# Patient Record
Sex: Male | Born: 1945 | Race: Black or African American | Hispanic: No | Marital: Single | Smoking: Current every day smoker
Health system: Southern US, Community
[De-identification: ages and names within clinical notes are randomized; demographics above are authoritative.]

## PROBLEM LIST (undated history)

## (undated) DIAGNOSIS — I82409 Acute embolism and thrombosis of unspecified deep veins of unspecified lower extremity: Secondary | ICD-10-CM

## (undated) DIAGNOSIS — I5022 Chronic systolic (congestive) heart failure: Secondary | ICD-10-CM

## (undated) DIAGNOSIS — M1990 Unspecified osteoarthritis, unspecified site: Secondary | ICD-10-CM

## (undated) DIAGNOSIS — Z95810 Presence of automatic (implantable) cardiac defibrillator: Secondary | ICD-10-CM

## (undated) DIAGNOSIS — I219 Acute myocardial infarction, unspecified: Secondary | ICD-10-CM

## (undated) HISTORY — PX: KNEE SURGERY: SHX244

---

## 2015-11-04 DIAGNOSIS — I428 Other cardiomyopathies: Secondary | ICD-10-CM

## 2015-11-04 HISTORY — DX: Other cardiomyopathies: I42.8

## 2015-11-04 HISTORY — PX: CARDIAC CATHETERIZATION: SHX172

## 2017-04-09 ENCOUNTER — Emergency Department (HOSPITAL_COMMUNITY): Payer: Medicare Other

## 2017-04-09 ENCOUNTER — Encounter (HOSPITAL_COMMUNITY): Payer: Self-pay | Admitting: Emergency Medicine

## 2017-04-09 ENCOUNTER — Emergency Department (HOSPITAL_COMMUNITY)
Admission: EM | Admit: 2017-04-09 | Discharge: 2017-04-10 | Disposition: A | Discharge status: Home / Self Care | Payer: Medicare Other | Attending: Emergency Medicine | Admitting: Emergency Medicine

## 2017-04-09 DIAGNOSIS — J449 Chronic obstructive pulmonary disease, unspecified: Secondary | ICD-10-CM

## 2017-04-09 DIAGNOSIS — E876 Hypokalemia: Secondary | ICD-10-CM | POA: Diagnosis not present

## 2017-04-09 DIAGNOSIS — F17210 Nicotine dependence, cigarettes, uncomplicated: Secondary | ICD-10-CM | POA: Diagnosis not present

## 2017-04-09 DIAGNOSIS — R05 Cough: Secondary | ICD-10-CM | POA: Diagnosis present

## 2017-04-09 DIAGNOSIS — Z7982 Long term (current) use of aspirin: Secondary | ICD-10-CM | POA: Diagnosis not present

## 2017-04-09 DIAGNOSIS — R059 Cough, unspecified: Secondary | ICD-10-CM

## 2017-04-09 HISTORY — DX: Acute myocardial infarction, unspecified: I21.9

## 2017-04-09 HISTORY — DX: Unspecified osteoarthritis, unspecified site: M19.90

## 2017-04-09 LAB — COMPREHENSIVE METABOLIC PANEL
ALBUMIN: 4.1 g/dL (ref 3.5–5.0)
ALT: 11 U/L — ABNORMAL LOW (ref 17–63)
AST: 14 U/L — AB (ref 15–41)
Alkaline Phosphatase: 72 U/L (ref 38–126)
Anion gap: 8 (ref 5–15)
BUN: 10 mg/dL (ref 6–20)
CHLORIDE: 101 mmol/L (ref 101–111)
CO2: 27 mmol/L (ref 22–32)
Calcium: 9.1 mg/dL (ref 8.9–10.3)
Creatinine, Ser: 1.29 mg/dL — ABNORMAL HIGH (ref 0.61–1.24)
GFR calc Af Amer: >60 mL/min (ref >60)
GFR calc non Af Amer: 55 mL/min — ABNORMAL LOW (ref >60)
GLUCOSE: 100 mg/dL — AB (ref 65–99)
POTASSIUM: 3.3 mmol/L — AB (ref 3.5–5.1)
Sodium: 136 mmol/L (ref 135–145)
Total Bilirubin: 0.5 mg/dL (ref 0.3–1.2)
Total Protein: 7.3 g/dL (ref 6.5–8.1)

## 2017-04-09 LAB — DIFFERENTIAL
BASOS PCT: 0 %
Basophils Absolute: 0 10*3/uL (ref 0.0–0.1)
EOS ABS: 0.2 10*3/uL (ref 0.0–0.7)
Eosinophils Relative: 3 %
Lymphocytes Relative: 35 %
Lymphs Abs: 2.4 10*3/uL (ref 0.7–4.0)
MONOS PCT: 10 %
Monocytes Absolute: 0.7 10*3/uL (ref 0.1–1.0)
NEUTROS ABS: 3.6 10*3/uL (ref 1.7–7.7)
Neutrophils Relative %: 52 %

## 2017-04-09 LAB — CBC
HEMATOCRIT: 39.5 % (ref 39.0–52.0)
Hemoglobin: 13.7 g/dL (ref 13.0–17.0)
MCH: 28.2 pg (ref 26.0–34.0)
MCHC: 34.7 g/dL (ref 30.0–36.0)
MCV: 81.4 fL (ref 78.0–100.0)
Platelets: 241 10*3/uL (ref 150–400)
RBC: 4.85 MIL/uL (ref 4.22–5.81)
RDW: 13.4 % (ref 11.5–15.5)
WBC: 6.6 10*3/uL (ref 4.0–10.5)

## 2017-04-09 LAB — LIPASE, BLOOD: LIPASE: 38 U/L (ref 11–51)

## 2017-04-09 MED ORDER — POTASSIUM CHLORIDE CRYS ER 20 MEQ PO TBCR
40.0000 meq | EXTENDED_RELEASE_TABLET | Freq: Once | ORAL | Status: AC
  Administered 2017-04-10: 40 meq via ORAL
  Filled 2017-04-09: qty 2

## 2017-04-09 MED ORDER — IPRATROPIUM-ALBUTEROL 0.5-2.5 (3) MG/3ML IN SOLN
3.0000 mL | Freq: Once | RESPIRATORY_TRACT | Status: AC
Start: 2017-04-09 — End: 2017-04-10
  Administered 2017-04-10: 3 mL via RESPIRATORY_TRACT
  Filled 2017-04-09: qty 3

## 2017-04-09 NOTE — ED Triage Notes (Signed)
Patient complaining of a cough that started 2 weeks ago. Patient states he had a heart attack three months ago but has not went back to the MD to get results. Patient is also complaining of abdominal pain that started after the cough.

## 2017-04-09 NOTE — ED Provider Notes (Signed)
[facility]-EMERGENCY DEPT Provider Note   CSN: [id number] Arrival date & time: 04/09/17  2119   By signing my name below, I, [name], attest that this documentation has been prepared under the direction and in the presence of [name], MD. Electronically signed, [name], ED Scribe. 04/09/17. 11:36 PM.   History   Chief Complaint Chief Complaint  Patient presents with  . Cough  . Abdominal Pain   The history is provided by the patient and medical records. No language interpreter was used.    Brandon Frye is a 71 y.o. male with h/o heart attack presenting to the Emergency Department with chief complaint of a gradually worsening, heavy, productive cough x ~3 months. He states his coughing spells have led to light-headedness, headaches, SOB and near syncope. He also notes lower abdominal pain, he states his cough wakes him at night and he reports a "scratch" sensation to the back of the throat. Pt has used an inhaler and cough syrup without relief to cough. Heart catheterization noted at [facility] in 10/2017. Pt placed on lisinopril on 02/11/2017. No fever, chills, sweats, nausea or vomiting noted. Pt followed at [facility] in [place] for primary care; he notes an upcoming F/U appointment in 06/2017.  Past Medical History:  Diagnosis Date  . Arthritis   . Heart attack (HCC)     There are no active problems to display for this patient.   History reviewed. No pertinent surgical history.     Home Medications    Prior to Admission medications   Medication Sig Start Date End Date Taking? Authorizing Provider  aspirin EC 81 MG tablet Take 81 mg by mouth daily.   Yes [provider]  atorvastatin (LIPITOR) 40 MG tablet Take 40 mg by mouth at bedtime.   Yes [provider]  carvedilol (COREG) 12.5 MG tablet Take 6.25 mg by mouth 2 (two) times daily with a meal.   Yes [provider]  dextromethorphan (DELSYM) 30 MG/5ML liquid Take 30 mg by mouth 2 (two)  times daily as needed for cough.   Yes [provider]  guaiFENesin (MUCINEX) 600 MG 12 hr tablet Take 600 mg by mouth 2 (two) times daily.   Yes [provider]  Ipratropium-Albuterol (COMBIVENT RESPIMAT) 20-100 MCG/ACT AERS respimat Inhale 1 puff into the lungs every 6 (six) hours as needed for wheezing or shortness of breath.   Yes [provider]  lisinopril (PRINIVIL,ZESTRIL) 5 MG tablet Take 5 mg by mouth daily.   Yes [provider]  loperamide (IMODIUM A-D) 2 MG tablet Take 2 mg by mouth 4 (four) times daily as needed for diarrhea or loose stools.   Yes [provider]  loratadine (CLARITIN) 10 MG tablet Take 10 mg by mouth daily as needed for allergies.   Yes [provider]  naproxen sodium (ANAPROX) 220 MG tablet Take 220 mg by mouth 2 (two) times daily as needed (pain).   Yes [provider]  nitroGLYCERIN (NITROSTAT) 0.4 MG SL tablet Place 0.4 mg under the tongue every 5 (five) minutes as needed for chest pain.   Yes [provider]    Family History History reviewed. No pertinent family history.  Social History Social History  Substance Use Topics  . Smoking status: Current Every Day Smoker    Packs/day: 1.00    Years: 25.00    Types: Cigarettes  . Smokeless tobacco: Never Used  . Alcohol use No     Allergies   Patient has  no known allergies.   Review of Systems Review of Systems  Constitutional: Negative for chills, diaphoresis and fever.  Respiratory: Positive for cough and shortness of breath. Negative for wheezing.   Gastrointestinal: Positive for abdominal pain. Negative for nausea and vomiting.  Neurological: Positive for light-headedness and headaches.  All other systems reviewed and are negative.    Physical Exam Updated Vital Signs BP 92/63 (BP Location: Left Arm)   Pulse 82   Temp 98.2 F (36.8 C) (Oral)   Resp 18   Ht 6\' 1"  (1.854 m)   Wt 185 lb (83.9 kg)   SpO2 95%   BMI  24.41 kg/m   Physical Exam  Constitutional: He is oriented to person, place, and time. He appears well-developed and well-nourished.  HENT:  Head: Normocephalic and atraumatic.  Eyes: EOM are normal. Pupils are equal, round, and reactive to light.  Neck: Normal range of motion. Neck supple. No JVD present.  Cardiovascular: Normal rate, regular rhythm and normal heart sounds.   No murmur heard. Pulmonary/Chest: Effort normal and breath sounds normal. He has no wheezes. He has no rales. He exhibits no tenderness.  Course expiratory rhonchi diffusely  Abdominal: Soft. Bowel sounds are normal. He exhibits no distension and no mass. There is no tenderness.  Musculoskeletal: Normal range of motion. He exhibits no edema.  Lymphadenopathy:    He has no cervical adenopathy.  Neurological: He is alert and oriented to person, place, and time. No cranial nerve deficit. He exhibits normal muscle tone. Coordination normal.  Skin: Skin is warm and dry. No rash noted.  Psychiatric: He has a normal mood and affect. His behavior is normal. Judgment and thought content normal.  Nursing note and vitals reviewed.    ED Treatments / Results  DIAGNOSTIC STUDIES: Oxygen Saturation is 95% on RA, adequate by my interpretation.    COORDINATION OF CARE: 11:32 PM-Discussed next steps with pt. Pt verbalized understanding and is agreeable with the plan. Will order potassium, breathing treatment and CXR. Pt advised of plan to change HTN medication and F/U with PCP.   Labs (all labs ordered are listed, but only abnormal results are displayed) Labs Reviewed  COMPREHENSIVE METABOLIC PANEL - Abnormal; Notable for the following:       Result Value   Potassium 3.3 (*)    Glucose, Bld 100 (*)    Creatinine, Ser 1.29 (*)    AST 14 (*)    ALT 11 (*)    GFR calc non Af Amer 55 (*)    All other components within normal limits  URINALYSIS, ROUTINE W REFLEX MICROSCOPIC - Abnormal; Notable for the following:     Squamous Epithelial / LPF 0-5 (*)    All other components within normal limits  LIPASE, BLOOD  CBC  DIFFERENTIAL    Radiology Dg Chest 2 View  Result Date: 04/09/2017 CLINICAL DATA:  Cough that started 2 weeks ago EXAM: CHEST  2 VIEW COMPARISON:  None. FINDINGS: Linear scarring or atelectasis at the bases. No focal consolidation. Probable pleural scarring at the left base. Normal heart size. Aortic atherosclerosis. No pneumothorax. Degenerative changes of the spine. IMPRESSION: Probable pleural scarring at the left lung base. No acute infiltrate. Electronically Signed   By: Jasmine Pang M.D.   On: 04/09/2017 23:58    Procedures Procedures (including critical care time)  Medications Ordered in ED Medications  predniSONE (DELTASONE) tablet 60 mg (not administered)  potassium chloride SA (K-DUR,KLOR-CON) CR tablet 40 mEq (40 mEq Oral Given 04/10/17  0010)  ipratropium-albuterol (DUONEB) 0.5-2.5 (3) MG/3ML nebulizer solution 3 mL (3 mLs Nebulization Given 04/10/17 0010)     Initial Impression / Assessment and Plan / ED Course  I have reviewed the triage vital signs and the nursing notes.  Pertinent labs & imaging results that were available during my care of the patient were reviewed by me and considered in my medical decision making (see chart for details).  Cough which may be from ACE inhibitor use. Patient thinks that he started taking lisinopril in April. However, on review of his prior records, [facility] note from a cardiac catheterization in December and lisinopril is listed on his medications at that time. However, it still may be the cause of his cough. Chest x-ray shows no evidence of pneumonia. He did have some rhonchi on exam. He was given albuterol with ipratropium via nebulizer and he states his lungs feel less tight, but he is still coughing. He does admit to continued smoking. I will put him on a short course of prednisone. He states he needs a new prescription for Combivent inhaler, and  that is given. He is also given a prescription for a small number of hydrocodone-acetaminophen tablets to use to suppress cough at bedtime. He is instructed to stop taking lisinopril and follow-up with PCP in one week. At that time, responds to medication adjustment can be assessed.  Final Clinical Impressions(s) / ED Diagnoses   Final diagnoses:  Cough  Chronic obstructive pulmonary disease, unspecified COPD type (HCC)  Hypokalemia    New Prescriptions New Prescriptions   HYDROCODONE-ACETAMINOPHEN (NORCO) 5-325 MG TABLET    Take 1 tablet by mouth every 4 (four) hours as needed for moderate pain (or coughing).   PREDNISONE (DELTASONE) 50 MG TABLET    Take 1 tablet (50 mg total) by mouth daily.   I personally performed the services described in this documentation, which was scribed in my presence. The recorded information has been reviewed and is accurate.       [name], MD 04/10/17 0111

## 2017-04-09 NOTE — ED Notes (Signed)
Pt is  Alert and oriented x 4 and is verbally responsive Pt reports RUQ/LUQ discomfort that worsens with recent persistent cough pt describes the pain as tightness. Pt reports having the cough since April, pt states that he has had increase dizziness and feels with some coughing episodes that he may pass out,  and reports recent cardiac events and was concerned the effects of his coughing on his heart.

## 2017-04-10 LAB — URINALYSIS, ROUTINE W REFLEX MICROSCOPIC
BILIRUBIN URINE: NEGATIVE
Bacteria, UA: NONE SEEN
Glucose, UA: NEGATIVE mg/dL
HGB URINE DIPSTICK: NEGATIVE
Ketones, ur: NEGATIVE mg/dL
Leukocytes, UA: NEGATIVE
NITRITE: NEGATIVE
PH: 5 (ref 5.0–8.0)
Protein, ur: NEGATIVE mg/dL
SPECIFIC GRAVITY, URINE: 1.005 (ref 1.005–1.030)

## 2017-04-10 MED ORDER — IPRATROPIUM-ALBUTEROL 20-100 MCG/ACT IN AERS: 1.0000 | INHALATION_SPRAY | Freq: Four times a day (QID) | RESPIRATORY_TRACT | 0 refills | Status: DC | PRN

## 2017-04-10 MED ORDER — PREDNISONE 20 MG PO TABS
60.0000 mg | ORAL_TABLET | Freq: Once | ORAL | Status: AC
  Administered 2017-04-10: 60 mg via ORAL
  Filled 2017-04-10: qty 3

## 2017-04-10 MED ORDER — HYDROCODONE-ACETAMINOPHEN 5-325 MG PO TABS: 1.0000 | ORAL_TABLET | ORAL | 0 refills | Status: DC | PRN

## 2017-04-10 MED ORDER — PREDNISONE 50 MG PO TABS: 50.0000 mg | ORAL_TABLET | Freq: Every day | ORAL | 0 refills | Status: DC

## 2017-04-10 NOTE — Discharge Instructions (Signed)
Stop taking Lisinopril - it may be causing your cough. If your cough gets better, that is what is causing your cough. If your cough does not get better after being off of it for a week, then something else is causing it. Either way, you need to talk with your primary care provider in about a week to discuss your medications.

## 2019-08-19 ENCOUNTER — Encounter (HOSPITAL_COMMUNITY): Payer: Self-pay | Admitting: Emergency Medicine

## 2019-08-19 ENCOUNTER — Emergency Department (HOSPITAL_COMMUNITY)
Admission: EM | Admit: 2019-08-19 | Discharge: 2019-08-19 | Disposition: A | Discharge status: Home / Self Care | Payer: Medicare PPO | Attending: Emergency Medicine | Admitting: Emergency Medicine

## 2019-08-19 ENCOUNTER — Other Ambulatory Visit: Payer: Self-pay

## 2019-08-19 ENCOUNTER — Emergency Department (HOSPITAL_COMMUNITY): Payer: Medicare PPO

## 2019-08-19 DIAGNOSIS — R0600 Dyspnea, unspecified: Secondary | ICD-10-CM | POA: Diagnosis not present

## 2019-08-19 DIAGNOSIS — Z86718 Personal history of other venous thrombosis and embolism: Secondary | ICD-10-CM | POA: Diagnosis not present

## 2019-08-19 DIAGNOSIS — I10 Essential (primary) hypertension: Secondary | ICD-10-CM

## 2019-08-19 DIAGNOSIS — R600 Localized edema: Secondary | ICD-10-CM | POA: Diagnosis not present

## 2019-08-19 DIAGNOSIS — Z7982 Long term (current) use of aspirin: Secondary | ICD-10-CM | POA: Insufficient documentation

## 2019-08-19 DIAGNOSIS — I252 Old myocardial infarction: Secondary | ICD-10-CM | POA: Insufficient documentation

## 2019-08-19 DIAGNOSIS — Z7901 Long term (current) use of anticoagulants: Secondary | ICD-10-CM | POA: Insufficient documentation

## 2019-08-19 DIAGNOSIS — F17210 Nicotine dependence, cigarettes, uncomplicated: Secondary | ICD-10-CM | POA: Insufficient documentation

## 2019-08-19 DIAGNOSIS — Z79899 Other long term (current) drug therapy: Secondary | ICD-10-CM | POA: Diagnosis not present

## 2019-08-19 DIAGNOSIS — Z20828 Contact with and (suspected) exposure to other viral communicable diseases: Secondary | ICD-10-CM | POA: Diagnosis not present

## 2019-08-19 LAB — CBC WITH DIFFERENTIAL/PLATELET
Abs Immature Granulocytes: 0.02 10*3/uL (ref 0.00–0.07)
Basophils Absolute: 0 10*3/uL (ref 0.0–0.1)
Basophils Relative: 1 %
Eosinophils Absolute: 0.2 10*3/uL (ref 0.0–0.5)
Eosinophils Relative: 3 %
HCT: 53.7 % — ABNORMAL HIGH (ref 39.0–52.0)
Hemoglobin: 17.5 g/dL — ABNORMAL HIGH (ref 13.0–17.0)
Immature Granulocytes: 0 %
Lymphocytes Relative: 21 %
Lymphs Abs: 1.1 10*3/uL (ref 0.7–4.0)
MCH: 30.1 pg (ref 26.0–34.0)
MCHC: 32.6 g/dL (ref 30.0–36.0)
MCV: 92.4 fL (ref 80.0–100.0)
Monocytes Absolute: 0.6 10*3/uL (ref 0.1–1.0)
Monocytes Relative: 12 %
Neutro Abs: 3.3 10*3/uL (ref 1.7–7.7)
Neutrophils Relative %: 63 %
Platelets: 242 10*3/uL (ref 150–400)
RBC: 5.81 MIL/uL (ref 4.22–5.81)
RDW: 15.1 % (ref 11.5–15.5)
WBC: 5.2 10*3/uL (ref 4.0–10.5)
nRBC: 0 % (ref 0.0–0.2)

## 2019-08-19 LAB — I-STAT CHEM 8, ED
BUN: 11 mg/dL (ref 8–23)
Calcium, Ion: 1.13 mmol/L — ABNORMAL LOW (ref 1.15–1.40)
Chloride: 102 mmol/L (ref 98–111)
Creatinine, Ser: 1 mg/dL (ref 0.61–1.24)
Glucose, Bld: 128 mg/dL — ABNORMAL HIGH (ref 70–99)
HCT: 56 % — ABNORMAL HIGH (ref 39.0–52.0)
Hemoglobin: 19 g/dL — ABNORMAL HIGH (ref 13.0–17.0)
Potassium: 3.9 mmol/L (ref 3.5–5.1)
Sodium: 141 mmol/L (ref 135–145)
TCO2: 29 mmol/L (ref 22–32)

## 2019-08-19 LAB — LIPID PANEL
Cholesterol: 176 mg/dL (ref 0–200)
HDL: 76 mg/dL (ref >40)
LDL Cholesterol: 86 mg/dL (ref 0–99)
Total CHOL/HDL Ratio: 2.3 RATIO
Triglycerides: 71 mg/dL (ref <150)
VLDL: 14 mg/dL (ref 0–40)

## 2019-08-19 LAB — COMPREHENSIVE METABOLIC PANEL
ALT: 32 U/L (ref 0–44)
AST: 42 U/L — ABNORMAL HIGH (ref 15–41)
Albumin: 4 g/dL (ref 3.5–5.0)
Alkaline Phosphatase: 77 U/L (ref 38–126)
Anion gap: 10 (ref 5–15)
BUN: 9 mg/dL (ref 8–23)
CO2: 26 mmol/L (ref 22–32)
Calcium: 8.8 mg/dL — ABNORMAL LOW (ref 8.9–10.3)
Chloride: 103 mmol/L (ref 98–111)
Creatinine, Ser: 1.07 mg/dL (ref 0.61–1.24)
GFR calc Af Amer: >60 mL/min (ref >60)
GFR calc non Af Amer: >60 mL/min (ref >60)
Glucose, Bld: 130 mg/dL — ABNORMAL HIGH (ref 70–99)
Potassium: 3.9 mmol/L (ref 3.5–5.1)
Sodium: 139 mmol/L (ref 135–145)
Total Bilirubin: 0.8 mg/dL (ref 0.3–1.2)
Total Protein: 7 g/dL (ref 6.5–8.1)

## 2019-08-19 LAB — SARS CORONAVIRUS 2 BY RT PCR (HOSPITAL ORDER, PERFORMED IN ~~LOC~~ HOSPITAL LAB): SARS Coronavirus 2: NEGATIVE

## 2019-08-19 LAB — APTT: aPTT: 45 seconds — ABNORMAL HIGH (ref 24–36)

## 2019-08-19 LAB — TROPONIN I (HIGH SENSITIVITY)
Troponin I (High Sensitivity): 23 ng/L — ABNORMAL HIGH (ref <18)
Troponin I (High Sensitivity): 21 ng/L — ABNORMAL HIGH (ref <18)

## 2019-08-19 LAB — PROTIME-INR
INR: 2 — ABNORMAL HIGH (ref 0.8–1.2)
Prothrombin Time: 22.2 seconds — ABNORMAL HIGH (ref 11.4–15.2)

## 2019-08-19 SURGERY — CORONARY/GRAFT ACUTE MI REVASCULARIZATION
Anesthesia: LOCAL

## 2019-08-19 MED ORDER — ASPIRIN 81 MG PO CHEW
324.0000 mg | CHEWABLE_TABLET | Freq: Once | ORAL | Status: AC
  Administered 2019-08-19: 324 mg via ORAL

## 2019-08-19 MED ORDER — IOHEXOL 350 MG/ML SOLN
100.0000 mL | Freq: Once | INTRAVENOUS | Status: AC | PRN
  Administered 2019-08-19: 100 mL via INTRAVENOUS

## 2019-08-19 MED ORDER — HEPARIN (PORCINE) 25000 UT/250ML-% IV SOLN
1000.0000 [IU]/h | INTRAVENOUS | Status: DC
  Filled 2019-08-19 (×2): qty 250

## 2019-08-19 MED ORDER — HEPARIN SODIUM (PORCINE) 5000 UNIT/ML IJ SOLN: 60.0000 [IU]/kg | Freq: Once | INTRAMUSCULAR | Status: DC

## 2019-08-19 MED ORDER — METOPROLOL TARTRATE 5 MG/5ML IV SOLN
5.0000 mg | Freq: Once | INTRAVENOUS | Status: AC
  Administered 2019-08-19: 13:00:00 5 mg via INTRAVENOUS
  Filled 2019-08-19: qty 5

## 2019-08-19 MED ORDER — SODIUM CHLORIDE 0.9 % IV SOLN
INTRAVENOUS | Status: DC
  Administered 2019-08-19: 10:00:00 via INTRAVENOUS

## 2019-08-19 MED ORDER — ASPIRIN 81 MG PO CHEW
CHEWABLE_TABLET | ORAL | Status: AC
  Filled 2019-08-19: qty 3

## 2019-08-19 MED ORDER — ASPIRIN 81 MG PO CHEW: 324.0000 mg | CHEWABLE_TABLET | Freq: Once | ORAL | Status: DC

## 2019-08-19 NOTE — ED Notes (Signed)
Pt stood and used urinal at bedside. Pt endorses shob by the time he was finished urinating, helped back into bed.

## 2019-08-19 NOTE — ED Provider Notes (Signed)
[facility] EMERGENCY DEPARTMENT Provider Note   CSN: [id number] Arrival date & time: 08/19/19  5916     History   Chief Complaint Chief Complaint  Patient presents with  . Code STEMI    HPI Brandon Frye is a 73 y.o. male.     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  Denies symptoms prior to going to bed.  HO MI several years ago in [place]- states he did not know he was having an MI but was told he had one.  He noted some dyspnea at rest at 3 AM.  However, when he got up to go to the bathroom it was worsening.  He denies ever having any similar symptoms in the past.  He has ongoing left lower extremity swelling with dilation of the veins and states unchanged from prior.  He reports taking his Xarelto as prescribed.  Past Medical History:  Diagnosis Date  . Arthritis   . Heart attack ([place])     There are no active problems to display for this patient.   History reviewed. No pertinent surgical history.      Home Medications    Prior to Admission medications   Medication Sig Start Date End Date Taking? Authorizing Provider  aspirin EC 81 MG tablet Take 81 mg by mouth daily.    [provider]  atorvastatin (LIPITOR) 40 MG tablet Take 40 mg by mouth at bedtime.    [provider]  carvedilol (COREG) 12.5 MG tablet Take 6.25 mg by mouth 2 (two) times daily with a meal.    [provider]  dextromethorphan (DELSYM) 30 MG/5ML liquid Take 30 mg by mouth 2 (two) times daily as needed for cough.    [provider]  guaiFENesin (MUCINEX) 600 MG 12 hr tablet Take 600 mg by mouth 2 (two) times daily.    [provider]  HYDROcodone-acetaminophen (NORCO) 5-325 MG tablet Take 1 tablet by mouth every 4 (four) hours as needed for moderate pain (or coughing). 01/08/45   Delora Fuel, MD  Ipratropium-Albuterol (COMBIVENT RESPIMAT) 20-100 MCG/ACT AERS respimat Inhale 1 puff into the lungs  every 6 (six) hours as needed for wheezing or shortness of breath (or coughing). 04/07/98   Delora Fuel, MD  loperamide (IMODIUM A-D) 2 MG tablet Take 2 mg by mouth 4 (four) times daily as needed for diarrhea or loose stools.    [provider]  loratadine (CLARITIN) 10 MG tablet Take 10 mg by mouth daily as needed for allergies.    [provider]  naproxen sodium (ANAPROX) 220 MG tablet Take 220 mg by mouth 2 (two) times daily as needed (pain).    [provider]  nitroGLYCERIN (NITROSTAT) 0.4 MG SL tablet Place 0.4 mg under the tongue every 5 (five) minutes as needed for chest pain.    [provider]  predniSONE (DELTASONE) 50 MG tablet Take 1 tablet (50 mg total) by mouth daily. 01/05/69   Delora Fuel, MD    Family History No family history on file.  Social History Social History   Tobacco Use  . Smoking status: Current Every Day Smoker    Packs/day: 1.00    Years: 25.00    Pack years: 25.00    Types: Cigarettes  . Smokeless tobacco: Never Used  Substance Use Topics  . Alcohol use: No  . Drug use: No     Allergies   Patient has no known  allergies.   Review of Systems Review of Systems  All other systems reviewed and are negative.    Physical Exam Updated Vital Signs BP (!) 182/118   Pulse (!) 102   Temp 98.6 F (37 C)   Resp 20   SpO2 98%   Physical Exam Vitals signs and nursing note reviewed.  Constitutional:      General: He is not in acute distress.    Appearance: Normal appearance. He is normal weight.  HENT:     Right Ear: External ear normal.     Left Ear: External ear normal.     Nose: Nose normal.     Mouth/Throat:     Pharynx: Oropharynx is clear.  Eyes:     Pupils: Pupils are equal, round, and reactive to light.  Neck:     Musculoskeletal: Normal range of motion.  Cardiovascular:     Rate and Rhythm: Normal rate and regular rhythm.  Pulmonary:     Effort: Pulmonary effort is normal.     Breath sounds:  Normal breath sounds.  Abdominal:     General: Abdomen is flat.     Palpations: Abdomen is soft.  Musculoskeletal: Normal range of motion.        General: Swelling present.     Comments: Left lower extremity with calf swelling relative to the right with prominent veins noted No wounds noted Dorsal pedal is pulses intact  Skin:    General: Skin is dry.     Capillary Refill: Capillary refill takes less than 2 seconds.  Neurological:     General: No focal deficit present.     Mental Status: He is alert.  Psychiatric:        Mood and Affect: Mood normal.      ED Treatments / Results  Labs (all labs ordered are listed, but only abnormal results are displayed) Labs Reviewed  CBC WITH DIFFERENTIAL/PLATELET  COMPREHENSIVE METABOLIC PANEL    EKG EKG Interpretation  Date/Time:  Friday August 19 2019 09:10:40 EDT Ventricular Rate:  93 PR Interval:  154 QRS Duration: 116 QT Interval:  392 QTC Calculation: 487 R Axis:   26 Text Interpretation:  Sinus rhythm with Premature atrial complexes with Abberant conduction Right atrial enlargement Left ventricular hypertrophy with QRS widening ( Sokolow-Lyon , Cornell product ) Prolonged QT Abnormal ECG st elevation v1-v4 likely lvh  cannot rule out stemi- no old to compare Confirmed by Margarita Grizzle [phone]) on 08/19/2019 9:15:04 AM   Radiology Ct Angio Chest Pe W And/or Wo Contrast  Result Date: 08/19/2019 CLINICAL DATA:  Cough, shortness of breath EXAM: CT ANGIOGRAPHY CHEST WITH CONTRAST TECHNIQUE: Multidetector CT imaging of the chest was performed using the standard protocol during bolus administration of intravenous contrast. Multiplanar CT image reconstructions and MIPs were obtained to evaluate the vascular anatomy. CONTRAST:  OMNIPAQUE IOHEXOL 350 MG/ML SOLN COMPARISON:  Chest x-Taniah Reinecke today FINDINGS: Cardiovascular: No filling defects in the pulmonary arteries to suggest pulmonary emboli. Heart is borderline in size. Scattered  coronary artery and aortic calcifications. No aneurysm. Mediastinum/Nodes: No mediastinal, hilar, or axillary adenopathy. Trachea and esophagus are unremarkable. Thyroid unremarkable. Lungs/Pleura: Linear areas of scarring in the lingula and left base. No pleural effusions. No confluent opacities. Upper Abdomen: Imaging into the upper abdomen shows no acute findings. Musculoskeletal: Chest wall soft tissues are unremarkable. No acute bony abnormality. Review of the MIP images confirms the above findings. IMPRESSION: No evidence of pulmonary embolus. Borderline heart size.  Coronary artery disease. Lingular/left  basilar scarring. Aortic Atherosclerosis (ICD10-I70.0). Electronically Signed   By: Charlett Nose M.D.   On: 08/19/2019 12:11   Dg Chest Port 1 View  Result Date: 08/19/2019 CLINICAL DATA:  Shortness of breath EXAM: PORTABLE CHEST 1 VIEW COMPARISON:  April 09, 2017 FINDINGS: There is no edema or consolidation. There is a minimal left pleural effusion. Heart is upper normal in size with pulmonary vascularity normal. No adenopathy. No bone lesions. There is aortic atherosclerosis. IMPRESSION: Minimal left pleural effusion. No edema or consolidation. Heart upper normal in size. Aortic Atherosclerosis (ICD10-I70.0). Electronically Signed   By: Bretta Bang III M.D.   On: 08/19/2019 09:37    Procedures Procedures (including critical care time)  Medications Ordered in ED Medications - No data to display   Initial Impression / Assessment and Plan / ED Course  I have reviewed the triage vital signs and the nursing notes.  Pertinent labs & imaging results that were available during my care of the patient were reviewed by me and considered in my medical decision making (see chart for details).    74 year old male history of coronary disease and DVT yesterday with increased dyspnea over the past 1 to 2 weeks.  EKG initially evaluated at ST elevation without old EKGs.  Initially initiated as a code  STEMI.  Reviewed with Dr. [place] and code STEMI canceled as agreed that it appears to be LVH.  Patient has been pain-free here.  He has been evaluated for CHF, pulmonary embolism, and MI.  There is no evidence of pulmonary embolism.  He does have a mild left pleural effusion noted on chest x-Jakera Beaupre but it is not present on CT angio.  Patient has second troponin pending.  He is hypertensive here in the ED.  Has not taken some medications.  He is given Lopressor IV.  Once repeat troponin has resulted, will ambulate if negative.  Patient stable will consider discharge to home   Patient ambulated without difficulty.  Discussed return precautions and need for follow up. Final Clinical Impressions(s) / ED Diagnoses   Final diagnoses:  Dyspnea, unspecified type  Hypertension, unspecified type    ED Discharge Orders    None       Margarita Grizzle, MD 08/19/19 1343

## 2019-08-19 NOTE — Discharge Instructions (Addendum)
Please return if you are worse at any time. Recheck with your doctor next week.

## 2019-08-19 NOTE — ED Notes (Signed)
Ambulated with pt in hallway, denies sob or cp during ambulation, does not require assistive device or SBA from staff.

## 2019-08-19 NOTE — ED Notes (Signed)
[facility]) called @ 0912-per Dr. Jeanell Sparrow called by Levada Dy

## 2019-08-19 NOTE — ED Triage Notes (Signed)
Pt reports sob that awoke him around 3 am, states he felt fine when he went to bed last night. Reports a productive cough with white phlegm initially. Denies fevers,chills, sore throat or chest pain, denies any recent sick contacts. resp minimally labored.

## 2019-12-21 ENCOUNTER — Emergency Department (HOSPITAL_COMMUNITY): Payer: No Typology Code available for payment source

## 2019-12-21 ENCOUNTER — Encounter (HOSPITAL_COMMUNITY): Payer: Self-pay

## 2019-12-21 ENCOUNTER — Inpatient Hospital Stay (HOSPITAL_COMMUNITY)
Admission: EM | Admit: 2019-12-21 | Discharge: 2019-12-27 | DRG: 265 | Disposition: A | Discharge status: Home / Self Care | Payer: No Typology Code available for payment source | Source: Ambulatory Visit | Attending: Internal Medicine | Admitting: Internal Medicine

## 2019-12-21 ENCOUNTER — Other Ambulatory Visit: Payer: Self-pay

## 2019-12-21 DIAGNOSIS — E876 Hypokalemia: Secondary | ICD-10-CM | POA: Diagnosis present

## 2019-12-21 DIAGNOSIS — I509 Heart failure, unspecified: Secondary | ICD-10-CM | POA: Diagnosis not present

## 2019-12-21 DIAGNOSIS — I472 Ventricular tachycardia: Secondary | ICD-10-CM | POA: Diagnosis present

## 2019-12-21 DIAGNOSIS — I5030 Unspecified diastolic (congestive) heart failure: Secondary | ICD-10-CM

## 2019-12-21 DIAGNOSIS — I4729 Other ventricular tachycardia: Secondary | ICD-10-CM

## 2019-12-21 DIAGNOSIS — I110 Hypertensive heart disease with heart failure: Secondary | ICD-10-CM | POA: Diagnosis not present

## 2019-12-21 DIAGNOSIS — R911 Solitary pulmonary nodule: Secondary | ICD-10-CM | POA: Diagnosis present

## 2019-12-21 DIAGNOSIS — J449 Chronic obstructive pulmonary disease, unspecified: Secondary | ICD-10-CM | POA: Diagnosis present

## 2019-12-21 DIAGNOSIS — Z7952 Long term (current) use of systemic steroids: Secondary | ICD-10-CM

## 2019-12-21 DIAGNOSIS — I429 Cardiomyopathy, unspecified: Secondary | ICD-10-CM

## 2019-12-21 DIAGNOSIS — Z20822 Contact with and (suspected) exposure to covid-19: Secondary | ICD-10-CM | POA: Diagnosis present

## 2019-12-21 DIAGNOSIS — Z7982 Long term (current) use of aspirin: Secondary | ICD-10-CM

## 2019-12-21 DIAGNOSIS — Z8249 Family history of ischemic heart disease and other diseases of the circulatory system: Secondary | ICD-10-CM

## 2019-12-21 DIAGNOSIS — I2510 Atherosclerotic heart disease of native coronary artery without angina pectoris: Secondary | ICD-10-CM | POA: Diagnosis present

## 2019-12-21 DIAGNOSIS — Z823 Family history of stroke: Secondary | ICD-10-CM

## 2019-12-21 DIAGNOSIS — Z79899 Other long term (current) drug therapy: Secondary | ICD-10-CM

## 2019-12-21 DIAGNOSIS — Z959 Presence of cardiac and vascular implant and graft, unspecified: Secondary | ICD-10-CM

## 2019-12-21 DIAGNOSIS — I160 Hypertensive urgency: Secondary | ICD-10-CM | POA: Diagnosis present

## 2019-12-21 DIAGNOSIS — Z7901 Long term (current) use of anticoagulants: Secondary | ICD-10-CM

## 2019-12-21 DIAGNOSIS — R55 Syncope and collapse: Secondary | ICD-10-CM | POA: Diagnosis present

## 2019-12-21 DIAGNOSIS — Z86718 Personal history of other venous thrombosis and embolism: Secondary | ICD-10-CM

## 2019-12-21 DIAGNOSIS — F17210 Nicotine dependence, cigarettes, uncomplicated: Secondary | ICD-10-CM | POA: Diagnosis present

## 2019-12-21 DIAGNOSIS — I5043 Acute on chronic combined systolic (congestive) and diastolic (congestive) heart failure: Secondary | ICD-10-CM

## 2019-12-21 DIAGNOSIS — I5082 Biventricular heart failure: Secondary | ICD-10-CM | POA: Diagnosis present

## 2019-12-21 DIAGNOSIS — M1990 Unspecified osteoarthritis, unspecified site: Secondary | ICD-10-CM | POA: Diagnosis present

## 2019-12-21 HISTORY — DX: Chronic systolic (congestive) heart failure: I50.22

## 2019-12-21 LAB — CBC
HCT: 49.5 % (ref 39.0–52.0)
Hemoglobin: 16.6 g/dL (ref 13.0–17.0)
MCH: 30.2 pg (ref 26.0–34.0)
MCHC: 33.5 g/dL (ref 30.0–36.0)
MCV: 90 fL (ref 80.0–100.0)
Platelets: 194 10*3/uL (ref 150–400)
RBC: 5.5 MIL/uL (ref 4.22–5.81)
RDW: 13.4 % (ref 11.5–15.5)
WBC: 6.1 10*3/uL (ref 4.0–10.5)
nRBC: 0 % (ref 0.0–0.2)

## 2019-12-21 LAB — BASIC METABOLIC PANEL
Anion gap: 10 (ref 5–15)
BUN: 10 mg/dL (ref 8–23)
CO2: 26 mmol/L (ref 22–32)
Calcium: 9.2 mg/dL (ref 8.9–10.3)
Chloride: 104 mmol/L (ref 98–111)
Creatinine, Ser: 1.03 mg/dL (ref 0.61–1.24)
GFR calc Af Amer: >60 mL/min (ref >60)
GFR calc non Af Amer: >60 mL/min (ref >60)
Glucose, Bld: 104 mg/dL — ABNORMAL HIGH (ref 70–99)
Potassium: 3.7 mmol/L (ref 3.5–5.1)
Sodium: 140 mmol/L (ref 135–145)

## 2019-12-21 LAB — TROPONIN I (HIGH SENSITIVITY)
Troponin I (High Sensitivity): 28 ng/L — ABNORMAL HIGH (ref <18)
Troponin I (High Sensitivity): 29 ng/L — ABNORMAL HIGH (ref <18)

## 2019-12-21 MED ORDER — SODIUM CHLORIDE 0.9% FLUSH: 3.0000 mL | Freq: Once | INTRAVENOUS | Status: DC

## 2019-12-21 NOTE — ED Triage Notes (Signed)
Pt presents with SOB x2 weeks, denies CP right now. Pt reports SOB worsens w/exertion and at night. Dr. From [place] sent him here for further evaluation

## 2019-12-22 ENCOUNTER — Emergency Department (HOSPITAL_COMMUNITY): Payer: No Typology Code available for payment source

## 2019-12-22 ENCOUNTER — Observation Stay (HOSPITAL_COMMUNITY): Payer: No Typology Code available for payment source

## 2019-12-22 ENCOUNTER — Encounter (HOSPITAL_COMMUNITY): Payer: Self-pay | Admitting: Radiology

## 2019-12-22 DIAGNOSIS — E876 Hypokalemia: Secondary | ICD-10-CM | POA: Diagnosis present

## 2019-12-22 DIAGNOSIS — I160 Hypertensive urgency: Secondary | ICD-10-CM | POA: Diagnosis present

## 2019-12-22 DIAGNOSIS — I5030 Unspecified diastolic (congestive) heart failure: Secondary | ICD-10-CM

## 2019-12-22 DIAGNOSIS — I472 Ventricular tachycardia: Secondary | ICD-10-CM | POA: Diagnosis present

## 2019-12-22 DIAGNOSIS — I509 Heart failure, unspecified: Secondary | ICD-10-CM | POA: Diagnosis present

## 2019-12-22 DIAGNOSIS — I2510 Atherosclerotic heart disease of native coronary artery without angina pectoris: Secondary | ICD-10-CM | POA: Diagnosis present

## 2019-12-22 DIAGNOSIS — J449 Chronic obstructive pulmonary disease, unspecified: Secondary | ICD-10-CM | POA: Diagnosis present

## 2019-12-22 DIAGNOSIS — I5082 Biventricular heart failure: Secondary | ICD-10-CM | POA: Diagnosis present

## 2019-12-22 DIAGNOSIS — R911 Solitary pulmonary nodule: Secondary | ICD-10-CM | POA: Diagnosis present

## 2019-12-22 DIAGNOSIS — R55 Syncope and collapse: Secondary | ICD-10-CM | POA: Diagnosis present

## 2019-12-22 DIAGNOSIS — Z823 Family history of stroke: Secondary | ICD-10-CM | POA: Diagnosis not present

## 2019-12-22 DIAGNOSIS — M1990 Unspecified osteoarthritis, unspecified site: Secondary | ICD-10-CM | POA: Diagnosis present

## 2019-12-22 DIAGNOSIS — Z8249 Family history of ischemic heart disease and other diseases of the circulatory system: Secondary | ICD-10-CM | POA: Diagnosis not present

## 2019-12-22 DIAGNOSIS — Z7952 Long term (current) use of systemic steroids: Secondary | ICD-10-CM | POA: Diagnosis not present

## 2019-12-22 DIAGNOSIS — I110 Hypertensive heart disease with heart failure: Secondary | ICD-10-CM | POA: Diagnosis present

## 2019-12-22 DIAGNOSIS — I5043 Acute on chronic combined systolic (congestive) and diastolic (congestive) heart failure: Secondary | ICD-10-CM | POA: Diagnosis present

## 2019-12-22 DIAGNOSIS — F17210 Nicotine dependence, cigarettes, uncomplicated: Secondary | ICD-10-CM | POA: Diagnosis present

## 2019-12-22 DIAGNOSIS — Z79899 Other long term (current) drug therapy: Secondary | ICD-10-CM | POA: Diagnosis not present

## 2019-12-22 DIAGNOSIS — Z7982 Long term (current) use of aspirin: Secondary | ICD-10-CM | POA: Diagnosis not present

## 2019-12-22 DIAGNOSIS — Z20822 Contact with and (suspected) exposure to covid-19: Secondary | ICD-10-CM | POA: Diagnosis present

## 2019-12-22 DIAGNOSIS — Z7901 Long term (current) use of anticoagulants: Secondary | ICD-10-CM | POA: Diagnosis not present

## 2019-12-22 DIAGNOSIS — I5023 Acute on chronic systolic (congestive) heart failure: Secondary | ICD-10-CM | POA: Diagnosis not present

## 2019-12-22 DIAGNOSIS — I429 Cardiomyopathy, unspecified: Secondary | ICD-10-CM | POA: Diagnosis not present

## 2019-12-22 DIAGNOSIS — I4729 Other ventricular tachycardia: Secondary | ICD-10-CM

## 2019-12-22 DIAGNOSIS — Z86718 Personal history of other venous thrombosis and embolism: Secondary | ICD-10-CM | POA: Diagnosis not present

## 2019-12-22 LAB — CBC WITH DIFFERENTIAL/PLATELET
Abs Immature Granulocytes: 0.01 10*3/uL (ref 0.00–0.07)
Basophils Absolute: 0 10*3/uL (ref 0.0–0.1)
Basophils Relative: 1 %
Eosinophils Absolute: 0.1 10*3/uL (ref 0.0–0.5)
Eosinophils Relative: 2 %
HCT: 49.3 % (ref 39.0–52.0)
Hemoglobin: 16.4 g/dL (ref 13.0–17.0)
Immature Granulocytes: 0 %
Lymphocytes Relative: 27 %
Lymphs Abs: 1.3 10*3/uL (ref 0.7–4.0)
MCH: 30.2 pg (ref 26.0–34.0)
MCHC: 33.3 g/dL (ref 30.0–36.0)
MCV: 90.8 fL (ref 80.0–100.0)
Monocytes Absolute: 0.7 10*3/uL (ref 0.1–1.0)
Monocytes Relative: 15 %
Neutro Abs: 2.6 10*3/uL (ref 1.7–7.7)
Neutrophils Relative %: 55 %
Platelets: 182 10*3/uL (ref 150–400)
RBC: 5.43 MIL/uL (ref 4.22–5.81)
RDW: 13.5 % (ref 11.5–15.5)
WBC: 4.7 10*3/uL (ref 4.0–10.5)
nRBC: 0 % (ref 0.0–0.2)

## 2019-12-22 LAB — HEPATIC FUNCTION PANEL
ALT: 28 U/L (ref 0–44)
AST: 32 U/L (ref 15–41)
Albumin: 3.1 g/dL — ABNORMAL LOW (ref 3.5–5.0)
Alkaline Phosphatase: 62 U/L (ref 38–126)
Bilirubin, Direct: 0.3 mg/dL — ABNORMAL HIGH (ref 0.0–0.2)
Indirect Bilirubin: 0.9 mg/dL (ref 0.3–0.9)
Total Bilirubin: 1.2 mg/dL (ref 0.3–1.2)
Total Protein: 6.6 g/dL (ref 6.5–8.1)

## 2019-12-22 LAB — MAGNESIUM: Magnesium: 1.5 mg/dL — ABNORMAL LOW (ref 1.7–2.4)

## 2019-12-22 LAB — BASIC METABOLIC PANEL
Anion gap: 9 (ref 5–15)
BUN: 11 mg/dL (ref 8–23)
CO2: 31 mmol/L (ref 22–32)
Calcium: 8.9 mg/dL (ref 8.9–10.3)
Chloride: 99 mmol/L (ref 98–111)
Creatinine, Ser: 1.05 mg/dL (ref 0.61–1.24)
GFR calc Af Amer: >60 mL/min (ref >60)
GFR calc non Af Amer: >60 mL/min (ref >60)
Glucose, Bld: 122 mg/dL — ABNORMAL HIGH (ref 70–99)
Potassium: 3.3 mmol/L — ABNORMAL LOW (ref 3.5–5.1)
Sodium: 139 mmol/L (ref 135–145)

## 2019-12-22 LAB — SARS CORONAVIRUS 2 (TAT 6-24 HRS): SARS Coronavirus 2: NEGATIVE

## 2019-12-22 LAB — BRAIN NATRIURETIC PEPTIDE: B Natriuretic Peptide: 992.9 pg/mL — ABNORMAL HIGH (ref 0.0–100.0)

## 2019-12-22 MED ORDER — RIVAROXABAN 10 MG PO TABS
10.0000 mg | ORAL_TABLET | Freq: Every day | ORAL | Status: DC
  Administered 2019-12-22: 10 mg via ORAL
  Filled 2019-12-22 (×2): qty 1

## 2019-12-22 MED ORDER — POTASSIUM CHLORIDE CRYS ER 20 MEQ PO TBCR
40.0000 meq | EXTENDED_RELEASE_TABLET | Freq: Once | ORAL | Status: AC
  Administered 2019-12-22: 40 meq via ORAL
  Filled 2019-12-22: qty 2

## 2019-12-22 MED ORDER — IPRATROPIUM-ALBUTEROL 0.5-2.5 (3) MG/3ML IN SOLN: 3.0000 mL | Freq: Four times a day (QID) | RESPIRATORY_TRACT | Status: DC | PRN

## 2019-12-22 MED ORDER — PREDNISONE 1 MG PO TABS
4.0000 mg | ORAL_TABLET | Freq: Every day | ORAL | Status: DC
  Administered 2019-12-22 – 2019-12-27 (×5): 4 mg via ORAL
  Filled 2019-12-22 (×6): qty 4

## 2019-12-22 MED ORDER — FUROSEMIDE 10 MG/ML IJ SOLN
40.0000 mg | Freq: Two times a day (BID) | INTRAMUSCULAR | Status: DC
  Administered 2019-12-22 – 2019-12-23 (×2): 40 mg via INTRAVENOUS
  Filled 2019-12-22 (×2): qty 4

## 2019-12-22 MED ORDER — MAGNESIUM SULFATE 4 GM/100ML IV SOLN
4.0000 g | Freq: Once | INTRAVENOUS | Status: AC
  Administered 2019-12-22: 4 g via INTRAVENOUS
  Filled 2019-12-22: qty 100

## 2019-12-22 MED ORDER — SODIUM CHLORIDE 0.9 % IV SOLN: 250.0000 mL | INTRAVENOUS | Status: DC | PRN

## 2019-12-22 MED ORDER — SODIUM CHLORIDE 0.9% FLUSH
3.0000 mL | Freq: Two times a day (BID) | INTRAVENOUS | Status: DC
  Administered 2019-12-22 – 2019-12-26 (×4): 3 mL via INTRAVENOUS

## 2019-12-22 MED ORDER — SODIUM CHLORIDE 0.9 % IV SOLN: INTRAVENOUS | Status: DC

## 2019-12-22 MED ORDER — ASPIRIN 81 MG PO CHEW
81.0000 mg | CHEWABLE_TABLET | ORAL | Status: AC
  Administered 2019-12-23: 81 mg via ORAL
  Filled 2019-12-22: qty 1

## 2019-12-22 MED ORDER — AMIODARONE HCL 200 MG PO TABS
400.0000 mg | ORAL_TABLET | Freq: Two times a day (BID) | ORAL | Status: DC
  Administered 2019-12-22 – 2019-12-23 (×4): 400 mg via ORAL
  Filled 2019-12-22 (×4): qty 2

## 2019-12-22 MED ORDER — CARVEDILOL 6.25 MG PO TABS
6.2500 mg | ORAL_TABLET | Freq: Two times a day (BID) | ORAL | Status: DC
  Administered 2019-12-22 – 2019-12-24 (×6): 6.25 mg via ORAL
  Filled 2019-12-22 (×7): qty 1

## 2019-12-22 MED ORDER — SODIUM CHLORIDE 0.9% FLUSH
3.0000 mL | INTRAVENOUS | Status: DC | PRN
  Administered 2019-12-26: 3 mL via INTRAVENOUS

## 2019-12-22 MED ORDER — GUAIFENESIN ER 600 MG PO TB12
1200.0000 mg | ORAL_TABLET | Freq: Two times a day (BID) | ORAL | Status: DC
  Administered 2019-12-22 – 2019-12-27 (×11): 1200 mg via ORAL
  Filled 2019-12-22 (×11): qty 2

## 2019-12-22 MED ORDER — SPIRONOLACTONE 12.5 MG HALF TABLET
12.5000 mg | ORAL_TABLET | Freq: Every day | ORAL | Status: DC
  Administered 2019-12-22 – 2019-12-24 (×2): 12.5 mg via ORAL
  Filled 2019-12-22 (×2): qty 1

## 2019-12-22 MED ORDER — ONDANSETRON HCL 4 MG/2ML IJ SOLN: 4.0000 mg | Freq: Four times a day (QID) | INTRAMUSCULAR | Status: DC | PRN

## 2019-12-22 MED ORDER — POTASSIUM CHLORIDE CRYS ER 20 MEQ PO TBCR
40.0000 meq | EXTENDED_RELEASE_TABLET | Freq: Two times a day (BID) | ORAL | Status: DC
  Administered 2019-12-22 – 2019-12-23 (×2): 40 meq via ORAL
  Filled 2019-12-22 (×2): qty 2

## 2019-12-22 MED ORDER — ALBUTEROL SULFATE HFA 108 (90 BASE) MCG/ACT IN AERS
4.0000 | INHALATION_SPRAY | Freq: Once | RESPIRATORY_TRACT | Status: AC
  Administered 2019-12-22: 4 via RESPIRATORY_TRACT
  Filled 2019-12-22: qty 6.7

## 2019-12-22 MED ORDER — SODIUM CHLORIDE 0.9% FLUSH: 3.0000 mL | INTRAVENOUS | Status: DC | PRN

## 2019-12-22 MED ORDER — ACETAMINOPHEN 325 MG PO TABS
650.0000 mg | ORAL_TABLET | ORAL | Status: DC | PRN
  Administered 2019-12-26: 650 mg via ORAL
  Filled 2019-12-22: qty 2

## 2019-12-22 MED ORDER — SACUBITRIL-VALSARTAN 24-26 MG PO TABS
1.0000 | ORAL_TABLET | Freq: Two times a day (BID) | ORAL | Status: DC
  Administered 2019-12-22 – 2019-12-27 (×10): 1 via ORAL
  Filled 2019-12-22 (×11): qty 1

## 2019-12-22 MED ORDER — ASPIRIN EC 81 MG PO TBEC
81.0000 mg | DELAYED_RELEASE_TABLET | Freq: Every day | ORAL | Status: DC
  Administered 2019-12-22 – 2019-12-27 (×5): 81 mg via ORAL
  Filled 2019-12-22 (×5): qty 1

## 2019-12-22 MED ORDER — HYDRALAZINE HCL 25 MG PO TABS
25.0000 mg | ORAL_TABLET | Freq: Three times a day (TID) | ORAL | Status: DC
  Administered 2019-12-22 (×2): 25 mg via ORAL
  Filled 2019-12-22 (×2): qty 1

## 2019-12-22 MED ORDER — IOHEXOL 350 MG/ML SOLN
75.0000 mL | Freq: Once | INTRAVENOUS | Status: AC | PRN
  Administered 2019-12-22: 75 mL via INTRAVENOUS

## 2019-12-22 MED ORDER — ENOXAPARIN SODIUM 40 MG/0.4ML ~~LOC~~ SOLN: 40.0000 mg | SUBCUTANEOUS | Status: DC

## 2019-12-22 MED ORDER — SODIUM CHLORIDE 0.9% FLUSH
3.0000 mL | Freq: Two times a day (BID) | INTRAVENOUS | Status: DC
  Administered 2019-12-22 – 2019-12-27 (×6): 3 mL via INTRAVENOUS

## 2019-12-22 MED ORDER — FUROSEMIDE 10 MG/ML IJ SOLN
40.0000 mg | Freq: Once | INTRAMUSCULAR | Status: AC
  Administered 2019-12-22: 40 mg via INTRAVENOUS
  Filled 2019-12-22: qty 4

## 2019-12-22 NOTE — ED Notes (Signed)
Lunch Tray Ordered @ 1049. °

## 2019-12-22 NOTE — Plan of Care (Signed)

## 2019-12-22 NOTE — Consult Note (Addendum)
Cardiology Consultation:   Patient ID: Rudolpho Venturella; [id number]; 03/31/1946   Admit date: 12/21/2019 Date of Consult: 12/22/2019  Primary Care Provider: [facility] Primary Cardiologist: No primary care provider on file. [facility] Primary Electrophysiologist:  None   Patient Profile:   Cyncere Dearcos is a 73 y.o. male with a hx of NICM w/ EF 30% 2017 cath, CHF, COPD, HTN, DVT on Xarelto, who is being seen today for the evaluation of CHF at the request of Dr Thompson.  History of Present Illness:   [facility]  He was having problems with orthopnea and PND.  He was also having some dyspnea on exertion.  He had a syncopal episode several weeks ago.  [facility]  They ordered an echocardiogram, and nuclear stress test, and a 2-week ZIO monitor.  Results are below  [facility] once they reviewed the results of the testing and recommended that he come to the hospital.  He is here today.  His symptoms have been essentially unchanged over the last 2 weeks or so.  About 2 or 3 weeks ago, he was in his kitchen and had a coughing spell.  He woke up on the floor.  As soon as he woke up he knew who and where he was.  There was no incontinence.  He did not have any palpitations and had no awareness of a rapid or irregular heartbeat.  He was not wearing the monitor yet.  The 2-week ZIO monitor showed 40 episodes of VT, the longest one 9.1 seconds.  Mr. Gilchrist does not remember having any palpitations, rapid heartbeats, or feeling lightheaded or dizzy.  He has not had any chest pain.  He denies lower extremity edema.  In discussing why he has trouble sleeping, he states he will go to bed on several pillows, but eventually rolled off the pillows in the night and wake up short of breath.  He has not had any lower extremity edema.  He is not very active, but feels he is not having any trouble walking and doing other  things that he needs to do.  He does not exercise or climb stairs.  He feels he can do less than he could do 6 months or year ago, but no acute changes and no recent change.   Past Medical History:  Diagnosis Date  . Arthritis   . Chronic systolic CHF (congestive heart failure) (HCC)   . Non-ischemic cardiomyopathy (HCC) 2017   clean cath, EF 30%    Past Surgical History:  Procedure Laterality Date  . CARDIAC CATHETERIZATION  2017   at [facility] in [place], clean, EF 30%  . KNEE SURGERY       Prior to Admission medications   Medication Sig Start Date End Date Taking? Authorizing Provider  aspirin EC 81 MG tablet Take 81 mg by mouth daily.   Yes [provider]  carvedilol (COREG) 12.5 MG tablet Take 6.25 mg by mouth 2 (two) times daily with a meal.   Yes [provider]  Ipratropium-Albuterol (COMBIVENT RESPIMAT) 20-100 MCG/ACT AERS respimat Inhale 1 puff into the lungs every 6 (six) hours as needed for wheezing or shortness of breath (or coughing). 04/10/17  Yes Glick, David, MD  nitroGLYCERIN (NITROSTAT) 0.4 MG SL tablet Place 0.4 mg under the tongue every 5 (five) minutes as needed for chest pain.   Yes [provider]  predniSONE (DELTASONE) 50 MG tablet Take   1 tablet (50 mg total) by mouth daily. 04/10/17  Yes Glick, David, MD  rivaroxaban (XARELTO) 10 MG TABS tablet Take 10 mg by mouth daily.   Yes [provider]  HYDROcodone-acetaminophen (NORCO) 5-325 MG tablet Take 1 tablet by mouth every 4 (four) hours as needed for moderate pain (or coughing). Patient not taking: Reported on 12/22/2019 04/10/17   Glick, David, MD    Inpatient Medications: Scheduled Meds: . aspirin EC  81 mg Oral Daily  . carvedilol  6.25 mg Oral BID WC  . furosemide  40 mg Intravenous Q12H  . guaiFENesin  1,200 mg Oral BID  . hydrALAZINE  25 mg Oral Q8H  . predniSONE  4 mg Oral Q breakfast  . rivaroxaban  10 mg Oral Daily  . sodium chloride flush  3 mL Intravenous Once    . sodium chloride flush  3 mL Intravenous Q12H   Continuous Infusions: . sodium chloride    . magnesium sulfate bolus IVPB 4 g (12/22/19 1128)   PRN Meds: sodium chloride, acetaminophen, ipratropium-albuterol, ondansetron (ZOFRAN) IV, sodium chloride flush  Allergies:   No Known Allergies  Social History:   Social History   Socioeconomic History  . Marital status: Single    Spouse name: Not on file  . Number of children: Not on file  . Years of education: Not on file  . Highest education level: Not on file  Occupational History  . Not on file  Tobacco Use  . Smoking status: Current Every Day Smoker    Packs/day: 1.00    Years: 25.00    Pack years: 25.00    Types: Cigarettes  . Smokeless tobacco: Never Used  Substance and Sexual Activity  . Alcohol use: No  . Drug use: No  . Sexual activity: Not on file  Other Topics Concern  . Not on file  Social History Narrative  . Not on file   Social Determinants of Health   Financial Resource Strain:   . Difficulty of Paying Living Expenses: Not on file  Food Insecurity:   . Worried About Running Out of Food in the Last Year: Not on file  . Ran Out of Food in the Last Year: Not on file  Transportation Needs:   . Lack of Transportation (Medical): Not on file  . Lack of Transportation (Non-Medical): Not on file  Physical Activity:   . Days of Exercise per Week: Not on file  . Minutes of Exercise per Session: Not on file  Stress:   . Feeling of Stress : Not on file  Social Connections:   . Frequency of Communication with Friends and Family: Not on file  . Frequency of Social Gatherings with Friends and Family: Not on file  . Attends Religious Services: Not on file  . Active Member of Clubs or Organizations: Not on file  . Attends Club or Organization Meetings: Not on file  . Marital Status: Not on file  Intimate Partner Violence:   . Fear of Current or Ex-Partner: Not on file  . Emotionally Abused: Not on file  .  Physically Abused: Not on file  . Sexually Abused: Not on file    Family History:   Family History  Problem Relation Age of Onset  . Stroke Mother        Multiple strokes, died at 62  . Heart attack Father 41   Family Status:  Family Status  Relation Name Status  . Mother  Deceased  . Father  Deceased      ROS:  Please see the history of present illness.  All other ROS reviewed and negative.     Physical Exam/Data:   Vitals:   12/22/19 1000 12/22/19 1113 12/22/19 1125 12/22/19 1200  BP: 115/86 (!) 112/96 (!) 125/96 (!) 115/91  Pulse: 85 88 (!) 104 84  Resp: 19 (!) 22 (!) 21 20  Temp:      TempSrc:      SpO2: 96% 94% 97% 93%    Intake/Output Summary (Last 24 hours) at 12/22/2019 1230 Last data filed at 12/22/2019 0441 Gross per 24 hour  Intake --  Output 1250 ml  Net -1250 ml   There were no vitals filed for this visit. There is no height or weight on file to calculate BMI.  General:  Well nourished, well developed, male in no acute distress HEENT: normal Lymph: no adenopathy Neck: JVD -10 cm Endocrine:  No thryomegaly Vascular: No carotid bruits; 4/4 extremity pulses 2+  Cardiac:  normal S1, S2; RRR; no murmur Lungs: Decreased breath sounds generally, scattered dry rales bilaterally, no wheezing, rhonchi Abd: soft, nontender, no hepatomegaly  Ext: no edema Musculoskeletal:  No deformities, BUE and BLE strength normal and equal Skin: warm and dry  Neuro:  CNs 2-12 intact, no focal abnormalities noted Psych:  Normal affect   EKG:  The EKG was personally reviewed and demonstrates: 2/17 ECG is sinus tach, heart rate 107, biatrial enlargement, T wave changes in V5-6 are similar to the ones in V6 on the ECG from 08/19/2019, possible lead placement Telemetry:  Telemetry was personally reviewed and demonstrates: Sinus rhythm   CV studies:   ECHO: ordered  ECHO: 12/20/2019 Mod dilated LV w/ EF 25-30%, myocardial strain bullseye imaging>> ?cardiac  amyloid Longitudinal strain -6.7% Indeterminate diastolic function <50% IVC insp collapse>>elevated R atrial pressure PASP est 44 mmHg  NUCLEAR STRESS TEST: 12/20/2019 Tiny fixed apical defect, likely attenuation artifact No ischemia EF 25%  ZIO MONITOR: 2 weeks, ending 12/09/2019 40 episodes VT, longest 9.1 sec, SR, SVT Min HR 42, max HR 285   CATH: 10/22/2016 FINDINGS:  No significant CAD. Mild diffuse disease only throughout the coronary tree. Left main normal. LAD mild diffuse disease including its diagonal and septal branches. Left circumflex mild diffuse disease including its obtuse marginal branches. RCA dominant with a small, mild diffuse disease including its terminal branches. Normal LVEDP, no aortic valve gradient, LV gram: Global hypokinesis, estimated EF 30%     Laboratory Data:   Chemistry Recent Labs  Lab 12/21/19 1834 12/22/19 0909  NA 140 139  K 3.7 3.3*  CL 104 99  CO2 26 31  GLUCOSE 104* 122*  BUN 10 11  CREATININE 1.03 1.05  CALCIUM 9.2 8.9  GFRNONAA >60 >60  GFRAA >60 >60  ANIONGAP 10 9    Lab Results  Component Value Date   ALT 28 12/22/2019   AST 32 12/22/2019   ALKPHOS 62 12/22/2019   BILITOT 1.2 12/22/2019   Hematology Recent Labs  Lab 12/21/19 1834 12/22/19 0909  WBC 6.1 4.7  RBC 5.50 5.43  HGB 16.6 16.4  HCT 49.5 49.3  MCV 90.0 90.8  MCH 30.2 30.2  MCHC 33.5 33.3  RDW 13.4 13.5  PLT 194 182   Cardiac Enzymes High Sensitivity Troponin:   Recent Labs  Lab 12/21/19 1834 12/21/19 2047  TROPONINIHS 28* 29*      BNP Recent Labs  Lab 12/22/19 0127  BNP 992.9*     TSH: No results found for: TSH Lipids:  Lab Results  Component Value Date   CHOL 176 08/19/2019   HDL 76 08/19/2019   LDLCALC 86 08/19/2019   TRIG 71 08/19/2019   CHOLHDL 2.3 08/19/2019   HgbA1c:No results found for: HGBA1C Magnesium:  Magnesium  Date Value Ref Range Status  12/22/2019 1.5 (L) 1.7 - 2.4 mg/dL Final    Comment:    Performed  at [facility], [address], [place], [place] [address]     Radiology/Studies:  DG Chest 2 View  Result Date: 12/21/2019 CLINICAL DATA:  Shortness of breath EXAM: CHEST - 2 VIEW COMPARISON:  08/19/2019 FINDINGS: Chronic bronchitic changes. Hazy right infrahilar atelectasis or mild infiltrate. Stable cardiomediastinal silhouette with aortic atherosclerosis. No pneumothorax. IMPRESSION: 1. Hazy right infrahilar atelectasis or potential developing infiltrate 2. Chronic bronchitic changes Electronically Signed   By: Jasmine Pang M.D.   On: 12/21/2019 19:11   CT Angio Chest PE W and/or Wo Contrast  Result Date: 12/22/2019 CLINICAL DATA:  Shortness of breath. Pulmonary embolism (PE), suspected EXAM: CT ANGIOGRAPHY CHEST WITH CONTRAST TECHNIQUE: Multidetector CT imaging of the chest was performed using the standard protocol during bolus administration of intravenous contrast. Multiplanar CT image reconstructions and MIPs were obtained to evaluate the vascular anatomy. CONTRAST:  29mL OMNIPAQUE IOHEXOL 350 MG/ML SOLN COMPARISON:  Radiograph yesterday. Chest CTA 08/19/2019 FINDINGS: Cardiovascular: Excellent opacification of pulmonary arteries. There are no filling defects within the pulmonary arteries to suggest pulmonary embolus. Mild aortic atherosclerosis. No aortic aneurysm. Cannot assess for dissection given phase of contrast tailored to pulmonary artery evaluation. There are coronary artery calcifications. Multi chamber cardiomegaly. Contrast refluxes into the hepatic veins and IVC. Mediastinum/Nodes: Small mediastinal lymph nodes not enlarged by size criteria. Ill-defined density in the hilar regions likely nodal tissue, not pathologically enlarged. No esophageal wall thickening. No suspicious thyroid nodule. Lungs/Pleura: Severe bronchial thickening which leads to bronchial occlusion in the peripheral branches. Subpleural atelectasis in the left lower lobe. Minimal linear scarring in the left  upper lobe. 7 mm subpleural nodule in the left upper lobe series 6, image 54 is unchanged from prior. No endobronchial lesion. No evidence of pneumonia. Mild emphysema. No pleural fluid. No pulmonary edema. No pulmonary mass. Upper Abdomen: Contrast refluxing into the hepatic veins and IVC. No other acute findings. Musculoskeletal: Multilevel degenerative change in the spine. There are no acute or suspicious osseous abnormalities. Review of the MIP images confirms the above findings. IMPRESSION: 1. No pulmonary embolus. 2. Severe bronchial thickening which leads to bronchial occlusion in the peripheral branches. No evidence of pneumonia. 3. Multi chamber cardiomegaly with reflux of contrast into the hepatic veins and IVC consistent with elevated right heart pressures. Coronary artery calcifications. 4. Unchanged 7 mm subpleural left upper lobe pulmonary nodule from prior exam. Non-contrast chest CT at 6-12 months is recommended. If the nodule is stable at time of repeat CT, then future CT at 18-24 months (from today's scan) is considered optional for low-risk patients, but is recommended for high-risk patients. This recommendation follows the consensus statement: Guidelines for Management of Incidental Pulmonary Nodules Detected on CT Images: From the Fleischner Society 2017; Radiology 2017; 284:228-243. Aortic Atherosclerosis (ICD10-I70.0) and Emphysema (ICD10-J43.9). Electronically Signed   By: Narda Rutherford M.D.   On: 12/22/2019 02:32    Assessment and Plan:   1. Acute on chronic systolic CHF -See echo report above, possible amyloid based on bull's-eye pattern -BNP is elevated, elevated right heart pressures on CT today but no edema -With elevated right heart pressures,  he has been started on Lasix 40 mg IV twice daily -Potassium has been supplemented, continue this.  He has had 40 mEq, give 40 mEq twice daily -Renal function, daily weights, and intake/output. -he has already put out 1250 cc from  his first Lasix dose -Discuss further evaluation and targeted treatment for possible amyloid with MD  2.  Nonsustained VT -Seen on recent monitor, asymptomatic. -Magnesium level today 1.5, supplemented by IM -We will give additional potassium supplementation to get potassium level to 4.0 -Keep on telemetry. -Discuss EP referral for possible ICD with MD  Otherwise, per IM Principal Problem:   Acute on chronic congestive heart failure (HCC) Active Problems:   Cardiomyopathy (HCC)   COPD (chronic obstructive pulmonary disease) (HCC)   Hypertensive urgency   Lung nodule   Arthritis   For questions or updates, please contact [facility] Please consult [email or web address] for contact info under Cardiology/STEMI.   Signed, Theodore Demark, PA-C  12/22/2019 12:30 PM  Patient seen with PA, agree with the above note.   Patient has history of prior DVT, COPD, and HTN.  He also had a nonischemic cardiomyopathy diagnosed in 2017 in [place], [place] was 30% with nonobstructive coronary disease on cath.    Patient now lives in [place] and gets care at the [place].  About 3 weeks ago, he passed out in his house and woke up on the floor.  He was seen at the [place] and had an echo and a 2 week Zio patch. The echo showed EF 25-30% with bullseye strain pattern and dilated IVC.  The Zio patch showed around 40 episodes of NSVT, the longest was 9 seconds.  When the results returned, [facility] told [facility] to [facility] to [facility].   He reports 2-3 weeks of orthopnea, has to sleep on 3-4 pillows.  He is short of breath walking around his house, especially in the morning.  This has been going on for 2-3 weeks as well. No chest pain.  No further syncopal episodes.   General: NAD Neck: JVP 12-14 cm, no thyromegaly or thyroid nodule.  Lungs: Clear to auscultation bilaterally with normal respiratory effort. CV: Nondisplaced PMI.  Heart regular S1/S2, no S3/S4, no murmur.  No peripheral edema.  No carotid bruit.  Normal pedal pulses.   Abdomen: Soft, nontender, no hepatosplenomegaly, no distention.  Skin: Intact without lesions or rashes.  Neurologic: Alert and oriented x 3.  Psych: Normal affect. Extremities: No clubbing or cyanosis.  HEENT: Normal.   1. Acute on chronic systolic CHF: Cath in 2017 showed nonobstructive CAD in setting of EF 30%.  Echo in 2/21 at the [facility] showed EF 25-30% with bullseye pattern on strain imaging concerning for amyloidosis.  No family history of cardiomyopathy but father had MI at age 78.  Increased exertional symptoms as well as orthopnea for 2-3 weeks, no inciting event. HS-TnI in 20s range with no trend.  On exam, he is volume overloaded. BP on the higher side.  - Lasix 40 mg IV bid for now (good response so far, he was not on this at home.  - Continue home Coreg.  - Add spironolactone 12.5 daily.  - Add Entresto 24/26 bid.  - Multiple NSVT runs and presumed VT as cause of syncope, needs ischemic workup.  I will arrange for right/left heart cath to assess for coronary disease and also to look at filling pressures and cardiac output.  - If cath shows no significant CAD, he will need a cardiac MRI to  look for infiltrative disease like amyloidosis (bullseye pattern reported on strain imaging at [place] is concerning).  Will send off myeloma panel and urine immunofixation.  2. Syncope: Multiple NSVT runs on 2 wk Zio patch, up to 9 seconds.  I suspect that the syncopal episode was arrhythmic.   - Start amiodarone 400 mg bid.  - If no significant CAD on cath, will need to talk with EP re: ICD.  - Replace electrolytes.   Loralie Champagne  12/22/2019 2:34 PM

## 2019-12-22 NOTE — ED Notes (Signed)
Pt voided additional 750 cc of clear, yellow urine. Pt transferred to ED Rm 7. Care endorsed to Aimee, RN

## 2019-12-22 NOTE — H&P (Signed)
History and Physical    Brandon Frye AUQ:[id number] DOB: 03-Aug-1946 DOA: 12/21/2019  PCP: [facility]   Patient coming from: Home    Chief Complaint: DOE, orthopnea  HPI: Brandon Frye is a 74 y.o. male with medical history significant for arthritis on chronic prednisone, history of DVT on Xarelto, cardiomyopathy, COPD, and recent former smoker, now presenting for evaluation of approximately 2 weeks of progressive exertional dyspnea with orthopnea.  Patient reports that he has a chronic cough that is unchanged, has not had any fevers or chills, but has developed progressive exertional dyspnea over the past 2 weeks and has also noted the new development of orthopnea.  [facility] states that he had an echocardiogram performed on 12/20/2019, is on sure of the results, but reports being directed to the [facility] emergency department based on the findings.  He denies any chest pain.  ED Course: Upon arrival to the ED, patient is found to be afebrile, saturating low 90s on room air, slightly tachypneic, tachycardic in the low 100s, and with blood pressure 140/105.  EKG features sinus tachycardia with rate 107 and T wave inversions.  Chest x-ray notable for hazy right infrahilar atelectasis or developing infiltrate with chronic bronchitic changes.  CTA chest is negative for PE but notable for severe bronchial thickening with bronchial occlusion in the distal branches, no pneumonia, multichamber cardiomegaly with reflux of contrast into the IVC, and unchanged nodule.  Chemistry panel and CBC are unremarkable.  High-sensitivity troponin is mildly elevated and stable 2 hours later.  BNP was elevated to 993.  COVID-19 screening test is in process.  LFTs are pending.  Patient was treated with albuterol and 40 mg IV Lasix in the ED, has begun to diurese, and hospitalists consulted for admission.  Review of Systems:  All other systems reviewed and apart from HPI, are  negative.  Past Medical History:  Diagnosis Date  . Arthritis   . Heart attack [facility])     History reviewed. No pertinent surgical history.   reports that he has been smoking cigarettes. He has a 25.00 pack-year smoking history. He has never used smokeless tobacco. He reports that he does not drink alcohol or use drugs.  No Known Allergies  History reviewed. No pertinent family history.   Prior to Admission medications   Medication Sig Start Date End Date Taking? Authorizing Provider  aspirin EC 81 MG tablet Take 81 mg by mouth daily.   Yes [provider]  carvedilol (COREG) 12.5 MG tablet Take 6.25 mg by mouth 2 (two) times daily with a meal.   Yes [provider]  Ipratropium-Albuterol (COMBIVENT RESPIMAT) 20-100 MCG/ACT AERS respimat Inhale 1 puff into the lungs every 6 (six) hours as needed for wheezing or shortness of breath (or coughing). 04/10/17  Yes [name], MD  nitroGLYCERIN (NITROSTAT) 0.4 MG SL tablet Place 0.4 mg under the tongue every 5 (five) minutes as needed for chest pain.   Yes [provider]  predniSONE (DELTASONE) 50 MG tablet Take 1 tablet (50 mg total) by mouth daily. 04/10/17  Yes [name], MD  rivaroxaban (XARELTO) 10 MG TABS tablet Take 10 mg by mouth daily.   Yes [provider]  HYDROcodone-acetaminophen (NORCO) 5-325 MG tablet Take 1 tablet by mouth every 4 (four) hours as needed for moderate pain (or coughing). Patient not taking: Reported on 12/22/2019 04/10/17   [name], MD    Physical Exam: Vitals:   12/22/19 0200 12/22/19 6389  12/22/19 0230 12/22/19 0245  BP: (!) 139/102     Pulse: 97 96 93 91  Resp:  20 (!) 25 20  Temp:      TempSrc:      SpO2: 95% 96% 96% 92%     Constitutional: NAD, calm  Eyes: PERTLA, lids and conjunctivae normal ENMT: Mucous membranes are moist. Posterior pharynx clear of any exudate or lesions.   Neck: normal, supple, no masses, no thyromegaly Respiratory: no wheezing, no  crackles. No accessory muscle use.  Cardiovascular: S1 & S2 heard, regular rate and rhythm. No extremity edema.   Abdomen: No distension, no tenderness, soft. Bowel sounds active.  Musculoskeletal: no clubbing / cyanosis. No joint deformity upper and lower extremities.   Skin: no significant rashes, lesions, ulcers. Warm, dry, well-perfused. Neurologic: No facial asymmetry. Sensation intact. Moving all extremities.  Psychiatric: Alert and oriented to person, place, and situation. Very pleasant and cooperative.    Labs and Imaging on Admission: I have personally reviewed following labs and imaging studies  CBC: Recent Labs  Lab 12/21/19 1834  WBC 6.1  HGB 16.6  HCT 49.5  MCV 90.0  PLT 194   Basic Metabolic Panel: Recent Labs  Lab 12/21/19 1834  NA 140  K 3.7  CL 104  CO2 26  GLUCOSE 104*  BUN 10  CREATININE 1.03  CALCIUM 9.2   GFR: CrCl cannot be calculated (Unknown ideal weight.). Liver Function Tests: No results for input(s): AST, ALT, ALKPHOS, BILITOT, PROT, ALBUMIN in the last 168 hours. No results for input(s): LIPASE, AMYLASE in the last 168 hours. No results for input(s): AMMONIA in the last 168 hours. Coagulation Profile: No results for input(s): INR, PROTIME in the last 168 hours. Cardiac Enzymes: No results for input(s): CKTOTAL, CKMB, CKMBINDEX, TROPONINI in the last 168 hours. BNP (last 3 results) No results for input(s): PROBNP in the last 8760 hours. HbA1C: No results for input(s): HGBA1C in the last 72 hours. CBG: No results for input(s): GLUCAP in the last 168 hours. Lipid Profile: No results for input(s): CHOL, HDL, LDLCALC, TRIG, CHOLHDL, LDLDIRECT in the last 72 hours. Thyroid Function Tests: No results for input(s): TSH, T4TOTAL, FREET4, T3FREE, THYROIDAB in the last 72 hours. Anemia Panel: No results for input(s): VITAMINB12, FOLATE, FERRITIN, TIBC, IRON, RETICCTPCT in the last 72 hours. Urine analysis:    Component Value Date/Time    COLORURINE YELLOW 04/09/2017 2147   APPEARANCEUR CLEAR 04/09/2017 2147   LABSPEC 1.005 04/09/2017 2147   PHURINE 5.0 04/09/2017 2147   GLUCOSEU NEGATIVE 04/09/2017 2147   HGBUR NEGATIVE 04/09/2017 2147   BILIRUBINUR NEGATIVE 04/09/2017 2147   KETONESUR NEGATIVE 04/09/2017 2147   PROTEINUR NEGATIVE 04/09/2017 2147   NITRITE NEGATIVE 04/09/2017 2147   LEUKOCYTESUR NEGATIVE 04/09/2017 2147   Sepsis Labs: @LABRCNTIP (procalcitonin:4,lacticidven:4) )No results found for this or any previous visit (from the past 240 hour(s)).   Radiological Exams on Admission: DG Chest 2 View  Result Date: 12/21/2019 CLINICAL DATA:  Shortness of breath EXAM: CHEST - 2 VIEW COMPARISON:  08/19/2019 FINDINGS: Chronic bronchitic changes. Hazy right infrahilar atelectasis or mild infiltrate. Stable cardiomediastinal silhouette with aortic atherosclerosis. No pneumothorax. IMPRESSION: 1. Hazy right infrahilar atelectasis or potential developing infiltrate 2. Chronic bronchitic changes Electronically Signed   By: 08/21/2019 M.D.   On: 12/21/2019 19:11   CT Angio Chest PE W and/or Wo Contrast  Result Date: 12/22/2019 CLINICAL DATA:  Shortness of breath. Pulmonary embolism (PE), suspected EXAM: CT ANGIOGRAPHY CHEST WITH CONTRAST TECHNIQUE: Multidetector CT  imaging of the chest was performed using the standard protocol during bolus administration of intravenous contrast. Multiplanar CT image reconstructions and MIPs were obtained to evaluate the vascular anatomy. CONTRAST:  1mL OMNIPAQUE IOHEXOL 350 MG/ML SOLN COMPARISON:  Radiograph yesterday. Chest CTA 08/19/2019 FINDINGS: Cardiovascular: Excellent opacification of pulmonary arteries. There are no filling defects within the pulmonary arteries to suggest pulmonary embolus. Mild aortic atherosclerosis. No aortic aneurysm. Cannot assess for dissection given phase of contrast tailored to pulmonary artery evaluation. There are coronary artery calcifications. Multi chamber  cardiomegaly. Contrast refluxes into the hepatic veins and IVC. Mediastinum/Nodes: Small mediastinal lymph nodes not enlarged by size criteria. Ill-defined density in the hilar regions likely nodal tissue, not pathologically enlarged. No esophageal wall thickening. No suspicious thyroid nodule. Lungs/Pleura: Severe bronchial thickening which leads to bronchial occlusion in the peripheral branches. Subpleural atelectasis in the left lower lobe. Minimal linear scarring in the left upper lobe. 7 mm subpleural nodule in the left upper lobe series 6, image 54 is unchanged from prior. No endobronchial lesion. No evidence of pneumonia. Mild emphysema. No pleural fluid. No pulmonary edema. No pulmonary mass. Upper Abdomen: Contrast refluxing into the hepatic veins and IVC. No other acute findings. Musculoskeletal: Multilevel degenerative change in the spine. There are no acute or suspicious osseous abnormalities. Review of the MIP images confirms the above findings. IMPRESSION: 1. No pulmonary embolus. 2. Severe bronchial thickening which leads to bronchial occlusion in the peripheral branches. No evidence of pneumonia. 3. Multi chamber cardiomegaly with reflux of contrast into the hepatic veins and IVC consistent with elevated right heart pressures. Coronary artery calcifications. 4. Unchanged 7 mm subpleural left upper lobe pulmonary nodule from prior exam. Non-contrast chest CT at 6-12 months is recommended. If the nodule is stable at time of repeat CT, then future CT at 18-24 months (from today's scan) is considered optional for low-risk patients, but is recommended for high-risk patients. This recommendation follows the consensus statement: Guidelines for Management of Incidental Pulmonary Nodules Detected on CT Images: From the Fleischner Society 2017; Radiology 2017; 284:228-243. Aortic Atherosclerosis (ICD10-I70.0) and Emphysema (ICD10-J43.9). Electronically Signed   By: Narda Rutherford M.D.   On: 12/22/2019 02:32     EKG: Independently reviewed. Sinus tachycardia, rate 107, lateral T-wave inversions.   Assessment/Plan   1. Acute on chronic CHF - Presents with 2 wks of progressive DOE and orthopnea, states he had echocardiogram at [place] on 12/20/19 and does not know results but was sent here for further evaluation based on the findings  - He had left heart cath in December 2017 with documentation of mild diffuse disease, LV EF 30%, and global hypokinesis  - He was given Lasix 40 mg IV in ED and is diuresing well  - Obtain records from [place], continue cardiac monitoring, continue diuresis with Lasix 40 mg IV q12h, continue Coreg, follow daily wt and I/O's    2. COPD  - No wheezing on admission  - Patient recently quit smoking  - Continue Combivent   3. Hypertensive urgency  - DBP persisting >100 in ED  - Anticipate improvement with diuresis   - Continue Coreg, add hydralazine    4. Lung nodule  - Noted incidentally on CTA in ED with follow-up imaging recommended    5. Arthritis  - Patient reports chronic prednisone use for unspecified arthritis, currently on 4 mg daily, will continue   6. History of DVT  - Continue Xarelto    DVT prophylaxis: Lovenox  Code Status: Full  Family  Communication: Discussed with patient  Disposition Plan: Will likely return home within 24-48 hours once dyspnea improved  Consults called: None  Admission status: Observation     Vianne Bulls, MD [facility] Pager: See [email or web address]  If 7AM-7PM, please contact the daytime attending [email or web address]  12/22/2019, 4:15 AM

## 2019-12-22 NOTE — ED Notes (Signed)
Pt voided 250 cc of clear, yellow urine

## 2019-12-22 NOTE — Progress Notes (Addendum)
Progress Note    Brandon Frye  UJW:[id number] DOB: 15-Jan-1946  DOA: 12/21/2019 [facility]CP: [facility]    Brief Narrative:   Chief complaint: home  Medical records reviewed and are as summarized below:  Brandon Frye is a very pleasant 74 y.o. male veteran who served in [place] presented 2/18 after being instructed by cardiology at [facility] to go to ED as result of echo results that are urgent. Hx cardiomyopathy. He had been experiencing worsening sob and orthopnea. He denies CP but endorses worsening DOE and cough with clear sputum production. Admission for chf.  Assessment/Plan:   Principal Problem:   Acute on chronic congestive heart failure (HCC) Active Problems:   Cardiomyopathy (HCC)   Hypertensive urgency   COPD (chronic obstructive pulmonary disease) (HCC)   Lung nodule   Arthritis   #1. Acute on chronic systolic heart failure.  Reportedly patient had echocardiogram done at [facility] and was called with results and instructed to go to the emergency department.  He has no recollection of what those results were.  Has a history of cardiomyopathy.  Chart review indicates he had a left cath in 2017 showed an EF of 30%.  CTA negative for PE today but does reveal multichamber cardiomegaly with reflux of contrast into the hepatic veins. HS trops elevated but flat, BNP 992. Home medications include Coreg.  I am following up with Nehemiah Settle at [facility] [phone], cardiology) to try and obtain records.  This is proving to be quite labor-intensive.  He was provided with IV Lasix has been diuresing. Volume  status -1.2 L -We will order a repeat echo -daily weights -intake and output -continue IV lasix -Continue Coreg -Consider cardiology consult  2.  Hypertensive urgency.  Diastolic blood pressure greater than 100 upon presentation.  This has improved with diuresis. -Continue IV Lasix as noted above -Coreg as noted above -Monitor  closely  #3.  COPD.  He stopped smoking 1 month ago.  Is not on home oxygen.  Oxygen saturation level greater than 90% on room air.  No wheezing on exam.  CTA reveals severe bronchial thickening which leads to bronchial occlusion and peripheral branches no evidence of pneumonia -Continue home Combivent -Monitor oxygen saturation level -Supplemental oxygen as indicated  #4.  Lung nodule.  Incidental finding on CTA in the emergency department.  Report says unchanged 7 mm subpleural left upper lobe pulmonary nodule from prior exam.  Recommend noncontrast chest CT 6 to 12 months.  Family Communication/Anticipated D/C date and plan/Code Status   DVT prophylaxis: Lovenox ordered. Code Status: Full Code.  Family Communication: patient at bedside Disposition Plan: home when ready   Medical Consultants:    cardiology   Anti-Infectives:    None  Subjective:   Sitting up in bed watching TV.  Reports he is breathing "much better".  Denies chest pain and says that he "never had any chest pain".   Objective:    Vitals:   12/22/19 0600 12/22/19 0700 12/22/19 0800 12/22/19 0907  BP: 126/90 106/88 120/89 115/89  Pulse: 95 90 98 96  Resp: 20 (!) 21 20 (!) 27  Temp:      TempSrc:      SpO2: 94% 94% 95% 92%    Intake/Output Summary (Last 24 hours) at 12/22/2019 0913 Last data filed at 12/22/2019 0441 Gross per 24 hour  Intake --  Output 1250 ml  Net -1250 ml   There were no vitals filed for this visit.  Exam:  General: Awake alert well-nourished in no acute distress CV regular rate and rhythm no murmur gallop or rub no lower extremity edema Respiratory: No increased work of breathing breath sounds are slightly distant but I do hear fine crackles bilateral bases.  No wheeze no rhonchi Abdomen: Nondistended soft positive bowel sounds throughout nontender to palpation no guarding or rebounding Musculoskeletal: Joints without swelling/erythema full range of motion Neuro: Alert and  oriented x3 speech clear facial symmetry  Data Reviewed:   I have personally reviewed following labs and imaging studies:  Labs: Labs show the following:   Basic Metabolic Panel: Recent Labs  Lab 12/21/19 1834  NA 140  K 3.7  CL 104  CO2 26  GLUCOSE 104*  BUN 10  CREATININE 1.03  CALCIUM 9.2   GFR CrCl cannot be calculated (Unknown ideal weight.). Liver Function Tests: Recent Labs  Lab 12/22/19 0305  AST 32  ALT 28  ALKPHOS 62  BILITOT 1.2  PROT 6.6  ALBUMIN 3.1*   No results for input(s): LIPASE, AMYLASE in the last 168 hours. No results for input(s): AMMONIA in the last 168 hours. Coagulation profile No results for input(s): INR, PROTIME in the last 168 hours.  CBC: Recent Labs  Lab 12/21/19 1834  WBC 6.1  HGB 16.6  HCT 49.5  MCV 90.0  PLT 194   Cardiac Enzymes: No results for input(s): CKTOTAL, CKMB, CKMBINDEX, TROPONINI in the last 168 hours. BNP (last 3 results) No results for input(s): PROBNP in the last 8760 hours. CBG: No results for input(s): GLUCAP in the last 168 hours. D-Dimer: No results for input(s): DDIMER in the last 72 hours. Hgb A1c: No results for input(s): HGBA1C in the last 72 hours. Lipid Profile: No results for input(s): CHOL, HDL, LDLCALC, TRIG, CHOLHDL, LDLDIRECT in the last 72 hours. Thyroid function studies: No results for input(s): TSH, T4TOTAL, T3FREE, THYROIDAB in the last 72 hours.  Invalid input(s): FREET3 Anemia work up: No results for input(s): VITAMINB12, FOLATE, FERRITIN, TIBC, IRON, RETICCTPCT in the last 72 hours. Sepsis Labs: Recent Labs  Lab 12/21/19 1834  WBC 6.1    Microbiology Recent Results (from the past 240 hour(s))  SARS CORONAVIRUS 2 (TAT 6-24 HRS) Nasopharyngeal Nasopharyngeal Swab     Status: None   Collection Time: 12/22/19  3:30 AM   Specimen: Nasopharyngeal Swab  Result Value Ref Range Status   SARS Coronavirus 2 NEGATIVE NEGATIVE Final    Comment: (NOTE) SARS-CoV-2 target nucleic  acids are NOT DETECTED. The SARS-CoV-2 RNA is generally detectable in upper and lower respiratory specimens during the acute phase of infection. Negative results do not preclude SARS-CoV-2 infection, do not rule out co-infections with other pathogens, and should not be used as the sole basis for treatment or other patient management decisions. Negative results must be combined with clinical observations, patient history, and epidemiological information. The expected result is Negative. Fact Sheet for Patients: HairSlick.no Fact Sheet for Healthcare Providers: quierodirigir.com This test is not yet approved or cleared by the [place] FDA and  has been authorized for detection and/or diagnosis of SARS-CoV-2 by FDA under an Emergency Use Authorization (EUA). This EUA will remain  in effect (meaning this test can be used) for the duration of the COVID-19 declaration under Section 56 4(b)(1) of the Act, 21 U.S.C. section 360bbb-3(b)(1), unless the authorization is terminated or revoked sooner. Performed at [facility], [address] [place], [place] [address]     Procedures and diagnostic studies:  DG Chest  2 View  Result Date: 12/21/2019 CLINICAL DATA:  Shortness of breath EXAM: CHEST - 2 VIEW COMPARISON:  08/19/2019 FINDINGS: Chronic bronchitic changes. Hazy right infrahilar atelectasis or mild infiltrate. Stable cardiomediastinal silhouette with aortic atherosclerosis. No pneumothorax. IMPRESSION: 1. Hazy right infrahilar atelectasis or potential developing infiltrate 2. Chronic bronchitic changes Electronically Signed   By: Jasmine Pang M.D.   On: 12/21/2019 19:11   CT Angio Chest PE W and/or Wo Contrast  Result Date: 12/22/2019 CLINICAL DATA:  Shortness of breath. Pulmonary embolism (PE), suspected EXAM: CT ANGIOGRAPHY CHEST WITH CONTRAST TECHNIQUE: Multidetector CT imaging of the chest was performed using the  standard protocol during bolus administration of intravenous contrast. Multiplanar CT image reconstructions and MIPs were obtained to evaluate the vascular anatomy. CONTRAST:  57mL OMNIPAQUE IOHEXOL 350 MG/ML SOLN COMPARISON:  Radiograph yesterday. Chest CTA 08/19/2019 FINDINGS: Cardiovascular: Excellent opacification of pulmonary arteries. There are no filling defects within the pulmonary arteries to suggest pulmonary embolus. Mild aortic atherosclerosis. No aortic aneurysm. Cannot assess for dissection given phase of contrast tailored to pulmonary artery evaluation. There are coronary artery calcifications. Multi chamber cardiomegaly. Contrast refluxes into the hepatic veins and IVC. Mediastinum/Nodes: Small mediastinal lymph nodes not enlarged by size criteria. Ill-defined density in the hilar regions likely nodal tissue, not pathologically enlarged. No esophageal wall thickening. No suspicious thyroid nodule. Lungs/Pleura: Severe bronchial thickening which leads to bronchial occlusion in the peripheral branches. Subpleural atelectasis in the left lower lobe. Minimal linear scarring in the left upper lobe. 7 mm subpleural nodule in the left upper lobe series 6, image 54 is unchanged from prior. No endobronchial lesion. No evidence of pneumonia. Mild emphysema. No pleural fluid. No pulmonary edema. No pulmonary mass. Upper Abdomen: Contrast refluxing into the hepatic veins and IVC. No other acute findings. Musculoskeletal: Multilevel degenerative change in the spine. There are no acute or suspicious osseous abnormalities. Review of the MIP images confirms the above findings. IMPRESSION: 1. No pulmonary embolus. 2. Severe bronchial thickening which leads to bronchial occlusion in the peripheral branches. No evidence of pneumonia. 3. Multi chamber cardiomegaly with reflux of contrast into the hepatic veins and IVC consistent with elevated right heart pressures. Coronary artery calcifications. 4. Unchanged 7 mm  subpleural left upper lobe pulmonary nodule from prior exam. Non-contrast chest CT at 6-12 months is recommended. If the nodule is stable at time of repeat CT, then future CT at 18-24 months (from today's scan) is considered optional for low-risk patients, but is recommended for high-risk patients. This recommendation follows the consensus statement: Guidelines for Management of Incidental Pulmonary Nodules Detected on CT Images: From the Fleischner Society 2017; Radiology 2017; 284:228-243. Aortic Atherosclerosis (ICD10-I70.0) and Emphysema (ICD10-J43.9). Electronically Signed   By: Narda Rutherford M.D.   On: 12/22/2019 02:32    Medications:   . aspirin EC  81 mg Oral Daily  . carvedilol  6.25 mg Oral BID WC  . furosemide  40 mg Intravenous Q12H  . guaiFENesin  1,200 mg Oral BID  . hydrALAZINE  25 mg Oral Q8H  . predniSONE  4 mg Oral Q breakfast  . rivaroxaban  10 mg Oral Daily  . sodium chloride flush  3 mL Intravenous Once  . sodium chloride flush  3 mL Intravenous Q12H   Continuous Infusions: . sodium chloride       LOS: 0 days   Gwenyth Bender NP [facility]   How to contact the [facility] Attending or Consulting provider 7A - 7P or covering provider during after  hours 7P -7A, for this patient?  1. Check the care team in [facility] and look for a) attending/consulting [facility] provider listed and b) the [facility] team listed 2. Log into [email or web address] and use [place]'s universal password to access. If you do not have the password, please contact the hospital operator. 3. Locate the [facility] provider you are looking for under [facility] and page to a number that you can be directly reached. 4. If you still have difficulty reaching the provider, please page the [facility] (Director on Call) for the Hospitalists listed on amion for assistance.  12/22/2019, 9:13 AM

## 2019-12-22 NOTE — ED Notes (Signed)
Pt transported to CT ?

## 2019-12-22 NOTE — ED Notes (Signed)
Tele   Breakfast ordered  

## 2019-12-22 NOTE — H&P (View-Only) (Signed)
Cardiology Consultation:   Patient ID: Brandon Frye; [id number]; 06/05/1946   Admit date: 12/21/2019 Date of Consult: 12/22/2019  Primary Care Provider: [facility] Primary Cardiologist: No primary care provider on file. [facility] Primary Electrophysiologist:  None   Patient Profile:   Brandon Frye is a 74 y.o. male with a hx of NICM w/ EF 30% 2017 cath, CHF, COPD, HTN, DVT on Xarelto, who is being seen today for the evaluation of CHF at the request of Dr Janee Morn.  History of Present Illness:   Brandon Frye gets his cardiology care at the [facility].  He was having problems with orthopnea and PND.  He was also having some dyspnea on exertion.  He had a syncopal episode several weeks ago.  He was evaluated at the [facility].  They ordered an echocardiogram, and nuclear stress test, and a 2-week ZIO monitor.  Results are below  [facility] once they reviewed the results of the testing and recommended that he come to the hospital.  He is here today.  His symptoms have been essentially unchanged over the last 2 weeks or so.  About 2 or 3 weeks ago, he was in his kitchen and had a coughing spell.  He woke up on the floor.  As soon as he woke up he knew who and where he was.  There was no incontinence.  He did not have any palpitations and had no awareness of a rapid or irregular heartbeat.  He was not wearing the monitor yet.  The 2-week ZIO monitor showed 40 episodes of VT, the longest one 9.1 seconds.  Brandon Frye does not remember having any palpitations, rapid heartbeats, or feeling lightheaded or dizzy.  He has not had any chest pain.  He denies lower extremity edema.  In discussing why he has trouble sleeping, he states he will go to bed on several pillows, but eventually rolled off the pillows in the night and wake up short of breath.  He has not had any lower extremity edema.  He is not very active, but feels he is not having any trouble walking and doing other  things that he needs to do.  He does not exercise or climb stairs.  He feels he can do less than he could do 6 months or year ago, but no acute changes and no recent change.   Past Medical History:  Diagnosis Date  . Arthritis   . Chronic systolic CHF (congestive heart failure) (HCC)   . Non-ischemic cardiomyopathy (HCC) 2017   clean cath, EF 30%    Past Surgical History:  Procedure Laterality Date  . CARDIAC CATHETERIZATION  2017   at [facility] in [place], clean, EF 30%  . KNEE SURGERY       Prior to Admission medications   Medication Sig Start Date End Date Taking? Authorizing Provider  aspirin EC 81 MG tablet Take 81 mg by mouth daily.   Yes [provider]  carvedilol (COREG) 12.5 MG tablet Take 6.25 mg by mouth 2 (two) times daily with a meal.   Yes [provider]  Ipratropium-Albuterol (COMBIVENT RESPIMAT) 20-100 MCG/ACT AERS respimat Inhale 1 puff into the lungs every 6 (six) hours as needed for wheezing or shortness of breath (or coughing). 04/10/17  Yes [name], MD  nitroGLYCERIN (NITROSTAT) 0.4 MG SL tablet Place 0.4 mg under the tongue every 5 (five) minutes as needed for chest pain.   Yes [provider]  predniSONE (DELTASONE) 50 MG tablet Take  1 tablet (50 mg total) by mouth daily. 04/10/17  Yes [name], MD  rivaroxaban (XARELTO) 10 MG TABS tablet Take 10 mg by mouth daily.   Yes [provider]  HYDROcodone-acetaminophen (NORCO) 5-325 MG tablet Take 1 tablet by mouth every 4 (four) hours as needed for moderate pain (or coughing). Patient not taking: Reported on 12/22/2019 04/10/17   [name], MD    Inpatient Medications: Scheduled Meds: . aspirin EC  81 mg Oral Daily  . carvedilol  6.25 mg Oral BID WC  . furosemide  40 mg Intravenous Q12H  . guaiFENesin  1,200 mg Oral BID  . hydrALAZINE  25 mg Oral Q8H  . predniSONE  4 mg Oral Q breakfast  . rivaroxaban  10 mg Oral Daily  . sodium chloride flush  3 mL Intravenous Once    . sodium chloride flush  3 mL Intravenous Q12H   Continuous Infusions: . sodium chloride    . magnesium sulfate bolus IVPB 4 g (12/22/19 1128)   PRN Meds: sodium chloride, acetaminophen, ipratropium-albuterol, ondansetron (ZOFRAN) IV, sodium chloride flush  Allergies:   No Known Allergies  Social History:   Social History   Socioeconomic History  . Marital status: Single    Spouse name: Not on file  . Number of children: Not on file  . Years of education: Not on file  . Highest education level: Not on file  Occupational History  . Not on file  Tobacco Use  . Smoking status: Current Every Day Smoker    Packs/day: 1.00    Years: 25.00    Pack years: 25.00    Types: Cigarettes  . Smokeless tobacco: Never Used  Substance and Sexual Activity  . Alcohol use: No  . Drug use: No  . Sexual activity: Not on file  Other Topics Concern  . Not on file  Social History Narrative  . Not on file   Social Determinants of Health   Financial Resource Strain:   . Difficulty of Paying Living Expenses: Not on file  Food Insecurity:   . Worried About Programme researcher, broadcasting/film/video in the Last Year: Not on file  . Ran Out of Food in the Last Year: Not on file  Transportation Needs:   . Lack of Transportation (Medical): Not on file  . Lack of Transportation (Non-Medical): Not on file  Physical Activity:   . Days of Exercise per Week: Not on file  . Minutes of Exercise per Session: Not on file  Stress:   . Feeling of Stress : Not on file  Social Connections:   . Frequency of Communication with Friends and Family: Not on file  . Frequency of Social Gatherings with Friends and Family: Not on file  . Attends Religious Services: Not on file  . Active Member of Clubs or Organizations: Not on file  . Attends Banker Meetings: Not on file  . Marital Status: Not on file  Intimate Partner Violence:   . Fear of Current or Ex-Partner: Not on file  . Emotionally Abused: Not on file  .  Physically Abused: Not on file  . Sexually Abused: Not on file    Family History:   Family History  Problem Relation Age of Onset  . Stroke Mother        Multiple strokes, died at 56  . Heart attack Father 34   Family Status:  Family Status  Relation Name Status  . Mother  Deceased  . Father  Deceased  ROS:  Please see the history of present illness.  All other ROS reviewed and negative.     Physical Exam/Data:   Vitals:   12/22/19 1000 12/22/19 1113 12/22/19 1125 12/22/19 1200  BP: 115/86 (!) 112/96 (!) 125/96 (!) 115/91  Pulse: 85 88 (!) 104 84  Resp: 19 (!) 22 (!) 21 20  Temp:      TempSrc:      SpO2: 96% 94% 97% 93%    Intake/Output Summary (Last 24 hours) at 12/22/2019 1230 Last data filed at 12/22/2019 0441 Gross per 24 hour  Intake --  Output 1250 ml  Net -1250 ml   There were no vitals filed for this visit. There is no height or weight on file to calculate BMI.  General:  Well nourished, well developed, male in no acute distress HEENT: normal Lymph: no adenopathy Neck: JVD -10 cm Endocrine:  No thryomegaly Vascular: No carotid bruits; 4/4 extremity pulses 2+  Cardiac:  normal S1, S2; RRR; no murmur Lungs: Decreased breath sounds generally, scattered dry rales bilaterally, no wheezing, rhonchi Abd: soft, nontender, no hepatomegaly  Ext: no edema Musculoskeletal:  No deformities, BUE and BLE strength normal and equal Skin: warm and dry  Neuro:  CNs 2-12 intact, no focal abnormalities noted Psych:  Normal affect   EKG:  The EKG was personally reviewed and demonstrates: 2/17 ECG is sinus tach, heart rate 107, biatrial enlargement, T wave changes in V5-6 are similar to the ones in V6 on the ECG from 08/19/2019, possible lead placement Telemetry:  Telemetry was personally reviewed and demonstrates: Sinus rhythm   CV studies:   ECHO: ordered  ECHO: 12/20/2019 Mod dilated LV w/ EF 25-30%, myocardial strain bullseye imaging>> ?cardiac  amyloid Longitudinal strain -6.7% Indeterminate diastolic function <50% IVC insp collapse>>elevated R atrial pressure PASP est 44 mmHg  NUCLEAR STRESS TEST: 12/20/2019 Tiny fixed apical defect, likely attenuation artifact No ischemia EF 25%  ZIO MONITOR: 2 weeks, ending 12/09/2019 40 episodes VT, longest 9.1 sec, SR, SVT Min HR 42, max HR 285   CATH: 10/22/2016 FINDINGS:  No significant CAD. Mild diffuse disease only throughout the coronary tree. Left main normal. LAD mild diffuse disease including its diagonal and septal branches. Left circumflex mild diffuse disease including its obtuse marginal branches. RCA dominant with a small, mild diffuse disease including its terminal branches. Normal LVEDP, no aortic valve gradient, LV gram: Global hypokinesis, estimated EF 30%     Laboratory Data:   Chemistry Recent Labs  Lab 12/21/19 1834 12/22/19 0909  NA 140 139  K 3.7 3.3*  CL 104 99  CO2 26 31  GLUCOSE 104* 122*  BUN 10 11  CREATININE 1.03 1.05  CALCIUM 9.2 8.9  GFRNONAA >60 >60  GFRAA >60 >60  ANIONGAP 10 9    Lab Results  Component Value Date   ALT 28 12/22/2019   AST 32 12/22/2019   ALKPHOS 62 12/22/2019   BILITOT 1.2 12/22/2019   Hematology Recent Labs  Lab 12/21/19 1834 12/22/19 0909  WBC 6.1 4.7  RBC 5.50 5.43  HGB 16.6 16.4  HCT 49.5 49.3  MCV 90.0 90.8  MCH 30.2 30.2  MCHC 33.5 33.3  RDW 13.4 13.5  PLT 194 182   Cardiac Enzymes High Sensitivity Troponin:   Recent Labs  Lab 12/21/19 1834 12/21/19 2047  TROPONINIHS 28* 29*      BNP Recent Labs  Lab 12/22/19 0127  BNP 992.9*     TSH: No results found for: TSH Lipids:  Lab Results  Component Value Date   CHOL 176 08/19/2019   HDL 76 08/19/2019   LDLCALC 86 08/19/2019   TRIG 71 08/19/2019   CHOLHDL 2.3 08/19/2019   HgbA1c:No results found for: HGBA1C Magnesium:  Magnesium  Date Value Ref Range Status  12/22/2019 1.5 (L) 1.7 - 2.4 mg/dL Final    Comment:    Performed  at [facility], [address], [place], [place] [address]     Radiology/Studies:  DG Chest 2 View  Result Date: 12/21/2019 CLINICAL DATA:  Shortness of breath EXAM: CHEST - 2 VIEW COMPARISON:  08/19/2019 FINDINGS: Chronic bronchitic changes. Hazy right infrahilar atelectasis or mild infiltrate. Stable cardiomediastinal silhouette with aortic atherosclerosis. No pneumothorax. IMPRESSION: 1. Hazy right infrahilar atelectasis or potential developing infiltrate 2. Chronic bronchitic changes Electronically Signed   By: Jasmine Pang M.D.   On: 12/21/2019 19:11   CT Angio Chest PE W and/or Wo Contrast  Result Date: 12/22/2019 CLINICAL DATA:  Shortness of breath. Pulmonary embolism (PE), suspected EXAM: CT ANGIOGRAPHY CHEST WITH CONTRAST TECHNIQUE: Multidetector CT imaging of the chest was performed using the standard protocol during bolus administration of intravenous contrast. Multiplanar CT image reconstructions and MIPs were obtained to evaluate the vascular anatomy. CONTRAST:  29mL OMNIPAQUE IOHEXOL 350 MG/ML SOLN COMPARISON:  Radiograph yesterday. Chest CTA 08/19/2019 FINDINGS: Cardiovascular: Excellent opacification of pulmonary arteries. There are no filling defects within the pulmonary arteries to suggest pulmonary embolus. Mild aortic atherosclerosis. No aortic aneurysm. Cannot assess for dissection given phase of contrast tailored to pulmonary artery evaluation. There are coronary artery calcifications. Multi chamber cardiomegaly. Contrast refluxes into the hepatic veins and IVC. Mediastinum/Nodes: Small mediastinal lymph nodes not enlarged by size criteria. Ill-defined density in the hilar regions likely nodal tissue, not pathologically enlarged. No esophageal wall thickening. No suspicious thyroid nodule. Lungs/Pleura: Severe bronchial thickening which leads to bronchial occlusion in the peripheral branches. Subpleural atelectasis in the left lower lobe. Minimal linear scarring in the left  upper lobe. 7 mm subpleural nodule in the left upper lobe series 6, image 54 is unchanged from prior. No endobronchial lesion. No evidence of pneumonia. Mild emphysema. No pleural fluid. No pulmonary edema. No pulmonary mass. Upper Abdomen: Contrast refluxing into the hepatic veins and IVC. No other acute findings. Musculoskeletal: Multilevel degenerative change in the spine. There are no acute or suspicious osseous abnormalities. Review of the MIP images confirms the above findings. IMPRESSION: 1. No pulmonary embolus. 2. Severe bronchial thickening which leads to bronchial occlusion in the peripheral branches. No evidence of pneumonia. 3. Multi chamber cardiomegaly with reflux of contrast into the hepatic veins and IVC consistent with elevated right heart pressures. Coronary artery calcifications. 4. Unchanged 7 mm subpleural left upper lobe pulmonary nodule from prior exam. Non-contrast chest CT at 6-12 months is recommended. If the nodule is stable at time of repeat CT, then future CT at 18-24 months (from today's scan) is considered optional for low-risk patients, but is recommended for high-risk patients. This recommendation follows the consensus statement: Guidelines for Management of Incidental Pulmonary Nodules Detected on CT Images: From the Fleischner Society 2017; Radiology 2017; 284:228-243. Aortic Atherosclerosis (ICD10-I70.0) and Emphysema (ICD10-J43.9). Electronically Signed   By: Narda Rutherford M.D.   On: 12/22/2019 02:32    Assessment and Plan:   1. Acute on chronic systolic CHF -See echo report above, possible amyloid based on bull's-eye pattern -BNP is elevated, elevated right heart pressures on CT today but no edema -With elevated right heart pressures,  he has been started on Lasix 40 mg IV twice daily -Potassium has been supplemented, continue this.  He has had 40 mEq, give 40 mEq twice daily -Renal function, daily weights, and intake/output. -he has already put out 1250 cc from  his first Lasix dose -Discuss further evaluation and targeted treatment for possible amyloid with MD  2.  Nonsustained VT -Seen on recent monitor, asymptomatic. -Magnesium level today 1.5, supplemented by IM -We will give additional potassium supplementation to get potassium level to 4.0 -Keep on telemetry. -Discuss EP referral for possible ICD with MD  Otherwise, per IM Principal Problem:   Acute on chronic congestive heart failure (HCC) Active Problems:   Cardiomyopathy (HCC)   COPD (chronic obstructive pulmonary disease) (HCC)   Hypertensive urgency   Lung nodule   Arthritis   For questions or updates, please contact [facility] Please consult [email or web address] for contact info under Cardiology/STEMI.   Signed, Theodore Demark, PA-C  12/22/2019 12:30 PM  Patient seen with PA, agree with the above note.   Patient has history of prior DVT, COPD, and HTN.  He also had a nonischemic cardiomyopathy diagnosed in 2017 in [place], [place] was 30% with nonobstructive coronary disease on cath.    Patient now lives in [place] and gets care at the [place].  About 3 weeks ago, he passed out in his house and woke up on the floor.  He was seen at the [facility] and had an echo and a 2 week Zio patch. The echo showed EF 25-30% with bullseye strain pattern and dilated IVC.  The Zio patch showed around 40 episodes of NSVT, the longest was 9 seconds.  When the results returned, [facility] told him to go to the ER.   He reports 2-3 weeks of orthopnea, has to sleep on 3-4 pillows.  He is short of breath walking around his house, especially in the morning.  This has been going on for 2-3 weeks as well. No chest pain.  No further syncopal episodes.   General: NAD Neck: JVP 12-14 cm, no thyromegaly or thyroid nodule.  Lungs: Clear to auscultation bilaterally with normal respiratory effort. CV: Nondisplaced PMI.  Heart regular S1/S2, no S3/S4, no murmur.  No peripheral edema.  No carotid bruit.  Normal pedal pulses.   Abdomen: Soft, nontender, no hepatosplenomegaly, no distention.  Skin: Intact without lesions or rashes.  Neurologic: Alert and oriented x 3.  Psych: Normal affect. Extremities: No clubbing or cyanosis.  HEENT: Normal.   1. Acute on chronic systolic CHF: Cath in 2017 showed nonobstructive CAD in setting of EF 30%.  Echo in 2/21 at the [facility] showed EF 25-30% with bullseye pattern on strain imaging concerning for amyloidosis.  No family history of cardiomyopathy but father had MI at age 78.  Increased exertional symptoms as well as orthopnea for 2-3 weeks, no inciting event. HS-TnI in 20s range with no trend.  On exam, he is volume overloaded. BP on the higher side.  - Lasix 40 mg IV bid for now (good response so far, he was not on this at home.  - Continue home Coreg.  - Add spironolactone 12.5 daily.  - Add Entresto 24/26 bid.  - Multiple NSVT runs and presumed VT as cause of syncope, needs ischemic workup.  I will arrange for right/left heart cath to assess for coronary disease and also to look at filling pressures and cardiac output.  - If cath shows no significant CAD, he will need a cardiac MRI to  look for infiltrative disease like amyloidosis (bullseye pattern reported on strain imaging at [place] is concerning).  Will send off myeloma panel and urine immunofixation.  2. Syncope: Multiple NSVT runs on 2 wk Zio patch, up to 9 seconds.  I suspect that the syncopal episode was arrhythmic.   - Start amiodarone 400 mg bid.  - If no significant CAD on cath, will need to talk with EP re: ICD.  - Replace electrolytes.   Loralie Champagne  12/22/2019 2:34 PM

## 2019-12-22 NOTE — ED Notes (Signed)
Assumed care of pt. Pt alert, speaking in full sentences. Resting on cart in NAD. Equal rise and fall of chest noted. Seen by EDP at bedside. All meds given per [facility]. Name/DOB verified with pt. Labs and covid swab collected, labeled with 2 pt identifiers, and sent to lab

## 2019-12-22 NOTE — ED Provider Notes (Signed)
[facility] EMERGENCY DEPARTMENT Provider Note  CSN: [id number] Arrival date & time: 12/21/19 1818  Chief Complaint(s) Shortness of Breath and Chest Pain  HPI Brandon Frye is a 74 y.o. male who presents to the emergency department with 2 weeks of gradually worsening dyspnea on exertion and orthopnea.  Patient reports that the symptoms came on suddenly and got worse since.  At that time he had a syncopal episode.  [facility] that [facility] reportedly [facility] year.  Patient was sent here for further work-up and management of possible heart failure.  Patient denies any recent fevers or infections.  No cough or congestion. Patient denies any chest pain.  No abdominal pain.  No nausea or vomiting.  No other physical complaints. Patient reports being a 30-pack-year smoker and quit last month.  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  Patient has not missed any dosing.  No known history of COPD.    HPI  Past Medical History Past Medical History:  Diagnosis Date  . Arthritis   . Heart attack (HCC)    There are no problems to display for this patient.  Home Medication(s) Prior to Admission medications   Medication Sig Start Date End Date Taking? Authorizing Provider  aspirin EC 81 MG tablet Take 81 mg by mouth daily.   Yes [provider]  carvedilol (COREG) 12.5 MG tablet Take 6.25 mg by mouth 2 (two) times daily with a meal.   Yes [provider]  Ipratropium-Albuterol (COMBIVENT RESPIMAT) 20-100 MCG/ACT AERS respimat Inhale 1 puff into the lungs every 6 (six) hours as needed for wheezing or shortness of breath (or coughing). 04/10/17  Yes [name], MD  nitroGLYCERIN (NITROSTAT) 0.4 MG SL tablet Place 0.4 mg under the tongue every 5 (five) minutes as needed for chest pain.   Yes [provider]  predniSONE (DELTASONE) 50 MG tablet Take 1 tablet (50 mg total) by mouth daily. 04/10/17  Yes [name], MD   rivaroxaban (XARELTO) 10 MG TABS tablet Take 10 mg by mouth daily.   Yes [provider]  HYDROcodone-acetaminophen (NORCO) 5-325 MG tablet Take 1 tablet by mouth every 4 (four) hours as needed for moderate pain (or coughing). Patient not taking: Reported on 12/22/2019 04/10/17   [name], MD                                                                                                                                    Past Surgical History History reviewed. No pertinent surgical history. Family History History reviewed. No pertinent family history.  Social History Social History   Tobacco Use  . Smoking status: Current Every Day Smoker    Packs/day: 1.00    Years: 25.00    Pack years: 25.00    Types: Cigarettes  . Smokeless tobacco: Never Used  Substance Use Topics  . Alcohol use:  No  . Drug use: No   Allergies Patient has no known allergies.  Review of Systems Review of Systems All other systems are reviewed and are negative for acute change except as noted in the HPI  Physical Exam Vital Signs  I have reviewed the triage vital signs BP (!) 139/102   Pulse 91   Temp 98.6 F (37 C) (Oral)   Resp 20   SpO2 92%   Physical Exam Vitals reviewed.  Constitutional:      General: He is not in acute distress.    Appearance: He is well-developed. He is not diaphoretic.  HENT:     Head: Normocephalic and atraumatic.     Nose: Nose normal.  Eyes:     General: No scleral icterus.       Right eye: No discharge.        Left eye: No discharge.     Conjunctiva/sclera: Conjunctivae normal.     Pupils: Pupils are equal, round, and reactive to light.  Neck:     Vascular: Hepatojugular reflux and JVD present.  Cardiovascular:     Rate and Rhythm: Regular rhythm. Tachycardia present.     Heart sounds: No murmur. No friction rub. No gallop.   Pulmonary:     Effort: Pulmonary effort is normal. Tachypnea present. No respiratory distress.     Breath sounds: No  stridor. Wheezing (faint exp) present. No rales.  Abdominal:     General: There is no distension.     Palpations: Abdomen is soft.     Tenderness: There is no abdominal tenderness.  Musculoskeletal:        General: No tenderness.     Cervical back: Normal range of motion and neck supple.  Skin:    General: Skin is warm and dry.     Findings: No erythema or rash.  Neurological:     Mental Status: He is alert and oriented to person, place, and time.     ED Results and Treatments Labs (all labs ordered are listed, but only abnormal results are displayed) Labs Reviewed  BASIC METABOLIC PANEL - Abnormal; Notable for the following components:      Result Value   Glucose, Bld 104 (*)    All other components within normal limits  BRAIN NATRIURETIC PEPTIDE - Abnormal; Notable for the following components:   B Natriuretic Peptide 992.9 (*)    All other components within normal limits  TROPONIN I (HIGH SENSITIVITY) - Abnormal; Notable for the following components:   Troponin I (High Sensitivity) 28 (*)    All other components within normal limits  TROPONIN I (HIGH SENSITIVITY) - Abnormal; Notable for the following components:   Troponin I (High Sensitivity) 29 (*)    All other components within normal limits  SARS CORONAVIRUS 2 (TAT 6-24 HRS)  CBC  HEPATIC FUNCTION PANEL  EKG  EKG Interpretation  Date/Time:  Wednesday December 21 2019 18:32:27 EST Ventricular Rate:  107 PR Interval:  152 QRS Duration: 106 QT Interval:  360 QTC Calculation: 480 R Axis:   -28 Text Interpretation: Sinus tachycardia Biatrial enlargement Anterior infarct , age undetermined T wave abnormality, consider lateral ischemia Abnormal ECG Confirmed by Drema Pry [phone]) on 12/22/2019 2:18:14 AM      Radiology DG Chest 2 View  Result Date: 12/21/2019 CLINICAL DATA:  Shortness of  breath EXAM: CHEST - 2 VIEW COMPARISON:  08/19/2019 FINDINGS: Chronic bronchitic changes. Hazy right infrahilar atelectasis or mild infiltrate. Stable cardiomediastinal silhouette with aortic atherosclerosis. No pneumothorax. IMPRESSION: 1. Hazy right infrahilar atelectasis or potential developing infiltrate 2. Chronic bronchitic changes Electronically Signed   By: Jasmine Pang M.D.   On: 12/21/2019 19:11   CT Angio Chest PE W and/or Wo Contrast  Result Date: 12/22/2019 CLINICAL DATA:  Shortness of breath. Pulmonary embolism (PE), suspected EXAM: CT ANGIOGRAPHY CHEST WITH CONTRAST TECHNIQUE: Multidetector CT imaging of the chest was performed using the standard protocol during bolus administration of intravenous contrast. Multiplanar CT image reconstructions and MIPs were obtained to evaluate the vascular anatomy. CONTRAST:  30mL OMNIPAQUE IOHEXOL 350 MG/ML SOLN COMPARISON:  Radiograph yesterday. Chest CTA 08/19/2019 FINDINGS: Cardiovascular: Excellent opacification of pulmonary arteries. There are no filling defects within the pulmonary arteries to suggest pulmonary embolus. Mild aortic atherosclerosis. No aortic aneurysm. Cannot assess for dissection given phase of contrast tailored to pulmonary artery evaluation. There are coronary artery calcifications. Multi chamber cardiomegaly. Contrast refluxes into the hepatic veins and IVC. Mediastinum/Nodes: Small mediastinal lymph nodes not enlarged by size criteria. Ill-defined density in the hilar regions likely nodal tissue, not pathologically enlarged. No esophageal wall thickening. No suspicious thyroid nodule. Lungs/Pleura: Severe bronchial thickening which leads to bronchial occlusion in the peripheral branches. Subpleural atelectasis in the left lower lobe. Minimal linear scarring in the left upper lobe. 7 mm subpleural nodule in the left upper lobe series 6, image 54 is unchanged from prior. No endobronchial lesion. No evidence of pneumonia. Mild emphysema.  No pleural fluid. No pulmonary edema. No pulmonary mass. Upper Abdomen: Contrast refluxing into the hepatic veins and IVC. No other acute findings. Musculoskeletal: Multilevel degenerative change in the spine. There are no acute or suspicious osseous abnormalities. Review of the MIP images confirms the above findings. IMPRESSION: 1. No pulmonary embolus. 2. Severe bronchial thickening which leads to bronchial occlusion in the peripheral branches. No evidence of pneumonia. 3. Multi chamber cardiomegaly with reflux of contrast into the hepatic veins and IVC consistent with elevated right heart pressures. Coronary artery calcifications. 4. Unchanged 7 mm subpleural left upper lobe pulmonary nodule from prior exam. Non-contrast chest CT at 6-12 months is recommended. If the nodule is stable at time of repeat CT, then future CT at 18-24 months (from today's scan) is considered optional for low-risk patients, but is recommended for high-risk patients. This recommendation follows the consensus statement: Guidelines for Management of Incidental Pulmonary Nodules Detected on CT Images: From the Fleischner Society 2017; Radiology 2017; 284:228-243. Aortic Atherosclerosis (ICD10-I70.0) and Emphysema (ICD10-J43.9). Electronically Signed   By: Narda Rutherford M.D.   On: 12/22/2019 02:32    Pertinent labs & imaging results that were available during my care of the patient were reviewed by me and considered in my medical decision making (see chart for details).  Medications Ordered in ED Medications  sodium chloride flush (NS) 0.9 % injection 3 mL (has no administration in time range)  albuterol (VENTOLIN HFA) 108 (90 Base) MCG/ACT inhaler 4 puff (has no administration in time range)  furosemide (LASIX) injection 40 mg (has no administration in time range)  iohexol (OMNIPAQUE) 350 MG/ML injection 75 mL (75 mLs Intravenous Contrast Given 12/22/19 0224)                                                                                                                                     Procedures .Critical Care Performed by: Nira Conn, MD Authorized by: Nira Conn, MD     CRITICAL CARE Performed by: Amadeo Garnet Avonne Berkery Total critical care time: 35 minutes Critical care time was exclusive of separately billable procedures and treating other patients. Critical care was necessary to treat or prevent imminent or life-threatening deterioration. Critical care was time spent personally by me on the following activities: development of treatment plan with patient and/or surrogate as well as nursing, discussions with consultants, evaluation of patient's response to treatment, examination of patient, obtaining history from patient or surrogate, ordering and performing treatments and interventions, ordering and review of laboratory studies, ordering and review of radiographic studies, pulse oximetry and re-evaluation of patient's condition.  (including critical care time)  Medical Decision Making / ED Course I have reviewed the nursing notes for this encounter and the patient's prior records (if available in EHR or on provided paperwork).   Filimon Miranda was evaluated in Emergency Department on 12/22/2019 for the symptoms described in the history of present illness. He was evaluated in the context of the global COVID-19 pandemic, which necessitated consideration that the patient might be at risk for infection with the SARS-CoV-2 virus that causes COVID-19. Institutional protocols and algorithms that pertain to the evaluation of patients at risk for COVID-19 are in a state of rapid change based on information released by regulatory bodies including the CDC and federal and state organizations. These policies and algorithms were followed during the patient's care in the ED.  Patient presents with several weeks of gradually worsening shortness of breath and dyspnea on exertion. Sent here for  concern for possible heart failure. EKG with new T wave changes in the high lateral leads.  No other acute ischemic changes.  Initial troponin mildly elevated.  Second troponin stable. CBC without anemia.  No significant electrolyte derangements or renal insufficiency. BNP elevated to almost 1000.  Chest x-ray with possible infiltrates. Given the sudden onset in association with syncopal episode with history of DVTs, CTA was obtained to rule out PE and negative.  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  LFTs added.  Given long-term history of smoking, albuterol inhaler was given.  Given evidence concerning for heart failure, patient was given IV Lasix.  He will require admission for further work-up and management.      Final Clinical Impression(s) / ED Diagnoses Final diagnoses:  New onset of congestive heart failure (HCC)  This chart was dictated using voice recognition software.  Despite best efforts to proofread,  errors can occur which can change the documentation meaning.   Nira Conn, MD 12/22/19 [phone]

## 2019-12-23 ENCOUNTER — Encounter (HOSPITAL_COMMUNITY)
Admission: EM | Disposition: A | Discharge status: Home / Self Care | Payer: Self-pay | Source: Ambulatory Visit | Attending: Internal Medicine

## 2019-12-23 ENCOUNTER — Inpatient Hospital Stay (HOSPITAL_COMMUNITY): Payer: No Typology Code available for payment source

## 2019-12-23 DIAGNOSIS — I472 Ventricular tachycardia: Secondary | ICD-10-CM

## 2019-12-23 DIAGNOSIS — I509 Heart failure, unspecified: Secondary | ICD-10-CM

## 2019-12-23 DIAGNOSIS — I5043 Acute on chronic combined systolic (congestive) and diastolic (congestive) heart failure: Secondary | ICD-10-CM

## 2019-12-23 DIAGNOSIS — R55 Syncope and collapse: Secondary | ICD-10-CM

## 2019-12-23 HISTORY — PX: RIGHT/LEFT HEART CATH AND CORONARY ANGIOGRAPHY: CATH118266

## 2019-12-23 LAB — POCT I-STAT EG7
Acid-Base Excess: 5 mmol/L — ABNORMAL HIGH (ref 0.0–2.0)
Bicarbonate: 31.1 mmol/L — ABNORMAL HIGH (ref 20.0–28.0)
Calcium, Ion: 1.18 mmol/L (ref 1.15–1.40)
HCT: 54 % — ABNORMAL HIGH (ref 39.0–52.0)
Hemoglobin: 18.4 g/dL — ABNORMAL HIGH (ref 13.0–17.0)
O2 Saturation: 67 %
Potassium: 4.5 mmol/L (ref 3.5–5.1)
Sodium: 137 mmol/L (ref 135–145)
TCO2: 33 mmol/L — ABNORMAL HIGH (ref 22–32)
pCO2, Ven: 51 mmHg (ref 44.0–60.0)
pH, Ven: 7.394 (ref 7.250–7.430)
pO2, Ven: 36 mmHg (ref 32.0–45.0)

## 2019-12-23 LAB — BASIC METABOLIC PANEL
Anion gap: 14 (ref 5–15)
BUN: 13 mg/dL (ref 8–23)
CO2: 25 mmol/L (ref 22–32)
Calcium: 9.2 mg/dL (ref 8.9–10.3)
Chloride: 99 mmol/L (ref 98–111)
Creatinine, Ser: 1.1 mg/dL (ref 0.61–1.24)
GFR calc Af Amer: >60 mL/min (ref >60)
GFR calc non Af Amer: >60 mL/min (ref >60)
Glucose, Bld: 120 mg/dL — ABNORMAL HIGH (ref 70–99)
Potassium: 5.2 mmol/L — ABNORMAL HIGH (ref 3.5–5.1)
Sodium: 138 mmol/L (ref 135–145)

## 2019-12-23 LAB — CBC
HCT: 51.7 % (ref 39.0–52.0)
Hemoglobin: 17 g/dL (ref 13.0–17.0)
MCH: 29.8 pg (ref 26.0–34.0)
MCHC: 32.9 g/dL (ref 30.0–36.0)
MCV: 90.7 fL (ref 80.0–100.0)
Platelets: 172 10*3/uL (ref 150–400)
RBC: 5.7 MIL/uL (ref 4.22–5.81)
RDW: 13.5 % (ref 11.5–15.5)
WBC: 4.7 10*3/uL (ref 4.0–10.5)
nRBC: 0 % (ref 0.0–0.2)

## 2019-12-23 LAB — MAGNESIUM: Magnesium: 1.8 mg/dL (ref 1.7–2.4)

## 2019-12-23 SURGERY — RIGHT/LEFT HEART CATH AND CORONARY ANGIOGRAPHY
Anesthesia: LOCAL

## 2019-12-23 MED ORDER — FENTANYL CITRATE (PF) 100 MCG/2ML IJ SOLN
INTRAMUSCULAR | Status: AC
  Filled 2019-12-23: qty 2

## 2019-12-23 MED ORDER — SODIUM CHLORIDE 0.9% FLUSH
3.0000 mL | Freq: Two times a day (BID) | INTRAVENOUS | Status: DC
  Administered 2019-12-25: 3 mL via INTRAVENOUS

## 2019-12-23 MED ORDER — LIDOCAINE HCL (PF) 1 % IJ SOLN
INTRAMUSCULAR | Status: AC
  Filled 2019-12-23: qty 30

## 2019-12-23 MED ORDER — HEPARIN SODIUM (PORCINE) 1000 UNIT/ML IJ SOLN
INTRAMUSCULAR | Status: DC | PRN
  Administered 2019-12-23: 4000 [IU] via INTRAVENOUS

## 2019-12-23 MED ORDER — MAGNESIUM SULFATE IN D5W 1-5 GM/100ML-% IV SOLN
1.0000 g | Freq: Once | INTRAVENOUS | Status: AC
  Administered 2019-12-23: 1 g via INTRAVENOUS
  Filled 2019-12-23: qty 100

## 2019-12-23 MED ORDER — LIDOCAINE HCL (PF) 1 % IJ SOLN
INTRAMUSCULAR | Status: DC | PRN
  Administered 2019-12-23 (×2): 2 mL via INTRADERMAL

## 2019-12-23 MED ORDER — HEPARIN (PORCINE) IN NACL 1000-0.9 UT/500ML-% IV SOLN
INTRAVENOUS | Status: DC | PRN
  Administered 2019-12-23 (×2): 500 mL

## 2019-12-23 MED ORDER — MIDAZOLAM HCL 2 MG/2ML IJ SOLN
INTRAMUSCULAR | Status: DC | PRN
  Administered 2019-12-23: 1 mg via INTRAVENOUS

## 2019-12-23 MED ORDER — HYDRALAZINE HCL 20 MG/ML IJ SOLN: 10.0000 mg | INTRAMUSCULAR | Status: AC | PRN

## 2019-12-23 MED ORDER — ENOXAPARIN SODIUM 40 MG/0.4ML ~~LOC~~ SOLN
40.0000 mg | SUBCUTANEOUS | Status: DC
  Administered 2019-12-24 – 2019-12-27 (×3): 40 mg via SUBCUTANEOUS
  Filled 2019-12-23 (×4): qty 0.4

## 2019-12-23 MED ORDER — IOHEXOL 350 MG/ML SOLN
INTRAVENOUS | Status: DC | PRN
  Administered 2019-12-23: 75 mL via INTRA_ARTERIAL

## 2019-12-23 MED ORDER — ONDANSETRON HCL 4 MG/2ML IJ SOLN: 4.0000 mg | Freq: Four times a day (QID) | INTRAMUSCULAR | Status: DC | PRN

## 2019-12-23 MED ORDER — FENTANYL CITRATE (PF) 100 MCG/2ML IJ SOLN
INTRAMUSCULAR | Status: DC | PRN
  Administered 2019-12-23: 25 ug via INTRAVENOUS

## 2019-12-23 MED ORDER — SODIUM CHLORIDE 0.9% FLUSH: 3.0000 mL | INTRAVENOUS | Status: DC | PRN

## 2019-12-23 MED ORDER — VERAPAMIL HCL 2.5 MG/ML IV SOLN
INTRAVENOUS | Status: DC | PRN
  Administered 2019-12-23: 10 mL via INTRA_ARTERIAL

## 2019-12-23 MED ORDER — MIDAZOLAM HCL 2 MG/2ML IJ SOLN
INTRAMUSCULAR | Status: AC
  Filled 2019-12-23: qty 2

## 2019-12-23 MED ORDER — LABETALOL HCL 5 MG/ML IV SOLN: 10.0000 mg | INTRAVENOUS | Status: AC | PRN

## 2019-12-23 MED ORDER — VERAPAMIL HCL 2.5 MG/ML IV SOLN
INTRAVENOUS | Status: AC
  Filled 2019-12-23: qty 2

## 2019-12-23 MED ORDER — GADOBUTROL 1 MMOL/ML IV SOLN
10.0000 mL | Freq: Once | INTRAVENOUS | Status: AC | PRN
  Administered 2019-12-23: 10 mL via INTRAVENOUS

## 2019-12-23 MED ORDER — ACETAMINOPHEN 325 MG PO TABS: 650.0000 mg | ORAL_TABLET | ORAL | Status: DC | PRN

## 2019-12-23 MED ORDER — HEPARIN (PORCINE) IN NACL 1000-0.9 UT/500ML-% IV SOLN
INTRAVENOUS | Status: AC
  Filled 2019-12-23: qty 1000

## 2019-12-23 MED ORDER — DIGOXIN 125 MCG PO TABS
0.1250 mg | ORAL_TABLET | Freq: Every day | ORAL | Status: DC
  Administered 2019-12-23 – 2019-12-27 (×5): 0.125 mg via ORAL
  Filled 2019-12-23 (×5): qty 1

## 2019-12-23 MED ORDER — HEPARIN SODIUM (PORCINE) 1000 UNIT/ML IJ SOLN
INTRAMUSCULAR | Status: AC
  Filled 2019-12-23: qty 1

## 2019-12-23 MED ORDER — SODIUM CHLORIDE 0.9 % IV SOLN
250.0000 mL | INTRAVENOUS | Status: DC | PRN
  Administered 2019-12-23: 250 mL via INTRAVENOUS

## 2019-12-23 SURGICAL SUPPLY — 12 items
CATH 5FR JL3.5 JR4 ANG PIG MP (CATHETERS) ×1 IMPLANT
CATH BALLN WEDGE 5F 110CM (CATHETERS) ×1 IMPLANT
CATH INFINITI 5 FR 3DRC (CATHETERS) ×1 IMPLANT
DEVICE RAD COMP TR BAND LRG (VASCULAR PRODUCTS) ×1 IMPLANT
GLIDESHEATH SLEND SS 6F .021 (SHEATH) ×1 IMPLANT
GUIDEWIRE INQWIRE 1.5J.035X260 (WIRE) IMPLANT
INQWIRE 1.5J .035X260CM (WIRE) ×2
KIT HEART LEFT (KITS) ×2 IMPLANT
PACK CARDIAC CATHETERIZATION (CUSTOM PROCEDURE TRAY) ×2 IMPLANT
SHEATH GLIDE SLENDER 4/5FR (SHEATH) ×1 IMPLANT
SHEATH PROBE COVER 6X72 (BAG) ×1 IMPLANT
TRANSDUCER W/STOPCOCK (MISCELLANEOUS) ×2 IMPLANT

## 2019-12-23 NOTE — Progress Notes (Signed)
Order placed. I have notified [employer]  Robbie Lis, PA-C 12/23/2019

## 2019-12-23 NOTE — Interval H&P Note (Signed)
History and Physical Interval Note:  12/23/2019 9:08 AM  Brandon Frye  has presented today for surgery, with the diagnosis of VT.  The various methods of treatment have been discussed with the patient and family. After consideration of risks, benefits and other options for treatment, the patient has consented to  Procedure(s): RIGHT/LEFT HEART CATH AND CORONARY ANGIOGRAPHY (N/A) as a surgical intervention.  The patient's history has been reviewed, patient examined, no change in status, stable for surgery.  I have reviewed the patient's chart and labs.  Questions were answered to the patient's satisfaction.     [name] [employer]

## 2019-12-23 NOTE — Progress Notes (Signed)
Progress Note    Brandon Frye  ACZ:[id number] DOB: 02/28/1946  DOA: 12/21/2019 PCP: [facility]    Brief Narrative:    Medical records reviewed and are as summarized below:  Brandon Frye is a very pleasant 74 y.o. male veteran who served in [place] presented 2/18 after being instructed by cardiology at [facility] to go to ED as result of echo results that are urgent. Hx cardiomyopathy. He had been experiencing worsening sob and orthopnea. He denies CP but endorses worsening DOE and cough with clear sputum production.  Admitted for further work-up for systolic heart failure.  Cardiology on board.  Assessment/Plan:   Principal Problem:   Acute on chronic congestive heart failure (HCC) Active Problems:   Cardiomyopathy ([place])   COPD (chronic obstructive pulmonary disease) (HCC)   Hypertensive urgency   Lung nodule   Arthritis   Heart failure (HCC)   Syncope   NSVT (nonsustained ventricular tachycardia) ([place])  Acute on chronic decompensated systolic heart failure  Echo in 2/21 at the [facility] showed EF 25 to 30% with bull's-eye pattern on strain imaging Coronary angiogram and right heart cath on 2/19 showed non obstructive CAD Cardiac MRI ordered to further delineate etiology Heart failure, EP on board Diuretics on hold per heart failure recommendations.  Resume p.o. Lasix tomorrow Continue Coreg, spironolactone, Entresto Started on po amiodarone with concern for VT.  Also on digoxin.  Syncope thought to be in setting of coughing spell Tentative plan for GDMT and reassess with repeat echo in 3 months LifeVest ordered  Hypertensive urgency Improved with diuresis  COPD He stopped smoking 1 month ago.  Is not on home oxygen.  Oxygen saturation level greater than 90% on room air.  No wheezing on exam.  CTA reveals severe bronchial thickening which leads to bronchial occlusion and peripheral branches no evidence of pneumonia -Continue home  Combivent -Monitor oxygen saturation level -Supplemental oxygen as indicated  Lung nodule Incidental finding on CTA in the emergency department.  Report says unchanged 7 mm subpleural left upper lobe pulmonary nodule from prior exam.  Recommend noncontrast chest CT 6 to 12 months.  Family Communication/Anticipated D/C date and plan/Code Status   DVT prophylaxis: Lovenox, prophylactic dose Code Status: Full Code.  Family Communication: Updated daughter at bedside Disposition Plan: Plan for discharge home once cardiac issues sorted out   Medical Consultants:    cardiology   Anti-Infectives:    None  Subjective:   Feels much better.  No new complaints.   Objective:    Vitals:   12/23/19 1452 12/23/19 1500 12/23/19 1620 12/23/19 1708  BP: 94/71 90/70 90/69    Pulse: 82  86 92  Resp:   20   Temp:   97.8 F (36.6 C)   TempSrc:   Oral   SpO2: 93%  98%   Weight:        Intake/Output Summary (Last 24 hours) at 12/23/2019 1825 Last data filed at 12/23/2019 1713 Gross per 24 hour  Intake 894.35 ml  Output 1425 ml  Net -530.65 ml   Filed Weights   12/23/19 0358  Weight: 79.1 kg    Exam: General: Awake alert well-nourished in no acute distress CV regular rate and rhythm, no lower extremity edema Respiratory: No increased work of breathing breath sounds.  No wheeze no rhonchi or crackles. Abdomen: Nondistended soft positive bowel sounds throughout nontender to palpation no guarding or rebounding Musculoskeletal: Joints without swelling/erythema full range of motion Neuro: Alert and oriented x3  speech clear facial symmetry  Data Reviewed:   I have personally reviewed following labs and imaging studies:  Labs: Labs show the following:   Basic Metabolic Panel: Recent Labs  Lab 12/21/19 1834 12/21/19 1834 12/22/19 0909 12/22/19 0909 12/23/19 0501 12/23/19 0920  NA 140  --  139  --  138 137  K 3.7   < > 3.3*   < > 5.2* 4.5  CL 104  --  99  --  99  --    CO2 26  --  31  --  25  --   GLUCOSE 104*  --  122*  --  120*  --   BUN 10  --  11  --  13  --   CREATININE 1.03  --  1.05  --  1.10  --   CALCIUM 9.2  --  8.9  --  9.2  --   MG  --   --  1.5*  --  1.8  --    < > = values in this interval not displayed.   GFR Estimated Creatinine Clearance: 68.9 mL/min (by C-G formula based on SCr of 1.1 mg/dL). Liver Function Tests: Recent Labs  Lab 12/22/19 0305  AST 32  ALT 28  ALKPHOS 62  BILITOT 1.2  PROT 6.6  ALBUMIN 3.1*   No results for input(s): LIPASE, AMYLASE in the last 168 hours. No results for input(s): AMMONIA in the last 168 hours. Coagulation profile No results for input(s): INR, PROTIME in the last 168 hours.  CBC: Recent Labs  Lab 12/21/19 1834 12/22/19 0909 12/23/19 0501 12/23/19 0920  WBC 6.1 4.7 4.7  --   NEUTROABS  --  2.6  --   --   HGB 16.6 16.4 17.0 18.4*  HCT 49.5 49.3 51.7 54.0*  MCV 90.0 90.8 90.7  --   PLT 194 182 172  --    Cardiac Enzymes: No results for input(s): CKTOTAL, CKMB, CKMBINDEX, TROPONINI in the last 168 hours. BNP (last 3 results) No results for input(s): PROBNP in the last 8760 hours. CBG: No results for input(s): GLUCAP in the last 168 hours. D-Dimer: No results for input(s): DDIMER in the last 72 hours. Hgb A1c: No results for input(s): HGBA1C in the last 72 hours. Lipid Profile: No results for input(s): CHOL, HDL, LDLCALC, TRIG, CHOLHDL, LDLDIRECT in the last 72 hours. Thyroid function studies: No results for input(s): TSH, T4TOTAL, T3FREE, THYROIDAB in the last 72 hours.  Invalid input(s): FREET3 Anemia work up: No results for input(s): VITAMINB12, FOLATE, FERRITIN, TIBC, IRON, RETICCTPCT in the last 72 hours. Sepsis Labs: Recent Labs  Lab 12/21/19 1834 12/22/19 0909 12/23/19 0501  WBC 6.1 4.7 4.7    Microbiology Recent Results (from the past 240 hour(s))  SARS CORONAVIRUS 2 (TAT 6-24 HRS) Nasopharyngeal Nasopharyngeal Swab     Status: None   Collection Time:  12/22/19  3:30 AM   Specimen: Nasopharyngeal Swab  Result Value Ref Range Status   SARS Coronavirus 2 NEGATIVE NEGATIVE Final    Comment: (NOTE) SARS-CoV-2 target nucleic acids are NOT DETECTED. The SARS-CoV-2 RNA is generally detectable in upper and lower respiratory specimens during the acute phase of infection. Negative results do not preclude SARS-CoV-2 infection, do not rule out co-infections with other pathogens, and should not be used as the sole basis for treatment or other patient management decisions. Negative results must be combined with clinical observations, patient history, and epidemiological information. The expected result is Negative. Fact Sheet for Patients:  HairSlick.no Fact Sheet for Healthcare Providers: quierodirigir.com This test is not yet approved or cleared by the [place] FDA and  has been authorized for detection and/or diagnosis of SARS-CoV-2 by FDA under an Emergency Use Authorization (EUA). This EUA will remain  in effect (meaning this test can be used) for the duration of the COVID-19 declaration under Section 56 4(b)(1) of the Act, 21 U.S.C. section 360bbb-3(b)(1), unless the authorization is terminated or revoked sooner. Performed at [facility], [address] [place], [place] [address]     Procedures and diagnostic studies:  DG Chest 2 View  Result Date: 12/21/2019 CLINICAL DATA:  Shortness of breath EXAM: CHEST - 2 VIEW COMPARISON:  08/19/2019 FINDINGS: Chronic bronchitic changes. Hazy right infrahilar atelectasis or mild infiltrate. Stable cardiomediastinal silhouette with aortic atherosclerosis. No pneumothorax. IMPRESSION: 1. Hazy right infrahilar atelectasis or potential developing infiltrate 2. Chronic bronchitic changes Electronically Signed   By: Jasmine Pang M.D.   On: 12/21/2019 19:11   CT Angio Chest PE W and/or Wo Contrast  Result Date: 12/22/2019 CLINICAL DATA:   Shortness of breath. Pulmonary embolism (PE), suspected EXAM: CT ANGIOGRAPHY CHEST WITH CONTRAST TECHNIQUE: Multidetector CT imaging of the chest was performed using the standard protocol during bolus administration of intravenous contrast. Multiplanar CT image reconstructions and MIPs were obtained to evaluate the vascular anatomy. CONTRAST:  25mL OMNIPAQUE IOHEXOL 350 MG/ML SOLN COMPARISON:  Radiograph yesterday. Chest CTA 08/19/2019 FINDINGS: Cardiovascular: Excellent opacification of pulmonary arteries. There are no filling defects within the pulmonary arteries to suggest pulmonary embolus. Mild aortic atherosclerosis. No aortic aneurysm. Cannot assess for dissection given phase of contrast tailored to pulmonary artery evaluation. There are coronary artery calcifications. Multi chamber cardiomegaly. Contrast refluxes into the hepatic veins and IVC. Mediastinum/Nodes: Small mediastinal lymph nodes not enlarged by size criteria. Ill-defined density in the hilar regions likely nodal tissue, not pathologically enlarged. No esophageal wall thickening. No suspicious thyroid nodule. Lungs/Pleura: Severe bronchial thickening which leads to bronchial occlusion in the peripheral branches. Subpleural atelectasis in the left lower lobe. Minimal linear scarring in the left upper lobe. 7 mm subpleural nodule in the left upper lobe series 6, image 54 is unchanged from prior. No endobronchial lesion. No evidence of pneumonia. Mild emphysema. No pleural fluid. No pulmonary edema. No pulmonary mass. Upper Abdomen: Contrast refluxing into the hepatic veins and IVC. No other acute findings. Musculoskeletal: Multilevel degenerative change in the spine. There are no acute or suspicious osseous abnormalities. Review of the MIP images confirms the above findings. IMPRESSION: 1. No pulmonary embolus. 2. Severe bronchial thickening which leads to bronchial occlusion in the peripheral branches. No evidence of pneumonia. 3. Multi chamber  cardiomegaly with reflux of contrast into the hepatic veins and IVC consistent with elevated right heart pressures. Coronary artery calcifications. 4. Unchanged 7 mm subpleural left upper lobe pulmonary nodule from prior exam. Non-contrast chest CT at 6-12 months is recommended. If the nodule is stable at time of repeat CT, then future CT at 18-24 months (from today's scan) is considered optional for low-risk patients, but is recommended for high-risk patients. This recommendation follows the consensus statement: Guidelines for Management of Incidental Pulmonary Nodules Detected on CT Images: From the Fleischner Society 2017; Radiology 2017; 284:228-243. Aortic Atherosclerosis (ICD10-I70.0) and Emphysema (ICD10-J43.9). Electronically Signed   By: Narda Rutherford M.D.   On: 12/22/2019 02:32   CARDIAC CATHETERIZATION  Result Date: 12/23/2019 1. Low filling pressures after diuresis. 2. Low cardiac index at 1.8. 3. Nonobstructive mild coronary  disease, nonischemic cardiomyopathy.   MR CARDIAC MORPHOLOGY W WO CONTRAST  Result Date: 12/23/2019 CLINICAL DATA:  Nonischemic cardiomyopathy EXAM: CARDIAC MRI TECHNIQUE: The patient was scanned on a 1.5 Tesla GE magnet. A dedicated cardiac coil was used. Functional imaging was done using Fiesta sequences. 2,3, and 4 chamber views were done to assess for RWMA's. Modified Simpson's rule using a short axis stack was used to calculate an ejection fraction on a dedicated work [employer]. The patient received 10 cc of Gadavist. After 10 minutes inversion recovery sequences were used to assess for infiltration and scar tissue. CONTRAST:  Gadavist 10 cc FINDINGS: Limited images of the lung fields showed no gross abnormalities. The left ventricle is moderately dilated with mild LV hypertrophy. Diffuse severe hypokinesis, EF 16%. Normal right ventricular size with moderate systolic dysfunction, EF 31%. Mild to moderate left atrial enlargement. Normal right  atrial size. Trileaflet aortic valve with no significant regurgitation or stenosis. Trivial mitral regurgitation. On delayed enhancement imaging, there was mid-wall late gadolinium enhancement (LGE) extensively in the basal to mid anteroseptal and inferoseptal walls as well as significant LGE at the anteroseptal and inferoseptal RV insertion sites. Biatrial LGE was also noted. Measurements: LVEDV 283 mL LVSV 46 mL LVEF 16% RVEDV 171 mL RVSV 52 mL RVEF 31% ECV 38% in septum IMPRESSION: 1. Moderately dilated LV with mild LV hypertrophy. EF 16%, diffuse hypokinesis. 2. Normal right ventricular size with moderately decreased systolic function, EF 31%. 3. Non-coronary LGE pattern, primarily mid-wall septal involvement. This could be consistent with prior myocarditis or sarcoidosis, less likely cardiac amyloidosis. ECV percentage is increased in the septum, but does not appear to be in the amyloidosis range (>40%). Dalton Mclean Electronically Signed   By: Marca Ancona M.D.   On: 12/23/2019 16:58    Medications:   . amiodarone  400 mg Oral BID  . aspirin EC  81 mg Oral Daily  . carvedilol  6.25 mg Oral BID WC  . digoxin  0.125 mg Oral Daily  . [START ON 12/24/2019] enoxaparin (LOVENOX) injection  40 mg Subcutaneous Q24H  . guaiFENesin  1,200 mg Oral BID  . predniSONE  4 mg Oral Q breakfast  . sacubitril-valsartan  1 tablet Oral BID  . sodium chloride flush  3 mL Intravenous Once  . sodium chloride flush  3 mL Intravenous Q12H  . sodium chloride flush  3 mL Intravenous Q12H  . sodium chloride flush  3 mL Intravenous Q12H  . spironolactone  12.5 mg Oral Daily   Continuous Infusions: . sodium chloride    . sodium chloride 250 mL (12/23/19 1652)     LOS: 1 day   Liborio Nixon MD [facility]   How to contact the [facility] Attending or Consulting provider 7A - 7P or covering provider during after hours 7P -7A, for this patient?  1. Check the care team in [facility] and look for a) attending/consulting  TRH provider listed and b) the [facility] team listed 2. Log into [email or web address] and use [facility]'s universal password to access. If you do not have the password, please contact the hospital operator. 3. Locate the [facility] provider you are looking for under [facility] and page to a number that you can be directly reached. 4. If you still have difficulty reaching the provider, please page the [facility] (Director on Call) for the Hospitalists listed on amion for assistance.  12/23/2019, 6:25 PM

## 2019-12-23 NOTE — TOC Progression Note (Signed)
Transition of Care [facility]) - Progression Note    Patient Details  Name: Brandon Frye MRN: [id number] Date of Birth: 01-15-1946  Transition of [facility]) CM/SW Contact  Leone Haven, RN Phone Number: 12/23/2019, 5:04 PM  Clinical Narrative:    Faxed information to Alvino Chapel with [employer] for life vest. H/p , cath note, progress notes.         Expected Discharge Plan and Services                                                 Social Determinants of Health (SDOH) Interventions    Readmission Risk Interventions No flowsheet data found.

## 2019-12-23 NOTE — Progress Notes (Signed)
4818 patient left unit for cath lab. Received report from [facility] in cath lab; right radial and right brachial. TR band applied.   Returned at [employer]. RN To bedside to assess, A&Ox4, no pain, resting, educated on maitaining arm elevated. TR band in tact with no drainage observed. Koban wrap in place at brachial site. No pain.   1007 received call from MRI inquiring if patient appropriate to transport; informed will callback once appropriate for transport since returned from cath lab at 1000 and need to complete necessary assessments.   Paged CHF team at 1039.Pt. frustrated/inquiring why on clear liquid diet? Upon callback PA Simmons at 1046 clear liquid likely due to cardiac MRI and can likely return to regular diet upon MRI completion. Patient informed.   1110 spoke with MRI Charla and informed patient has returned from cath lab, cath at right radial with TR band with no air removed and restricted movement of right arm. Transport to arrive shortly  1138 RN arrived to bedside, patient agitated threatening to go to cafeteria. Patient frustrated not yet gone to MRI, hungry and dissatisfied with clear liquid diet. RN offered comfort/reassurance about plan of care.   Left unit for MRI at 1150.   1310 daughter arrived to bedside.   Patient return from MRI approx [phone]  Removed band at 1530, no bleeding observed. No pain at side, no discomfort. neurovasc check WDL. Transparent dressing and gauze applied.   Followup with dietary at 1700; patient yet to receive lunch tray with order placed at approx [phone]

## 2019-12-23 NOTE — Consult Note (Addendum)
Cardiology Consultation:   Patient ID: Brandon Frye MRN: [id number]; DOB: 1946-10-10  Admit date: 12/21/2019 Date of Consult: 12/23/2019  Primary Care Provider: [facility] Primary Electrophysiologist:  None    Patient Profile:   Brandon Frye is a 74 y.o. 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 Shirlee Latch.  History of Present Illness:   Brandon Frye of late with worsening SOB, symptoms of orthopnea/PND, and a syncopal event a few weeks ago, he was seen at his [place] center planned for echocardiogram, and nuclear stress test, and a 2-week ZIO monitor  He was called by the center and referred to the ER 2/2 his monitor findings of recurrent NSVT episodes (longest 9.1seconds), he was admitted to [facility] 12/21/2019, he was started on IV lasix, AHF team added Entresto and adactone to his regime, and PO amiodarone 400mg  BID He underwent LHC today noting no significant or obstructive CAD. His echo at the [place] mentioned bullseye pattern on strain imaging concerning for amyloidosis. In d/w dr. Shirlee Latch, he has low suspicion though planned for c.MRI to be done  EP is asked to evaluate for ICD given multiple runs of NSVT and h/o syncope.   The patient denies any prior hx of syncope, dizzy spells, near syncope. He reports that he was in his kitchen got into a coughing fit and fainted, woke on the floor, his perception only moments later.  No CP, palpitations before or after. He denies any kind of cardiac symptoms, no CP, palpitations or cardiac awareness of any kind historically or currently The SOB was new for him in the last several weeks, previously denies any. He was aware of some kind of heart history or weakening, but never has had symptoms.  LABS K+ 3.7 > 3.3 > 5.2 Mag 1.5 > 1.8 BUN/Creat 10/1.03 >> 13/1.10 WBC 4.7  H/H 17/51 Plts 172  COVID negative  Home meds included coreg, no ACE/ARB The patient tells me he is actively only taking the Xarelto and ASA.  Only 2 pills daily, nothing else, and if he was supposed to be taking others he wasn't given them.   Heart Pathway Score:     Past Medical History:  Diagnosis Date  . Arthritis   . Chronic systolic CHF (congestive heart failure) (HCC)   . Non-ischemic cardiomyopathy (HCC) 2017   clean cath, EF 30%    Past Surgical History:  Procedure Laterality Date  . CARDIAC CATHETERIZATION  [place] EF 30%  . KNEE SURGERY       Home Medications:  Prior to Admission medications   Medication Sig Start Date End Date Taking? Authorizing Provider  aspirin EC 81 MG tablet Take 81 mg by mouth daily.   Yes [provider]  carvedilol (COREG) 12.5 MG tablet Take 6.25 mg by mouth 2 (two) times daily with a meal.   Yes [provider]  Ipratropium-Albuterol (COMBIVENT RESPIMAT) 20-100 MCG/ACT AERS respimat Inhale 1 puff into the lungs every 6 (six) hours as needed for wheezing or shortness of breath (or coughing). 04/10/17  Yes [name], MD  nitroGLYCERIN (NITROSTAT) 0.4 MG SL tablet Place 0.4 mg under the tongue every 5 (five) minutes as needed for chest pain.   Yes [provider]  predniSONE (DELTASONE) 50 MG tablet Take 1 tablet (50 mg total) by  mouth daily. 04/10/17  Yes [name], MD  rivaroxaban (XARELTO) 10 MG TABS tablet Take 10 mg by mouth daily.   Yes [provider]    Inpatient Medications: Scheduled Meds: . amiodarone  400 mg Oral BID  . aspirin EC  81 mg Oral Daily  . carvedilol  6.25 mg Oral BID WC  . digoxin  0.125 mg Oral Daily  . [START ON 12/24/2019] enoxaparin (LOVENOX) injection  40 mg Subcutaneous Q24H  . guaiFENesin  1,200 mg Oral BID  . predniSONE  4 mg Oral Q breakfast  . sacubitril-valsartan  1 tablet Oral BID  . sodium chloride flush  3 mL Intravenous Once  .  sodium chloride flush  3 mL Intravenous Q12H  . sodium chloride flush  3 mL Intravenous Q12H  . sodium chloride flush  3 mL Intravenous Q12H  . spironolactone  12.5 mg Oral Daily   Continuous Infusions: . sodium chloride    . sodium chloride     PRN Meds: sodium chloride, sodium chloride, acetaminophen, acetaminophen, hydrALAZINE, ipratropium-albuterol, labetalol, ondansetron (ZOFRAN) IV, ondansetron (ZOFRAN) IV, sodium chloride flush, sodium chloride flush  Allergies:   No Known Allergies  Social History:   Social History   Socioeconomic History  . Marital status: Single    Spouse name: Not on file  . Number of children: Not on file  . Years of education: Not on file  . Highest education level: Not on file  Occupational History  . Not on file  Tobacco Use  . Smoking status: Current Every Day Smoker    Packs/day: 1.00    Years: 25.00    Pack years: 25.00    Types: Cigarettes  . Smokeless tobacco: Never Used  Substance and Sexual Activity  . Alcohol use: No  . Drug use: No  . Sexual activity: Not on file  Other Topics Concern  . Not on file  Social History Narrative  . Not on file   Social Determinants of Health   Financial Resource Strain:   . Difficulty of Paying Living Expenses: Not on file  Food Insecurity:   . Worried About Programme researcher, broadcasting/film/video in the Last Year: Not on file  . Ran Out of Food in the Last Year: Not on file  Transportation Needs:   . Lack of Transportation (Medical): Not on file  . Lack of Transportation (Non-Medical): Not on file  Physical Activity:   . Days of Exercise per Week: Not on file  . Minutes of Exercise per Session: Not on file  Stress:   . Feeling of Stress : Not on file  Social Connections:   . Frequency of Communication with Friends and Family: Not on file  . Frequency of Social Gatherings with Friends and Family: Not on file  . Attends Religious Services: Not on file  . Active Member of Clubs or Organizations: Not on file   . Attends Banker Meetings: Not on file  . Marital Status: Not on file  Intimate Partner Violence:   . Fear of Current or Ex-Partner: Not on file  . Emotionally Abused: Not on file  . Physically Abused: Not on file  . Sexually Abused: Not on file    Family History:   Family History  Problem Relation Age of Onset  . Stroke Mother        Multiple strokes, died at 20  . Heart attack Father 41     ROS:  Please see the history of present illness.  All other ROS reviewed and negative.     Physical Exam/Data:   Vitals:   12/23/19 1000 12/23/19 1023 12/23/19 1042 12/23/19 1045  BP: 99/77 100/81 103/83 100/79  Pulse: 88 87 82 85  Resp:      Temp:      TempSrc:      SpO2: 95% 94% 100% 96%  Weight:        Intake/Output Summary (Last 24 hours) at 12/23/2019 1052 Last data filed at 12/23/2019 0825 Gross per 24 hour  Intake 779.35 ml  Output 1425 ml  Net -645.65 ml   Last 3 Weights 12/23/2019 08/19/2019 04/09/2017  Weight (lbs) 174 lb 6.4 oz 185 lb 185 lb  Weight (kg) 79.107 kg 83.915 kg 83.915 kg     Body mass index is 23.01 kg/m.  General:  Well nourished, well developed, in no acute distress HEENT: normal Lymph: no adenopathy Neck: no JVD Endocrine:  No thryomegaly Vascular: No carotid bruits  Cardiac:  RRR; no murmurs, gallops or rubs Lungs:  CTA b/l, no wheezing, rhonchi or rales  Abd: soft, nontender Ext: no edema Musculoskeletal:  No deformities Skin: warm and dry  Neuro:  No gross focal abnormalities noted Psych:  Normal affect   EKG:  The EKG was personally reviewed and demonstrates:   ST 107bpm,  QRS , poor anterior R progression, lat T changes, no ST changes   Telemetry:  Telemetry was personally reviewed and demonstrates:   SR 70's-80's     Relevant CV Studies:   12/23/2019: R/LHC 1. Low filling pressures after diuresis.  2. Low cardiac index at 1.8.  3. Nonobstructive mild coronary disease, nonischemic cardiomyopathy.    ([facility]) ECHO: 12/20/2019 Mod dilated LV w/ EF 25-30%, myocardial strain bullseye imaging>> ?cardiac amyloid Longitudinal strain -6.7% Indeterminate diastolic function <50% IVC insp collapse>>elevated R atrial pressure PASP est 44 mmHg  NUCLEAR STRESS TEST: 12/20/2019 Tiny fixed apical defect, likely attenuation artifact No ischemia EF 25%  ZIO MONITOR: 2 weeks, ending 12/09/2019 40 episodes VT, longest 9.1 sec, SR, SVT Min HR 42, max HR 285   10/21/2016: stress echo [facility]) Interpretation Summary Moderate workload achieved. Resting images c/w cardiomyopathy LVF improves with exercise. Abnormal resting wall motion consistent with old infarction. No new stress-induced wall motion abnormalities. 1. Adequate negative graded exercise treadmill test. 2. Baseline ECG c/w possible old ASMI. 3. 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 4. Intermediate risk study (old MI and cardiomyopathy). 5. Medical therapy recommended Left Ventricle The left ventricle is normal in size. There is mild concentric left ventricular hypertrophy. Left ventricular systolic function is moderately reduced. Ejection Fraction = 30-35%. There is mild global hypokinesis of the left ventricle. There is basal Inferior wall hypokinesis. There is basal septal wall hypokinesis.  Right Ventricle The right ventricle is normal size. The right ventricular systolic function is normal.   Atria The left atrial size is normal. Right atrial size is normal.  Mitral Valve The mitral valve is normal in structure and function. There is mild mitral regurgitation.  Tricuspid Valve The tricuspid valve is normal. No tricuspid regurgitation.  Aortic Valve The aortic valve is normal in structure and function. No aortic regurgitation is present.  Pulmonic Valve The pulmonic valve leaflets are thin and pliable; valve motion is normal. There is no pulmonic valvular  regurgitation.  Vessels The aortic root is normal size.   Pericardium There is no pericardial effusion.  Laboratory Data:  High Sensitivity Troponin:  Recent Labs  Lab 12/21/19 1834 12/21/19 2047  TROPONINIHS 28* 29*     Chemistry Recent Labs  Lab 12/21/19 1834 12/22/19 0909 12/23/19 0501  NA 140 139 138  K 3.7 3.3* 5.2*  CL 104 99 99  CO2 26 31 25   GLUCOSE 104* 122* 120*  BUN 10 11 13   CREATININE 1.03 1.05 1.10  CALCIUM 9.2 8.9 9.2  GFRNONAA >60 >60 >60  GFRAA >60 >60 >60  ANIONGAP 10 9 14     Recent Labs  Lab 12/22/19 0305  PROT 6.6  ALBUMIN 3.1*  AST 32  ALT 28  ALKPHOS 62  BILITOT 1.2   Hematology Recent Labs  Lab 12/21/19 1834 12/22/19 0909 12/23/19 0501  WBC 6.1 4.7 4.7  RBC 5.50 5.43 5.70  HGB 16.6 16.4 17.0  HCT 49.5 49.3 51.7  MCV 90.0 90.8 90.7  MCH 30.2 30.2 29.8  MCHC 33.5 33.3 32.9  RDW 13.4 13.5 13.5  PLT 194 182 172   BNP Recent Labs  Lab 12/22/19 0127  BNP 992.9*    DDimer No results for input(s): DDIMER in the last 168 hours.   Radiology/Studies:   DG Chest 2 View Result Date: 12/21/2019 CLINICAL DATA:  Shortness of breath EXAM: CHEST - 2 VIEW COMPARISON:  08/19/2019 FINDINGS: Chronic bronchitic changes. Hazy right infrahilar atelectasis or mild infiltrate. Stable cardiomediastinal silhouette with aortic atherosclerosis. No pneumothorax. IMPRESSION: 1. Hazy right infrahilar atelectasis or potential developing infiltrate 2. Chronic bronchitic changes Electronically Signed   By: Donavan Foil M.D.   On: 12/21/2019 19:11    CT Angio Chest PE W and/or Wo Contrast Result Date: 12/22/2019 CLINICAL DATA:  Shortness of breath. Pulmonary embolism (PE), suspected EXAM: CT ANGIOGRAPHY CHEST WITH CONTRAST TECHNIQUE: Multidetector CT imaging of the chest was performed using the standard protocol during bolus administration of intravenous contrast. Multiplanar CT image reconstructions and MIPs were obtained to evaluate the vascular  anatomy. CONTRAST:  66mL OMNIPAQUE IOHEXOL 350 MG/ML SOLN COMPARISON:  Radiograph yesterday. Chest CTA 08/19/2019 FINDINGS: Cardiovascular: Excellent opacification of pulmonary arteries. There are no filling defects within the pulmonary arteries to suggest pulmonary embolus. Mild aortic atherosclerosis. No aortic aneurysm. Cannot assess for dissection given phase of contrast tailored to pulmonary artery evaluation. There are coronary artery calcifications. Multi chamber cardiomegaly. Contrast refluxes into the hepatic veins and IVC. Mediastinum/Nodes: Small mediastinal lymph nodes not enlarged by size criteria. Ill-defined density in the hilar regions likely nodal tissue, not pathologically enlarged. No esophageal wall thickening. No suspicious thyroid nodule. Lungs/Pleura: Severe bronchial thickening which leads to bronchial occlusion in the peripheral branches. Subpleural atelectasis in the left lower lobe. Minimal linear scarring in the left upper lobe. 7 mm subpleural nodule in the left upper lobe series 6, image 54 is unchanged from prior. No endobronchial lesion. No evidence of pneumonia. Mild emphysema. No pleural fluid. No pulmonary edema. No pulmonary mass. Upper Abdomen: Contrast refluxing into the hepatic veins and IVC. No other acute findings. Musculoskeletal: Multilevel degenerative change in the spine. There are no acute or suspicious osseous abnormalities. Review of the MIP images confirms the above findings. IMPRESSION: 1. No pulmonary embolus. 2. Severe bronchial thickening which leads to bronchial occlusion in the peripheral branches. No evidence of pneumonia. 3. Multi chamber cardiomegaly with reflux of contrast into the hepatic veins and IVC consistent with elevated right heart pressures. Coronary artery calcifications. 4. Unchanged 7 mm subpleural left upper lobe pulmonary nodule from prior exam. Non-contrast chest CT at 6-12 months is recommended. If the nodule is  stable at time of repeat CT,  then future CT at 18-24 months (from today's scan) is considered optional for low-risk patients, but is recommended for high-risk patients. This recommendation follows the consensus statement: Guidelines for Management of Incidental Pulmonary Nodules Detected on CT Images: From the Fleischner Society 2017; Radiology 2017; 284:228-243. Aortic Atherosclerosis (ICD10-I70.0) and Emphysema (ICD10-J43.9). Electronically Signed   By: Narda Rutherford M.D.   On: 12/22/2019 02:32   {   Assessment and Plan:   1. Cough Syncope     Occurred in the context of a coughing fit  2. NSVT     Zio strips available are reviewed     He has been started on PO amiodarone, loading 400mg  BID Risks of amiodarone likely outweight benefits at this time.  Would stop amiodarone and uptitrate beta blocker as able. He has true NSVT of differing morphologies.  One strip 11beats is NSPMVT There is a longer lasting tachycardia, this does not have change in morphology and suspect this is an SVT, though can not r/o VT, this appeared to last 3 minutes.  The patient had no symptoms while wearing the monitor  3. NICM 4. Acute CHF (systolic)     This goes back to 2017     He has not been on GDMT     AHF team is managing     He feels much better   His syncopal event in the context of a coughing fit was likely vagal.  He has EF 25-30% He had NSVT on his monitor, some very fast and on was nonsustained PMVT, he appears to have SVT as well  He is pending c. MRI, this may adjust recommendations.  Dr. 2018 will see the patient later today     For questions or updates, please contact [facility] Please consult [email or web address] for contact info under    Signed, Johney Frame, PA-C  12/23/2019 10:52 AM   I have seen, examined the patient, and reviewed the above assessment and plan.  Changes to above are made where necessary.  On exam, RRR.    The patient has a h/o cough syncope (vagal) but no symptoms with ventricular  arrhythmias.  I would advise that we stop amiodarone and uptitrate beta blocker as able. He needs initiation and optimization of medical therapy.  The etiology of his cardiomyopathy remains unclear, with workup ongoing.  I would not advise ICD this admission but would consider after workup is complete and after full medical optimization.  The data to support lifevest in primary prevention for nonischemic CM would suggest that lifevest is not likely to be beneficial for this patient.  EP would not advise lifevest at this time.  Electrophysiology team to see as needed while here.   Please call with questions.    Co Sign: 12/25/2019, MD 12/23/2019 8:56 PM

## 2019-12-23 NOTE — Plan of Care (Signed)

## 2019-12-23 NOTE — Progress Notes (Signed)
Patient ID: Brandon Frye, male   DOB: 1946/02/06, 74 y.o.   MRN: [id number]     Advanced Heart Failure Rounding Note  PCP-Cardiologist: No primary care provider on file.   Subjective:    No VT overnight.  Reports vigorous diuresis.  No significant dyspnea.   RHC/LHC Coronary Findings  Diagnostic Dominance: Right Left Main  No significant coronary disease.  Left Anterior Descending  30% stenosis proximal LAD.  Ramus Intermedius  No significant coronary disease.  Left Circumflex  Luminal irregularities.  Right Coronary Artery  Luminal irregularities.  Intervention  No interventions have been documented. Right Heart  Right Heart Pressures RHC Procedural Findings: Hemodynamics (mmHg) RA mean 1 RV 35/3 PA 36/7, mean 20 PCWP mean 8 LV 85/14 AO 92/66  Oxygen saturations: PA 67% AO 99%  Cardiac Output (Fick) 3.65  Cardiac Index (Fick) 1.8     Objective:   Weight Range: 79.1 kg Body mass index is 23.01 kg/m.   Vital Signs:   Temp:  [97.9 F (36.6 C)-98.6 F (37 C)] 98.4 F (36.9 C) (02/19 0717) Pulse Rate:  [75-104] 87 (02/19 1023) Resp:  [14-23] 14 (02/19 0938) BP: (99-126)/(76-97) 100/81 (02/19 1023) SpO2:  [91 %-100 %] 94 % (02/19 1023) Weight:  [79.1 kg] 79.1 kg (02/19 0358) Last BM Date: 12/22/19  Weight change: Filed Weights   12/23/19 0358  Weight: 79.1 kg    Intake/Output:   Intake/Output Summary (Last 24 hours) at 12/23/2019 1025 Last data filed at 12/23/2019 0825 Gross per 24 hour  Intake 779.35 ml  Output 1425 ml  Net -645.65 ml      Physical Exam    General:  Well appearing. No resp difficulty HEENT: Normal Neck: Supple. JVP not elevated. Carotids 2+ bilat; no bruits. No lymphadenopathy or thyromegaly appreciated. Cor: PMI nondisplaced. Regular rate & rhythm. No rubs, gallops or murmurs. Lungs: Clear Abdomen: Soft, nontender, nondistended. No hepatosplenomegaly. No bruits or masses. Good bowel sounds. Extremities: No  cyanosis, clubbing, rash, edema Neuro: Alert & orientedx3, cranial nerves grossly intact. moves all 4 extremities w/o difficulty. Affect pleasant   Telemetry   NSR 90s (personally reviewed)  Labs    CBC Recent Labs    12/22/19 0909 12/23/19 0501  WBC 4.7 4.7  NEUTROABS 2.6  --   HGB 16.4 17.0  HCT 49.3 51.7  MCV 90.8 90.7  PLT 182 409   Basic Metabolic Panel Recent Labs    12/22/19 0909 12/23/19 0501  NA 139 138  K 3.3* 5.2*  CL 99 99  CO2 31 25  GLUCOSE 122* 120*  BUN 11 13  CREATININE 1.05 1.10  CALCIUM 8.9 9.2  MG 1.5* 1.8   Liver Function Tests Recent Labs    12/22/19 0305  AST 32  ALT 28  ALKPHOS 62  BILITOT 1.2  PROT 6.6  ALBUMIN 3.1*   No results for input(s): LIPASE, AMYLASE in the last 72 hours. Cardiac Enzymes No results for input(s): CKTOTAL, CKMB, CKMBINDEX, TROPONINI in the last 72 hours.  BNP: BNP (last 3 results) Recent Labs    12/22/19 0127  BNP 992.9*    ProBNP (last 3 results) No results for input(s): PROBNP in the last 8760 hours.   D-Dimer No results for input(s): DDIMER in the last 72 hours. Hemoglobin A1C No results for input(s): HGBA1C in the last 72 hours. Fasting Lipid Panel No results for input(s): CHOL, HDL, LDLCALC, TRIG, CHOLHDL, LDLDIRECT in the last 72 hours. Thyroid Function Tests No results for input(s): TSH, T4TOTAL,  T3FREE, THYROIDAB in the last 72 hours.  Invalid input(s): FREET3  Other results:   Imaging    CARDIAC CATHETERIZATION  Result Date: 12/23/2019 1. Low filling pressures after diuresis. 2. Low cardiac index at 1.8. 3. Nonobstructive mild coronary disease, nonischemic cardiomyopathy.      Medications:     Scheduled Medications: . amiodarone  400 mg Oral BID  . aspirin EC  81 mg Oral Daily  . carvedilol  6.25 mg Oral BID WC  . digoxin  0.125 mg Oral Daily  . [START ON 12/24/2019] enoxaparin (LOVENOX) injection  40 mg Subcutaneous Q24H  . guaiFENesin  1,200 mg Oral BID  .  predniSONE  4 mg Oral Q breakfast  . sacubitril-valsartan  1 tablet Oral BID  . sodium chloride flush  3 mL Intravenous Once  . sodium chloride flush  3 mL Intravenous Q12H  . sodium chloride flush  3 mL Intravenous Q12H  . sodium chloride flush  3 mL Intravenous Q12H  . spironolactone  12.5 mg Oral Daily     Infusions: . sodium chloride    . sodium chloride       PRN Medications:  sodium chloride, sodium chloride, acetaminophen, acetaminophen, hydrALAZINE, ipratropium-albuterol, labetalol, ondansetron (ZOFRAN) IV, ondansetron (ZOFRAN) IV, sodium chloride flush, sodium chloride flush    Assessment/Plan   1. Acute on chronic systolic CHF: Cath in 2017 showed nonobstructive CAD in setting of EF 30%.  Echo in 2/21 at [facility] showed EF 25-30% with bullseye pattern on strain imaging concerning for amyloidosis.  No family history of cardiomyopathy but father had MI at age 69.  Increased exertional symptoms as well as orthopnea for 2-3 weeks, no inciting event. HS-TnI in 20s range with no trend. RHC/LHC today showed nonobstructive CAD, good filling pressures after diuresis, and low cardiac index at 1.8.    - Can hold Lasix today, resume po tomorrow.   - Continue home Coreg.  - Continue spironolactone 12.5 daily.  - Continue Entresto 24/26 bid.  - With cath showing no significant CAD, he will need a cardiac MRI to look for infiltrative disease like amyloidosis (bullseye pattern reported on strain imaging at [facility] is concerning).  Have sent off myeloma panel and urine immunofixation.  2. Syncope: Multiple NSVT runs on 2 wk Zio patch, up to 9 seconds.  I suspect that the syncopal episode 2-3 weeks ago was arrhythmic.  No further VT overnight on amiodarone.  - Started amiodarone 400 mg bid.  - No significant CAD on cath, will consult EP re: ICD.   Length of Stay: 1  Marca Ancona, MD  12/23/2019, 10:25 AM  Advanced Heart Failure Team Pager [phone] (M-F; 7a - 4p)  Please contact [facility] for night-coverage after hours (4p -7a ) and weekends on [email or web address]

## 2019-12-23 NOTE — TOC Progression Note (Addendum)
Transition of Care [facility]) - Progression Note    Patient Details  Name: Brandon Frye MRN: [id number] Date of Birth: 1946-02-04  [facility] CM/SW Contact  Leone Haven, RN Phone Number: 12/23/2019, 4:15 PM  Clinical Narrative:    NCM spoke with patient he states he would like to have HHRN, NCM offered choice he states he does not have a preference.  NCM made referral to [facility] with [facility].  Kandee Keen states he will call this NCM back.  Awaiting call back.  [facility].  Dr. Cain Saupe is PCP at [facility], fax number is [phone].  CSW is [employer] pager [phone], phone [phone] ext [phone]. Cory with [facility] states he is unable to staff referral .  NCM made referral to Tiffany with [facility], she states she will let the weekend staff know if they can take referral for [facility] for CHF .          Expected Discharge Plan and Services                                                 Social Determinants of Health (SDOH) Interventions    Readmission Risk Interventions No flowsheet data found.

## 2019-12-24 ENCOUNTER — Inpatient Hospital Stay (HOSPITAL_COMMUNITY): Payer: No Typology Code available for payment source

## 2019-12-24 DIAGNOSIS — R55 Syncope and collapse: Secondary | ICD-10-CM

## 2019-12-24 LAB — BASIC METABOLIC PANEL
Anion gap: 9 (ref 5–15)
BUN: 16 mg/dL (ref 8–23)
CO2: 25 mmol/L (ref 22–32)
Calcium: 9 mg/dL (ref 8.9–10.3)
Chloride: 101 mmol/L (ref 98–111)
Creatinine, Ser: 1.23 mg/dL (ref 0.61–1.24)
GFR calc Af Amer: >60 mL/min (ref >60)
GFR calc non Af Amer: 59 mL/min — ABNORMAL LOW (ref >60)
Glucose, Bld: 113 mg/dL — ABNORMAL HIGH (ref 70–99)
Potassium: 4.4 mmol/L (ref 3.5–5.1)
Sodium: 135 mmol/L (ref 135–145)

## 2019-12-24 LAB — CBC
HCT: 50.9 % (ref 39.0–52.0)
Hemoglobin: 17.3 g/dL — ABNORMAL HIGH (ref 13.0–17.0)
MCH: 30 pg (ref 26.0–34.0)
MCHC: 34 g/dL (ref 30.0–36.0)
MCV: 88.4 fL (ref 80.0–100.0)
Platelets: 194 10*3/uL (ref 150–400)
RBC: 5.76 MIL/uL (ref 4.22–5.81)
RDW: 13.1 % (ref 11.5–15.5)
WBC: 5.3 10*3/uL (ref 4.0–10.5)
nRBC: 0 % (ref 0.0–0.2)

## 2019-12-24 LAB — ANGIOTENSIN CONVERTING ENZYME: Angiotensin-Converting Enzyme: 40 U/L (ref 14–82)

## 2019-12-24 LAB — MAGNESIUM: Magnesium: 2 mg/dL (ref 1.7–2.4)

## 2019-12-24 MED ORDER — SPIRONOLACTONE 25 MG PO TABS
25.0000 mg | ORAL_TABLET | Freq: Every day | ORAL | Status: DC
  Administered 2019-12-25 – 2019-12-27 (×3): 25 mg via ORAL
  Filled 2019-12-24 (×3): qty 1

## 2019-12-24 MED ORDER — FUROSEMIDE 20 MG PO TABS
20.0000 mg | ORAL_TABLET | Freq: Every day | ORAL | Status: DC
  Administered 2019-12-24 – 2019-12-27 (×4): 20 mg via ORAL
  Filled 2019-12-24 (×4): qty 1

## 2019-12-24 MED ORDER — SPIRONOLACTONE 12.5 MG HALF TABLET
12.5000 mg | ORAL_TABLET | Freq: Once | ORAL | Status: AC
  Administered 2019-12-24: 12.5 mg via ORAL
  Filled 2019-12-24: qty 1

## 2019-12-24 NOTE — Progress Notes (Signed)
Progress Note    Brandon Frye  VFI:[id number] DOB: 1946/05/04  DOA: 12/21/2019 PCP: [facility]    Brief Narrative:    Medical records reviewed and are as summarized below:  Brandon Frye is a very pleasant 74 y.o. male veteran who served in [place] presented 2/18 after being instructed by cardiology at [facility] to go to ED as result of echo results that are urgent. Hx cardiomyopathy. He had been experiencing worsening sob and orthopnea. He denies CP but endorses worsening DOE and cough with clear sputum production.  Admitted for further work-up for systolic heart failure.  Cardiology on board.  Assessment/Plan:   Principal Problem:   Acute on chronic congestive heart failure (HCC) Active Problems:   Cardiomyopathy ([place])   COPD (chronic obstructive pulmonary disease) (HCC)   Hypertensive urgency   Lung nodule   Arthritis   Heart failure (HCC)   Syncope   NSVT (nonsustained ventricular tachycardia) ([place])  Acute on chronic decompensated systolic heart failure  Echo in 2/21 at the [facility] showed EF 25 to 30% with bull's-eye pattern on strain imaging Coronary angiogram and right heart cath on 2/19 showed non obstructive CAD Cardiac MRI showed LVEF 16%, non coronary LGE pattern which  Heart failure, EP on board could be consistent with previous myocarditis or sarcoidosis, less likely cardiac a amyloidosis.  HRCT, ACE levels, multiple myeloma panel, urine immunofixation ordered for further work-up by heart failure team. Continue Coreg, spironolactone, Entresto.  Restarted on p.o. Lasix Amiodarone stopped by EP.  On digoxin.  Syncope thought to be in setting of coughing spell. Tentative plan for GDMT and reassess with repeat echo in 3 months LifeVest per cardiology  Hypertensive urgency Improved with diuresis  COPD He stopped smoking 1 month ago.  Is not on home oxygen.  Oxygen saturation level greater than 90% on room air.  No wheezing on exam.  CTA  reveals severe bronchial thickening which leads to bronchial occlusion and peripheral branches no evidence of pneumonia -Continue home Combivent -Monitor oxygen saturation level -Supplemental oxygen as indicated  Lung nodule Incidental finding on CTA in the emergency department.  Report says unchanged 7 mm subpleural left upper lobe pulmonary nodule from prior exam.  Recommend noncontrast chest CT 6 to 12 months.  Family Communication/Anticipated D/C date and plan/Code Status   DVT prophylaxis: Lovenox, prophylactic dose Code Status: Full Code.  Family Communication: No family at bedside Disposition Plan: Plan for discharge home once cleared by cardiology   Medical Consultants:    cardiology   Anti-Infectives:    None  Subjective:   Feels much better.  No new complaints.   Objective:    Vitals:   12/24/19 0748 12/24/19 1122 12/24/19 1407 12/24/19 1420  BP: 116/80 94/75 93/70   Pulse: 84 77 69   Resp: 20 20    Temp: 98.7 F (37.1 C) 98.2 F (36.8 C) 97.8 F (36.6 C)   TempSrc: Oral Oral Oral   SpO2: 94% 94% 96%   Weight:      Height:    6' 1" (1.854 m)    Intake/Output Summary (Last 24 hours) at 12/24/2019 1425 Last data filed at 12/24/2019 1300 Gross per 24 hour  Intake 859.31 ml  Output 900 ml  Net -40.69 ml   Filed Weights   12/23/19 0358 12/24/19 0428  Weight: 79.1 kg 79.2 kg    Exam: General: Awake alert well-nourished in no acute distress CV regular rate and rhythm, no lower extremity edema Respiratory:  No increased work of breathing breath sounds.  No wheeze no rhonchi or crackles. Abdomen: Nondistended soft positive bowel sounds throughout nontender to palpation no guarding or rebounding Musculoskeletal: Joints without swelling/erythema full range of motion Neuro: Alert and oriented x3 speech clear facial symmetry  Data Reviewed:   I have personally reviewed following labs and imaging studies:  Labs: Labs show the following:   Basic  Metabolic Panel: Recent Labs  Lab 12/21/19 1834 12/21/19 1834 12/22/19 0909 12/22/19 0909 12/23/19 0501 12/23/19 0501 12/23/19 0920 12/24/19 0516  NA 140  --  139  --  138  --  137 135  K 3.7   < > 3.3*   < > 5.2*   < > 4.5 4.4  CL 104  --  99  --  99  --   --  101  CO2 26  --  31  --  25  --   --  25  GLUCOSE 104*  --  122*  --  120*  --   --  113*  BUN 10  --  11  --  13  --   --  16  CREATININE 1.03  --  1.05  --  1.10  --   --  1.23  CALCIUM 9.2  --  8.9  --  9.2  --   --  9.0  MG  --   --  1.5*  --  1.8  --   --  2.0   < > = values in this interval not displayed.   GFR Estimated Creatinine Clearance: 61.7 mL/min (by C-G formula based on SCr of 1.23 mg/dL). Liver Function Tests: Recent Labs  Lab 12/22/19 0305  AST 32  ALT 28  ALKPHOS 62  BILITOT 1.2  PROT 6.6  ALBUMIN 3.1*   No results for input(s): LIPASE, AMYLASE in the last 168 hours. No results for input(s): AMMONIA in the last 168 hours. Coagulation profile No results for input(s): INR, PROTIME in the last 168 hours.  CBC: Recent Labs  Lab 12/21/19 1834 12/22/19 0909 12/23/19 0501 12/23/19 0920 12/24/19 0516  WBC 6.1 4.7 4.7  --  5.3  NEUTROABS  --  2.6  --   --   --   HGB 16.6 16.4 17.0 18.4* 17.3*  HCT 49.5 49.3 51.7 54.0* 50.9  MCV 90.0 90.8 90.7  --  88.4  PLT 194 182 172  --  194   Cardiac Enzymes: No results for input(s): CKTOTAL, CKMB, CKMBINDEX, TROPONINI in the last 168 hours. BNP (last 3 results) No results for input(s): PROBNP in the last 8760 hours. CBG: No results for input(s): GLUCAP in the last 168 hours. D-Dimer: No results for input(s): DDIMER in the last 72 hours. Hgb A1c: No results for input(s): HGBA1C in the last 72 hours. Lipid Profile: No results for input(s): CHOL, HDL, LDLCALC, TRIG, CHOLHDL, LDLDIRECT in the last 72 hours. Thyroid function studies: No results for input(s): TSH, T4TOTAL, T3FREE, THYROIDAB in the last 72 hours.  Invalid input(s): FREET3 Anemia  work up: No results for input(s): VITAMINB12, FOLATE, FERRITIN, TIBC, IRON, RETICCTPCT in the last 72 hours. Sepsis Labs: Recent Labs  Lab 12/21/19 1834 12/22/19 0909 12/23/19 0501 12/24/19 0516  WBC 6.1 4.7 4.7 5.3    Microbiology Recent Results (from the past 240 hour(s))  SARS CORONAVIRUS 2 (TAT 6-24 HRS) Nasopharyngeal Nasopharyngeal Swab     Status: None   Collection Time: 12/22/19  3:30 AM   Specimen: Nasopharyngeal Swab  Result  Value Ref Range Status   SARS Coronavirus 2 NEGATIVE NEGATIVE Final    Comment: (NOTE) SARS-CoV-2 target nucleic acids are NOT DETECTED. The SARS-CoV-2 RNA is generally detectable in upper and lower respiratory specimens during the acute phase of infection. Negative results do not preclude SARS-CoV-2 infection, do not rule out co-infections with other pathogens, and should not be used as the sole basis for treatment or other patient management decisions. Negative results must be combined with clinical observations, patient history, and epidemiological information. The expected result is Negative. Fact Sheet for Patients: SugarRoll.be Fact Sheet for Healthcare Providers: https://www.woods-mathews.com/ This test is not yet approved or cleared by the [place] FDA and  has been authorized for detection and/or diagnosis of SARS-CoV-2 by FDA under an Emergency Use Authorization (EUA). This EUA will remain  in effect (meaning this test can be used) for the duration of the COVID-19 declaration under Section 56 4(b)(1) of the Act, 21 U.S.C. section 360bbb-3(b)(1), unless the authorization is terminated or revoked sooner. Performed at [facility], [place] [address] [place], [place] [address]     Procedures and diagnostic studies:  CARDIAC CATHETERIZATION  Result Date: 12/23/2019 1. Low filling pressures after diuresis. 2. Low cardiac index at 1.8. 3. Nonobstructive mild coronary disease,  nonischemic cardiomyopathy.   MR CARDIAC MORPHOLOGY W WO CONTRAST  Result Date: 12/23/2019 CLINICAL DATA:  Nonischemic cardiomyopathy EXAM: CARDIAC MRI TECHNIQUE: The patient was scanned on a 1.5 Tesla GE magnet. A dedicated cardiac coil was used. Functional imaging was done using Fiesta sequences. 2,3, and 4 chamber views were done to assess for RWMA's. Modified Simpson's rule using a short axis stack was used to calculate an ejection fraction on a dedicated work Conservation officer, nature. The patient received 10 cc of Gadavist. After 10 minutes inversion recovery sequences were used to assess for infiltration and scar tissue. CONTRAST:  Gadavist 10 cc FINDINGS: Limited images of the lung fields showed no gross abnormalities. The left ventricle is moderately dilated with mild LV hypertrophy. Diffuse severe hypokinesis, EF 16%. Normal right ventricular size with moderate systolic dysfunction, EF 85%. Mild to moderate left atrial enlargement. Normal right atrial size. Trileaflet aortic valve with no significant regurgitation or stenosis. Trivial mitral regurgitation. On delayed enhancement imaging, there was mid-wall late gadolinium enhancement (LGE) extensively in the basal to mid anteroseptal and inferoseptal walls as well as significant LGE at the anteroseptal and inferoseptal RV insertion sites. Biatrial LGE was also noted. Measurements: LVEDV 283 mL LVSV 46 mL LVEF 16% RVEDV 171 mL RVSV 52 mL RVEF 31% ECV 38% in septum IMPRESSION: 1. Moderately dilated LV with mild LV hypertrophy. EF 16%, diffuse hypokinesis. 2. Normal right ventricular size with moderately decreased systolic function, EF 02%. 3. Non-coronary LGE pattern, primarily mid-wall septal involvement. This could be consistent with prior myocarditis or sarcoidosis, less likely cardiac amyloidosis. ECV percentage is increased in the septum, but does not appear to be in the amyloidosis range (>40%). Dalton Mclean Electronically Signed   By: Loralie Champagne M.D.   On: 12/23/2019 16:58    Medications:   . aspirin EC  81 mg Oral Daily  . carvedilol  6.25 mg Oral BID WC  . digoxin  0.125 mg Oral Daily  . enoxaparin (LOVENOX) injection  40 mg Subcutaneous Q24H  . furosemide  20 mg Oral Daily  . guaiFENesin  1,200 mg Oral BID  . predniSONE  4 mg Oral Q breakfast  . sacubitril-valsartan  1 tablet Oral BID  .  sodium chloride flush  3 mL Intravenous Once  . sodium chloride flush  3 mL Intravenous Q12H  . sodium chloride flush  3 mL Intravenous Q12H  . sodium chloride flush  3 mL Intravenous Q12H  . [START ON 12/25/2019] spironolactone  25 mg Oral Daily   Continuous Infusions: . sodium chloride    . sodium chloride Stopped (12/23/19 1918)     LOS: 2 days   Lucky Cowboy MD [facility]   How to contact the [facility] Attending or Consulting provider [place] or covering provider during after hours [place], for this patient?  1. Check the care team in [facility] and look for a) attending/consulting TR[facility] provider listed and b) the [facility] team listed 2. Log into [email or web address] and use [place]'s universal password to access. If you do not have the password, please contact the hospital operator. 3. Locate the [facility] provider you are looking for under [facility] and page to a number that you can be directly reached. 4. If you still have difficulty reaching the provider, please page the [facility] (Director on Call) for the Hospitalists listed on amion for assistance.  12/24/2019, 2:25 PM

## 2019-12-24 NOTE — Progress Notes (Signed)
Patient ID: Brandon Frye, male   DOB: [birth year], 74 y.o.   MRN: [id number]     Advanced Heart Failure Rounding Note  PCP-Cardiologist: No primary care provider on file.   Subjective:    No VT overnight.  No complaints today, feels good.   Cardiac MRI: 1. Moderately dilated LV with mild LV hypertrophy. EF 16%, diffuse hypokinesis. 2. Normal right ventricular size with moderately decreased systolic function, EF 31%. 3. Non-coronary LGE pattern, primarily mid-wall septal involvement. This could be consistent with prior myocarditis or sarcoidosis, less likely cardiac amyloidosis. ECV percentage is increased in the septum, but does not appear to be in the amyloidosis range (>40%).  RHC/LHC Coronary Findings  Diagnostic Dominance: Right Left Main  No significant coronary disease.  Left Anterior Descending  30% stenosis proximal LAD.  Ramus Intermedius  No significant coronary disease.  Left Circumflex  Luminal irregularities.  Right Coronary Artery  Luminal irregularities.  Intervention  No interventions have been documented. Right Heart  Right Heart Pressures RHC Procedural Findings: Hemodynamics (mmHg) RA mean 1 RV 35/3 PA 36/7, mean 20 PCWP mean 8 LV 85/14 AO 92/66  Oxygen saturations: PA 67% AO 99%  Cardiac Output (Fick) 3.65  Cardiac Index (Fick) 1.8     Objective:   Weight Range: 79.2 kg Body mass index is 23.02 kg/m.   Vital Signs:   Temp:  [97.6 F (36.4 C)-98.7 F (37.1 C)] 98.2 F (36.8 C) (02/20 1122) Pulse Rate:  [73-92] 77 (02/20 1122) Resp:  [18-20] 20 (02/20 1122) BP: (90-116)/(69-90) 94/75 (02/20 1122) SpO2:  [93 %-99 %] 94 % (02/20 1122) Weight:  [79.2 kg] 79.2 kg (02/20 0428) Last BM Date: 12/22/19  Weight change: Filed Weights   12/23/19 0358 12/24/19 0428  Weight: 79.1 kg 79.2 kg    Intake/Output:   Intake/Output Summary (Last 24 hours) at 12/24/2019 1124 Last data filed at 12/24/2019 0838 Gross per 24 hour   Intake 859.31 ml  Output 600 ml  Net 259.31 ml      Physical Exam    General: NAD Neck: No JVD, no thyromegaly or thyroid nodule.  Lungs: Clear to auscultation bilaterally with normal respiratory effort. CV: Nondisplaced PMI.  Heart regular S1/S2, no S3/S4, no murmur.  No peripheral edema.  No carotid bruit.  Normal pedal pulses.  Abdomen: Soft, nontender, no hepatosplenomegaly, no distention.  Skin: Intact without lesions or rashes.  Neurologic: Alert and oriented x 3.  Psych: Normal affect. Extremities: No clubbing or cyanosis.  HEENT: Normal.   Telemetry   NSR 70s-80s (personally reviewed)  Labs    CBC Recent Labs    12/22/19 0909 12/22/19 0909 12/23/19 0501 12/23/19 0501 12/23/19 0920 12/24/19 0516  WBC 4.7   < > 4.7  --   --  5.3  NEUTROABS 2.6  --   --   --   --   --   HGB 16.4   < > 17.0   < > 18.4* 17.3*  HCT 49.3   < > 51.7   < > 54.0* 50.9  MCV 90.8   < > 90.7  --   --  88.4  PLT 182   < > 172  --   --  194   < > = values in this interval not displayed.   Basic Metabolic Panel Recent Labs    78/24/23 0501 12/23/19 0501 12/23/19 0920 12/24/19 0516  NA 138   < > 137 135  K 5.2*   < > 4.5 4.4  CL 99  --   --  101  CO2 25  --   --  25  GLUCOSE 120*  --   --  113*  BUN 13  --   --  16  CREATININE 1.10  --   --  1.23  CALCIUM 9.2  --   --  9.0  MG 1.8  --   --  2.0   < > = values in this interval not displayed.   Liver Function Tests Recent Labs    12/22/19 0305  AST 32  ALT 28  ALKPHOS 62  BILITOT 1.2  PROT 6.6  ALBUMIN 3.1*   No results for input(s): LIPASE, AMYLASE in the last 72 hours. Cardiac Enzymes No results for input(s): CKTOTAL, CKMB, CKMBINDEX, TROPONINI in the last 72 hours.  BNP: BNP (last 3 results) Recent Labs    12/22/19 0127  BNP 992.9*    ProBNP (last 3 results) No results for input(s): PROBNP in the last 8760 hours.   D-Dimer No results for input(s): DDIMER in the last 72 hours. Hemoglobin A1C No  results for input(s): HGBA1C in the last 72 hours. Fasting Lipid Panel No results for input(s): CHOL, HDL, LDLCALC, TRIG, CHOLHDL, LDLDIRECT in the last 72 hours. Thyroid Function Tests No results for input(s): TSH, T4TOTAL, T3FREE, THYROIDAB in the last 72 hours.  Invalid input(s): FREET3  Other results:   Imaging    MR CARDIAC MORPHOLOGY W WO CONTRAST  Result Date: 12/23/2019 CLINICAL DATA:  Nonischemic cardiomyopathy EXAM: CARDIAC MRI TECHNIQUE: The patient was scanned on a 1.5 Tesla GE magnet. A dedicated cardiac coil was used. Functional imaging was done using Fiesta sequences. 2,3, and 4 chamber views were done to assess for RWMA's. Modified Simpson's rule using a short axis stack was used to calculate an ejection fraction on a dedicated work Research officer, trade [employer]. The patient received 10 cc of Gadavist. After 10 minutes inversion recovery sequences were used to assess for infiltration and scar tissue. CONTRAST:  Gadavist 10 cc FINDINGS: Limited images of the lung fields showed no gross abnormalities. The left ventricle is moderately dilated with mild LV hypertrophy. Diffuse severe hypokinesis, EF 16%. Normal right ventricular size with moderate systolic dysfunction, EF 31%. Mild to moderate left atrial enlargement. Normal right atrial size. Trileaflet aortic valve with no significant regurgitation or stenosis. Trivial mitral regurgitation. On delayed enhancement imaging, there was mid-wall late gadolinium enhancement (LGE) extensively in the basal to mid anteroseptal and inferoseptal walls as well as significant LGE at the anteroseptal and inferoseptal RV insertion sites. Biatrial LGE was also noted. Measurements: LVEDV 283 mL LVSV 46 mL LVEF 16% RVEDV 171 mL RVSV 52 mL RVEF 31% ECV 38% in septum IMPRESSION: 1. Moderately dilated LV with mild LV hypertrophy. EF 16%, diffuse hypokinesis. 2. Normal right ventricular size with moderately decreased systolic function, EF 31%. 3.  Non-coronary LGE pattern, primarily mid-wall septal involvement. This could be consistent with prior myocarditis or sarcoidosis, less likely cardiac amyloidosis. ECV percentage is increased in the septum, but does not appear to be in the amyloidosis range (>40%). Luba Matzen Electronically Signed   By: Marca Ancona M.D.   On: 12/23/2019 16:58     Medications:     Scheduled Medications: . aspirin EC  81 mg Oral Daily  . carvedilol  6.25 mg Oral BID WC  . digoxin  0.125 mg Oral Daily  . enoxaparin (LOVENOX) injection  40 mg Subcutaneous Q24H  . guaiFENesin  1,200 mg Oral BID  .  predniSONE  4 mg Oral Q breakfast  . sacubitril-valsartan  1 tablet Oral BID  . sodium chloride flush  3 mL Intravenous Once  . sodium chloride flush  3 mL Intravenous Q12H  . sodium chloride flush  3 mL Intravenous Q12H  . sodium chloride flush  3 mL Intravenous Q12H  . spironolactone  12.5 mg Oral Once  . [START ON 12/25/2019] spironolactone  25 mg Oral Daily    Infusions: . sodium chloride    . sodium chloride Stopped (12/23/19 1918)    PRN Medications: sodium chloride, sodium chloride, acetaminophen, acetaminophen, ipratropium-albuterol, ondansetron (ZOFRAN) IV, ondansetron (ZOFRAN) IV, sodium chloride flush, sodium chloride flush    Assessment/Plan   1. Acute on chronic systolic CHF: Cath in 6295 showed nonobstructive CAD in setting of EF 30%.  Echo in 2/21 at the [facility] showed EF 25-30% with bullseye pattern on strain imaging concerning for amyloidosis.  No family history of cardiomyopathy but father had MI at age 72.  Increased exertional symptoms as well as orthopnea for 2-3 weeks, no inciting event. HS-TnI in 20s range with no trend. RHC/LHC showed nonobstructive CAD, good filling pressures after diuresis, and low cardiac index at 1.8.   Cardiac MRI showed LV EF 16%, RVEF 31%, and non-coronary LGE pattern, primarily mid-wall septal involvement that could be consistent with prior myocarditis or  sarcoidosis, less likely cardiac amyloidosis.  He is not volume overloaded on exam.  - Start back on Lasix 20 mg daily.  - Continue Coreg 6.25 mg bid.  - Increase spironolactone to 25 mg daily.  - Continue Entresto 24/26 bid.  - Will get high resolution CT chest to look for any evidence of sarcoidosis as well as ACE level.  Have sent myeloma panel and urine immunofixation, and will get PYP scan for amyloidosis evaluation.   2. Syncope: Multiple NSVT runs on 2 wk Zio patch, up to 9 seconds.  He had a syncope episode 2-3 weeks ago, cough syncope vs arrhythmia.  No further VT overnight on amiodarone.  - Amiodarone stopped by EP.  - In light of cardiac MRI, will ask Dr. Caryl Comes to see Mr Montoya this morning.  ?Home with Lifevest and repeat echo in 3 months (but EF has been low since 2017) versus ICD implant this admission.  3. 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  He is on Lovenox for now, will start back at discharge.   Length of Stay: 2  Loralie Champagne, MD  12/24/2019, 11:24 AM  Advanced Heart Failure Team Pager [phone] (M-F; 7a - 4p)  Please contact [facility] for night-coverage after hours (4p -7a ) and weekends on [email or web address]

## 2019-12-24 NOTE — Progress Notes (Signed)
Patient Name: Brandon Frye      SUBJECTIVE: Asked by Dr. DM to review history and reconsider EP recommendations.  Patient had a syncopal episode.  It was abrupt in onset preceded by coughing for only about 1-2 seconds.  Abrupt in onset.  Over the last 3 weeks, he has had the abrupt onset and accelerated dyspnea on exertion, orthopnea nocturnal dyspnea.  This is been improved with diuretics but persist to some degree. He was Noted on his outpatient monitor has significant ventricular tachycardia burden.  No prior syncope.  Known cardiomyopathy dating back about 5 years.  Biventricular failure, left greater than right.  Abnormal cMRI with pattern suggestive of myocarditis, sarcoid or less likely amyloid.  Past Medical History:  Diagnosis Date  . Arthritis   . Chronic systolic CHF (congestive heart failure) ([place])   . 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 aspirin EC  81 mg Oral Daily  . carvedilol  6.25 mg Oral BID WC  . digoxin  0.125 mg Oral Daily  . enoxaparin (LOVENOX) injection  40 mg Subcutaneous Q24H  . furosemide  20 mg Oral Daily  . guaiFENesin  1,200 mg Oral BID  . predniSONE  4 mg Oral Q breakfast  . sacubitril-valsartan  1 tablet Oral BID  . sodium chloride flush  3 mL Intravenous Once  . sodium chloride flush  3 mL Intravenous Q12H  . sodium chloride flush  3 mL Intravenous Q12H  . sodium chloride flush  3 mL Intravenous Q12H  . [START ON 12/25/2019] spironolactone  25 mg Oral Daily   Continuous Infusions: . sodium chloride    . sodium chloride Stopped (12/23/19 1918)   sodium chloride, sodium chloride, acetaminophen, acetaminophen, ipratropium-albuterol, ondansetron (ZOFRAN) IV, ondansetron (ZOFRAN) IV, sodium chloride flush, sodium chloride flush    PHYSICAL EXAM Vitals:   12/24/19 0748 12/24/19 1122 12/24/19 1407 12/24/19 1420  BP: 116/80 94/75 93/70    Pulse: 84 77 69    Resp: 20 20    Temp: 98.7 F (37.1 C) 98.2 F (36.8 C) 97.8 F (36.6 C)   TempSrc: Oral Oral Oral   SpO2: 94% 94% 96%   Weight:      Height:    6\' 1"  (1.854 m)    Well developed and nourished in no acute distress HENT normal Neck supple with JVP-  flat   Clear Regular rate and rhythm, no murmurs or gallops Abd-soft with active BS No Clubbing cyanosis edema Skin-warm and dry A & Oriented  Grossly normal sensory and motor function     TELEMETRY: Reviewed personnally pt in nsr :  ECG personally reviewed from 2/17, sinus with normal intervals   Intake/Output Summary (Last 24 hours) at 12/24/2019 1505 Last data filed at 12/24/2019 1300 Gross per 24 hour  Intake 859.31 ml  Output 900 ml  Net -40.69 ml    LABS: Basic Metabolic Panel: Recent Labs  Lab 12/21/19 1834 12/21/19 1834 12/22/19 0909 12/22/19 0909 12/23/19 0501 12/23/19 0920 12/24/19 0516  NA 140  --  139  --  138 137 135  K 3.7  --  3.3*  --  5.2* 4.5 4.4  CL 104  --  99  --  99  --  101  CO2 26  --  31  --  25  --  25  GLUCOSE 104*  --  122*  --  120*  --  113*  BUN 10  --  11  --  13  --  16  CREATININE 1.03  --  1.05  --  1.10  --  1.23  CALCIUM 9.2   < > 8.9   < > 9.2  --  9.0  MG  --   --  1.5*   < > 1.8  --  2.0   < > = values in this interval not displayed.   Cardiac Enzymes: No results for input(s): CKTOTAL, CKMB, CKMBINDEX, TROPONINI in the last 72 hours. CBC: Recent Labs  Lab 12/21/19 1834 12/22/19 0909 12/23/19 0501 12/23/19 0920 12/24/19 0516  WBC 6.1 4.7 4.7  --  5.3  NEUTROABS  --  2.6  --   --   --   HGB 16.6 16.4 17.0 18.4* 17.3*  HCT 49.5 49.3 51.7 54.0* 50.9  MCV 90.0 90.8 90.7  --  88.4  PLT 194 182 172  --  194   PROTIME: No results for input(s): LABPROT, INR in the last 72 hours. Liver Function Tests: Recent Labs    12/22/19 0305  AST 32  ALT 28  ALKPHOS 62  BILITOT 1.2  PROT 6.6  ALBUMIN 3.1*   No results for input(s): LIPASE, AMYLASE in the last 72  hours. BNP: BNP (last 3 results) Recent Labs    12/22/19 0127  BNP 992.9*    ProBNP (last 3 results) No results for input(s): PROBNP in the last 8760 hours.      ASSESSMENT AND PLAN:  Principal Problem:   Acute on chronic congestive heart failure (HCC) Active Problems:   Cardiomyopathy (HCC)   COPD (chronic obstructive pulmonary disease) (HCC)   Hypertensive urgency   Lung nodule   Arthritis   Heart failure (HCC)   Syncope   NSVT (nonsustained ventricular tachycardia) (HCC)  Patient had an episode of syncope in the context of severe chronic left ventricular dysfunction.  Syncopal episode was briefly associated with cough and then abrupt.  This is not my impression of cough syncope; this mechanism is typically associated with protracted coughing resulting in decreased venous return and so is normally, at least in my experience, not as abrupt.  This suggests that the cough may have been related to ventricular arrhythmia; I think that the syncope is indeed arrhythmic, given its abruptness brevity and lack of residual orthostatic intolerance.  Unexplained and is what happened in the last 3 weeks that caused him to acutely deteriorate functionally both with exertion and at rest.  No evidence of coronary disease to explain this.  May well have been ventricular arrhythmia.  Hence, would favor antiarrhythmic suppression and agree with the idea of amiodarone although ongoing use of beta-blockade initially would be my preference and if there is recurrence of VT to consider amiodarone at that time.  Somewhat differently from my colleague, I am inclined towards ICD implantation for "secondary "prevention for this episode of abrupt onset probably arrhythmic syncope in the context of his longstanding, although worse now, cardiomyopathy  Ongoing medical therapy for his cardiomyopathy is indicated.  The issue of LifeVest has come up in the management of his care.  Had his ejection fraction not  been so low in 2016 I might be in favor of using LifeVest to see whether his heart muscle recovered although the cMRI findings are not encouraging that even were there to be normalization of heart muscle function that there would be significant residual substrate abnormalities.  I reviewed this with him with Dr. Shirlee Latch.  Signed, Sherryl Manges MD  12/24/2019

## 2019-12-25 ENCOUNTER — Inpatient Hospital Stay (HOSPITAL_COMMUNITY): Payer: No Typology Code available for payment source

## 2019-12-25 DIAGNOSIS — I5043 Acute on chronic combined systolic (congestive) and diastolic (congestive) heart failure: Secondary | ICD-10-CM

## 2019-12-25 DIAGNOSIS — I429 Cardiomyopathy, unspecified: Secondary | ICD-10-CM

## 2019-12-25 LAB — CBC
HCT: 50.9 % (ref 39.0–52.0)
Hemoglobin: 16.9 g/dL (ref 13.0–17.0)
MCH: 29.8 pg (ref 26.0–34.0)
MCHC: 33.2 g/dL (ref 30.0–36.0)
MCV: 89.6 fL (ref 80.0–100.0)
Platelets: 206 10*3/uL (ref 150–400)
RBC: 5.68 MIL/uL (ref 4.22–5.81)
RDW: 13 % (ref 11.5–15.5)
WBC: 5.2 10*3/uL (ref 4.0–10.5)
nRBC: 0 % (ref 0.0–0.2)

## 2019-12-25 LAB — BASIC METABOLIC PANEL
Anion gap: 8 (ref 5–15)
BUN: 17 mg/dL (ref 8–23)
CO2: 27 mmol/L (ref 22–32)
Calcium: 9.1 mg/dL (ref 8.9–10.3)
Chloride: 100 mmol/L (ref 98–111)
Creatinine, Ser: 1.2 mg/dL (ref 0.61–1.24)
GFR calc Af Amer: >60 mL/min (ref >60)
GFR calc non Af Amer: >60 mL/min (ref >60)
Glucose, Bld: 116 mg/dL — ABNORMAL HIGH (ref 70–99)
Potassium: 4.4 mmol/L (ref 3.5–5.1)
Sodium: 135 mmol/L (ref 135–145)

## 2019-12-25 MED ORDER — SODIUM CHLORIDE 0.9 % IV SOLN
80.0000 mg | INTRAVENOUS | Status: DC
  Filled 2019-12-25: qty 2

## 2019-12-25 MED ORDER — TECHNETIUM TC 99M PYROPHOSPHATE
21.4000 | Freq: Once | INTRAVENOUS | Status: AC | PRN
  Administered 2019-12-25: 21.4 via INTRAVENOUS
  Filled 2019-12-25: qty 22

## 2019-12-25 MED ORDER — CEFAZOLIN SODIUM-DEXTROSE 2-4 GM/100ML-% IV SOLN
2.0000 g | INTRAVENOUS | Status: AC
  Administered 2019-12-26: 2 g via INTRAVENOUS
  Filled 2019-12-25: qty 100

## 2019-12-25 MED ORDER — SODIUM CHLORIDE 0.9 % IV SOLN: INTRAVENOUS | Status: DC

## 2019-12-25 MED ORDER — CARVEDILOL 6.25 MG PO TABS
9.3750 mg | ORAL_TABLET | Freq: Two times a day (BID) | ORAL | Status: DC
  Administered 2019-12-25 – 2019-12-26 (×2): 9.375 mg via ORAL
  Filled 2019-12-25 (×4): qty 1

## 2019-12-25 NOTE — Progress Notes (Signed)
Progress Note    Brandon Frye  UYE:[id number] DOB: 14-Apr-1946  DOA: 12/21/2019 PCP: [facility]    Brief Narrative:    Medical records reviewed and are as summarized below:  Brandon Frye is a very pleasant 74 y.o. male veteran who served in [place] presented 2/18 after being instructed by cardiology at [facility] to go to ED as result of echo results that are urgent. Hx cardiomyopathy. He had been experiencing worsening sob and orthopnea. He denies CP but endorses worsening DOE and cough with clear sputum production.  Admitted for further work-up for systolic heart failure.  Cardiology on board.  Assessment/Plan:   Principal Problem:   Acute on chronic congestive heart failure (HCC) Active Problems:   Cardiomyopathy ([place])   COPD (chronic obstructive pulmonary disease) (HCC)   Hypertensive urgency   Lung nodule   Arthritis   Heart failure (HCC)   Syncope   NSVT (nonsustained ventricular tachycardia) (HCC)   Acute on chronic combined systolic and diastolic CHF (congestive heart failure) ([place])  Acute on chronic decompensated systolic heart failure  Syncope with multiple runs of NSVT Echo in 2/21 at the [facility] showed EF 25 to 30% with bull's-eye pattern on strain imaging Coronary angiogram and 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 HRCT negative for signs of sarcoidosis, ACE levels normal  Heart failure, EP on board    Multiple myeloma panel, urine immunofixation ordered for further work-up by heart failure team, pending. Continue Coreg, spironolactone, Entresto, Lasix, digoxin per cardiology Plan for ICD tomorrow   Hypertensive urgency Improved with diuresis  COPD He stopped smoking 1 month ago.  Is not on home oxygen.  Oxygen saturation level greater than 90% on room air.  No  wheezing on exam.  CTA reveals severe bronchial thickening which leads to bronchial occlusion and peripheral branches no evidence of pneumonia -Continue home Combivent -Monitor oxygen saturation level -Supplemental oxygen as indicated  Lung nodule Incidental finding on CTA in the emergency department.  Report says unchanged 7 mm subpleural left upper lobe pulmonary nodule from prior exam.  Recommend noncontrast chest CT 6 to 12 months.  Family Communication/Anticipated D/C date and plan/Code Status   DVT prophylaxis: Lovenox, prophylactic dose Code Status: Full Code.  Family Communication: No family at bedside Disposition Plan: Plan for discharge home once cleared by cardiology   Medical Consultants:    cardiology   Anti-Infectives:    None  Subjective:   Feels much better.  No new complaints.   Objective:    Vitals:   12/24/19 2015 12/25/19 0413 12/25/19 0741 12/25/19 1141  BP: 109/74 105/76 111/80 105/75  Pulse: 68 70 77 67  Resp: 18 18 20  (!) 21  Temp: 98.5 F (36.9 C) 98.1 F (36.7 C) 98.2 F (36.8 C) 98.1 F (36.7 C)  TempSrc: Oral Oral Oral Oral  SpO2: 95% 97% 94% 95%  Weight:  79.6 kg    Height:        Intake/Output Summary (Last 24 hours) at 12/25/2019 1541 Last data filed at 12/25/2019 0830 Gross per 24 hour  Intake 480 ml  Output 500 ml  Net -20 ml   Filed Weights   12/23/19 0358 12/24/19 0428 12/25/19 0413  Weight: 79.1 kg 79.2 kg 79.6 kg    Exam: General: Awake alert well-nourished in no acute distress CV regular rate  and rhythm, no lower extremity edema Respiratory: No increased work of breathing breath sounds.  No wheeze no rhonchi or crackles. Abdomen: Nondistended soft positive bowel sounds throughout nontender to palpation no guarding or rebounding Musculoskeletal: Joints without swelling/erythema full range of motion Neuro: Alert and oriented x3 speech clear facial symmetry  Data Reviewed:   I have personally reviewed following  labs and imaging studies:  Labs: Labs show the following:   Basic Metabolic Panel: Recent Labs  Lab 12/21/19 1834 12/21/19 1834 12/22/19 0909 12/22/19 0909 12/23/19 0501 12/23/19 0501 12/23/19 0920 12/23/19 0920 12/24/19 0516 12/25/19 0405  NA 140   < > 139  --  138  --  137  --  135 135  K 3.7   < > 3.3*   < > 5.2*   < > 4.5   < > 4.4 4.4  CL 104  --  99  --  99  --   --   --  101 100  CO2 26  --  31  --  25  --   --   --  25 27  GLUCOSE 104*  --  122*  --  120*  --   --   --  113* 116*  BUN 10  --  11  --  13  --   --   --  16 17  CREATININE 1.03  --  1.05  --  1.10  --   --   --  1.23 1.20  CALCIUM 9.2  --  8.9  --  9.2  --   --   --  9.0 9.1  MG  --   --  1.5*  --  1.8  --   --   --  2.0  --    < > = values in this interval not displayed.   GFR Estimated Creatinine Clearance: 63.6 mL/min (by C-G formula based on SCr of 1.2 mg/dL). Liver Function Tests: Recent Labs  Lab 12/22/19 0305  AST 32  ALT 28  ALKPHOS 62  BILITOT 1.2  PROT 6.6  ALBUMIN 3.1*   No results for input(s): LIPASE, AMYLASE in the last 168 hours. No results for input(s): AMMONIA in the last 168 hours. Coagulation profile No results for input(s): INR, PROTIME in the last 168 hours.  CBC: Recent Labs  Lab 12/21/19 1834 12/21/19 1834 12/22/19 0909 12/23/19 0501 12/23/19 0920 12/24/19 0516 12/25/19 0405  WBC 6.1  --  4.7 4.7  --  5.3 5.2  NEUTROABS  --   --  2.6  --   --   --   --   HGB 16.6   < > 16.4 17.0 18.4* 17.3* 16.9  HCT 49.5   < > 49.3 51.7 54.0* 50.9 50.9  MCV 90.0  --  90.8 90.7  --  88.4 89.6  PLT 194  --  182 172  --  194 206   < > = values in this interval not displayed.   Cardiac Enzymes: No results for input(s): CKTOTAL, CKMB, CKMBINDEX, TROPONINI in the last 168 hours. BNP (last 3 results) No results for input(s): PROBNP in the last 8760 hours. CBG: No results for input(s): GLUCAP in the last 168 hours. D-Dimer: No results for input(s): DDIMER in the last 72  hours. Hgb A1c: No results for input(s): HGBA1C in the last 72 hours. Lipid Profile: No results for input(s): CHOL, HDL, LDLCALC, TRIG, CHOLHDL, LDLDIRECT in the last 72 hours. Thyroid function studies: No results for  input(s): TSH, T4TOTAL, T3FREE, THYROIDAB in the last 72 hours.  Invalid input(s): FREET3 Anemia work up: No results for input(s): VITAMINB12, FOLATE, FERRITIN, TIBC, IRON, RETICCTPCT in the last 72 hours. Sepsis Labs: Recent Labs  Lab 12/22/19 0909 12/23/19 0501 12/24/19 0516 12/25/19 0405  WBC 4.7 4.7 5.3 5.2    Microbiology Recent Results (from the past 240 hour(s))  SARS CORONAVIRUS 2 (TAT 6-24 HRS) Nasopharyngeal Nasopharyngeal Swab     Status: None   Collection Time: 12/22/19  3:30 AM   Specimen: Nasopharyngeal Swab  Result Value Ref Range Status   SARS Coronavirus 2 NEGATIVE NEGATIVE Final    Comment: (NOTE) SARS-CoV-2 target nucleic acids are NOT DETECTED. The SARS-CoV-2 RNA is generally detectable in upper and lower respiratory specimens during the acute phase of infection. Negative results do not preclude SARS-CoV-2 infection, do not rule out co-infections with other pathogens, and should not be used as the sole basis for treatment or other patient management decisions. Negative results must be combined with clinical observations, patient history, and epidemiological information. The expected result is Negative. Fact Sheet for Patients: SugarRoll.be Fact Sheet for Healthcare Providers: https://www.woods-mathews.com/ This test is not yet approved or cleared by the [place] FDA and  has been authorized for detection and/or diagnosis of SARS-CoV-2 by FDA under an Emergency Use Authorization (EUA). This EUA will remain  in effect (meaning this test can be used) for the duration of the COVID-19 declaration under Section 56 4(b)(1) of the Act, 21 U.S.C. section 360bbb-3(b)(1), unless the authorization is  terminated or revoked sooner. 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 Shortness of breath. EXAM: CT CHEST WITHOUT CONTRAST TECHNIQUE: Multidetector CT imaging of the chest was performed following the standard protocol without intravenous contrast. High resolution imaging of the lungs, as well as inspiratory and expiratory imaging, was performed. COMPARISON:  Chest CT 12/22/2019. FINDINGS: Cardiovascular: Heart size is normal. There is no significant pericardial fluid, thickening or pericardial calcification. There is aortic atherosclerosis, as well as atherosclerosis of the great vessels of the mediastinum and the coronary arteries, including calcified atherosclerotic plaque in the left main, left anterior descending, left circumflex and right coronary arteries. Mild calcifications of the aortic valve. Mediastinum/Nodes: No pathologically enlarged mediastinal or hilar lymph nodes. Please note that accurate exclusion of hilar adenopathy is limited on noncontrast CT scans. Esophagus is unremarkable in appearance. No axillary lymphadenopathy. Lungs/Pleura: 5 mm subpleural nodule in the periphery of the left upper lobe (axial image 46 of series 10), nonspecific, but slightly decreased in size compared to the prior study, likely a benign subpleural lymph node. No other suspicious appearing pulmonary nodules or masses are noted. Areas of linear scarring are noted in left upper and left lower lobes. No acute consolidative airspace disease. No pleural effusions. Diffuse bronchial wall thickening. High-resolution images demonstrate no significant regions of ground-glass attenuation, septal thickening, traction bronchiectasis or honeycombing. Upper Abdomen: Aortic atherosclerosis. Musculoskeletal: There are no aggressive appearing  lytic or blastic lesions noted in the visualized portions of the skeleton. IMPRESSION: 1. No definite findings to suggest interstitial lung disease. 2. Diffuse bronchial wall thickening, which could suggest chronic bronchitis. 3. No definite imaging stigmata of sarcoidosis identified on today's examination. 4. Slight decreased size of 5 mm subpleural nodule in the periphery of the left upper lobe, likely a benign  subpleural lymph node. 5. Aortic atherosclerosis, in addition to left main and 3 vessel coronary artery disease. Assessment for potential risk factor modification, dietary therapy or pharmacologic therapy may be warranted, if clinically indicated. 6. There are calcifications of the aortic valve. Echocardiographic correlation for evaluation of potential valvular dysfunction may be warranted if clinically indicated. Aortic Atherosclerosis (ICD10-I70.0). Electronically Signed   By: Vinnie Langton M.D.   On: 12/24/2019 14:49    Medications:   . aspirin EC  81 mg Oral Daily  . carvedilol  9.375 mg Oral BID WC  . digoxin  0.125 mg Oral Daily  . enoxaparin (LOVENOX) injection  40 mg Subcutaneous Q24H  . furosemide  20 mg Oral Daily  . guaiFENesin  1,200 mg Oral BID  . predniSONE  4 mg Oral Q breakfast  . sacubitril-valsartan  1 tablet Oral BID  . sodium chloride flush  3 mL Intravenous Once  . sodium chloride flush  3 mL Intravenous Q12H  . sodium chloride flush  3 mL Intravenous Q12H  . sodium chloride flush  3 mL Intravenous Q12H  . spironolactone  25 mg Oral Daily   Continuous Infusions: . sodium chloride    . sodium chloride Stopped (12/23/19 1918)     LOS: 3 days   Lucky Cowboy MD [facility]   How to contact the [facility] Attending or Consulting provider [place] or covering provider during after hours [place], for this patient?  1. Check the care team in [facility] and look for a) attending/consulting [facility] provider listed and b) the [facility] team listed 2. Log into [email or web address] and use  [place]'s universal password to access. If you do not have the password, please contact the hospital operator. 3. Locate the [facility] provider you are looking for under [facility] and page to a number that you can be directly reached. 4. If you still have difficulty reaching the provider, please page the [facility] (Director on Call) for the Hospitalists listed on amion for assistance.  12/25/2019, 3:41 PM

## 2019-12-25 NOTE — Progress Notes (Addendum)
Contacted Brandon Frye at [facility]. They accepted the referral for [facility], but they won't be able to f/u with pt until 12/27/18. Pt needs order for [facility] and F2[facility]. Will continue to f/u to assist with the D/C plan.

## 2019-12-25 NOTE — Progress Notes (Signed)
Patient ID: Brandon Frye, male   DOB: January 02, 1946, 74 y.o.   MRN: [id number] Pat    Advanced Heart Failure Rounding Note  PCP-Cardiologist: No primary care provider on file.   Subjective:    No VT overnight.  No complaints today, feels good. No dyspnea.   CT chest: signs of chronic bronchitis, no ILD, no signs of sarcoidosis.   Cardiac MRI: 1. Moderately dilated LV with mild LV hypertrophy. EF 16%, diffuse hypokinesis. 2. Normal right ventricular size with moderately decreased systolic function, EF 31%. 3. Non-coronary LGE pattern, primarily mid-wall septal involvement. This could be consistent with prior myocarditis or sarcoidosis, less likely cardiac amyloidosis. ECV percentage is increased in the septum, but does not appear to be in the amyloidosis range (>40%).  RHC/LHC Coronary Findings  Diagnostic Dominance: Right Left Main  No significant coronary disease.  Left Anterior Descending  30% stenosis proximal LAD.  Ramus Intermedius  No significant coronary disease.  Left Circumflex  Luminal irregularities.  Right Coronary Artery  Luminal irregularities.  Intervention  No interventions have been documented. Right Heart  Right Heart Pressures RHC Procedural Findings: Hemodynamics (mmHg) RA mean 1 RV 35/3 PA 36/7, mean 20 PCWP mean 8 LV 85/14 AO 92/66  Oxygen saturations: PA 67% AO 99%  Cardiac Output (Fick) 3.65  Cardiac Index (Fick) 1.8     Objective:   Weight Range: 79.6 kg Body mass index is 23.14 kg/m.   Vital Signs:   Temp:  [97.6 F (36.4 C)-98.5 F (36.9 C)] 98.2 F (36.8 C) (02/21 0741) Pulse Rate:  [68-77] 77 (02/21 0741) Resp:  [18-21] 20 (02/21 0741) BP: (93-111)/(70-80) 111/80 (02/21 0741) SpO2:  [94 %-97 %] 94 % (02/21 0741) Weight:  [79.6 kg] 79.6 kg (02/21 0413) Last BM Date: 12/22/19  Weight change: Filed Weights   12/23/19 0358 12/24/19 0428 12/25/19 0413  Weight: 79.1 kg 79.2 kg 79.6 kg     Intake/Output:   Intake/Output Summary (Last 24 hours) at 12/25/2019 1112 Last data filed at 12/25/2019 0830 Gross per 24 hour  Intake 480 ml  Output 700 ml  Net -220 ml      Physical Exam    General: NAD Neck: No JVD, no thyromegaly or thyroid nodule.  Lungs: Clear to auscultation bilaterally with normal respiratory effort. CV: Nondisplaced PMI.  Heart regular S1/S2, no S3/S4, no murmur.  No peripheral edema.  Abdomen: Soft, nontender, no hepatosplenomegaly, no distention.  Skin: Intact without lesions or rashes.  Neurologic: Alert and oriented x 3.  Psych: Normal affect. Extremities: No clubbing or cyanosis.  HEENT: Normal.    Telemetry   NSR 70s-80s (personally reviewed)  Labs    CBC Recent Labs    12/24/19 0516 12/25/19 0405  WBC 5.3 5.2  HGB 17.3* 16.9  HCT 50.9 50.9  MCV 88.4 89.6  PLT 194 206   Basic Metabolic Panel Recent Labs    40/98/11 0501 12/23/19 0920 12/24/19 0516 12/25/19 0405  NA 138   < > 135 135  K 5.2*   < > 4.4 4.4  CL 99   < > 101 100  CO2 25   < > 25 27  GLUCOSE 120*   < > 113* 116*  BUN 13   < > 16 17  CREATININE 1.10   < > 1.23 1.20  CALCIUM 9.2   < > 9.0 9.1  MG 1.8  --  2.0  --    < > = values in this interval not displayed.   Liver  Function Tests No results for input(s): AST, ALT, ALKPHOS, BILITOT, PROT, ALBUMIN in the last 72 hours. No results for input(s): LIPASE, AMYLASE in the last 72 hours. Cardiac Enzymes No results for input(s): CKTOTAL, CKMB, CKMBINDEX, TROPONINI in the last 72 hours.  BNP: BNP (last 3 results) Recent Labs    12/22/19 0127  BNP 992.9*    ProBNP (last 3 results) No results for input(s): PROBNP in the last 8760 hours.   D-Dimer No results for input(s): DDIMER in the last 72 hours. Hemoglobin A1C No results for input(s): HGBA1C in the last 72 hours. Fasting Lipid Panel No results for input(s): CHOL, HDL, LDLCALC, TRIG, CHOLHDL, LDLDIRECT in the last 72 hours. Thyroid Function  Tests No results for input(s): TSH, T4TOTAL, T3FREE, THYROIDAB in the last 72 hours.  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 Shortness of breath. EXAM: CT CHEST WITHOUT CONTRAST TECHNIQUE: Multidetector CT imaging of the chest was performed following the standard protocol without intravenous contrast. High resolution imaging of the lungs, as well as inspiratory and expiratory imaging, was performed. COMPARISON:  Chest CT 12/22/2019. FINDINGS: Cardiovascular: Heart size is normal. There is no significant pericardial fluid, thickening or pericardial calcification. There is aortic atherosclerosis, as well as atherosclerosis of the great vessels of the mediastinum and the coronary arteries, including calcified atherosclerotic plaque in the left main, left anterior descending, left circumflex and right coronary arteries. Mild calcifications of the aortic valve. Mediastinum/Nodes: No pathologically enlarged mediastinal or hilar lymph nodes. Please note that accurate exclusion of hilar adenopathy is limited on noncontrast CT scans. Esophagus is unremarkable in appearance. No axillary lymphadenopathy. Lungs/Pleura: 5 mm subpleural nodule in the periphery of the left upper lobe (axial image 46 of series 10), nonspecific, but slightly decreased in size compared to the prior study, likely a benign subpleural lymph node. No other suspicious appearing pulmonary nodules or masses are noted. Areas of linear scarring are noted in left upper and left lower lobes. No acute consolidative airspace disease. No pleural effusions. Diffuse bronchial wall thickening. High-resolution images demonstrate no significant regions of ground-glass attenuation, septal thickening, traction bronchiectasis or honeycombing. Upper Abdomen: Aortic atherosclerosis. Musculoskeletal: There are no aggressive appearing  lytic or blastic lesions noted in the visualized portions of the skeleton. IMPRESSION: 1. No definite findings to suggest interstitial lung disease. 2. Diffuse bronchial wall thickening, which could suggest chronic bronchitis. 3. No definite imaging stigmata of sarcoidosis identified on today's examination. 4. Slight decreased size of 5 mm subpleural nodule in the periphery of the left upper lobe, likely a benign subpleural lymph node. 5. Aortic atherosclerosis, in addition to left main and 3 vessel coronary artery disease. Assessment for potential risk factor modification, dietary therapy or pharmacologic therapy may be warranted, if clinically indicated. 6. There are calcifications of the aortic valve. Echocardiographic correlation for evaluation of potential valvular dysfunction may be warranted if clinically indicated. Aortic Atherosclerosis (ICD10-I70.0). Electronically Signed   By: Trudie Reed M.D.   On: 12/24/2019 14:49     Medications:     Scheduled Medications: . aspirin EC  81 mg Oral Daily  . carvedilol  9.375 mg Oral BID WC  . digoxin  0.125 mg Oral Daily  . enoxaparin (LOVENOX) injection  40 mg Subcutaneous Q24H  . furosemide  20 mg Oral Daily  . guaiFENesin  1,200 mg Oral BID  . predniSONE  4 mg  Oral Q breakfast  . sacubitril-valsartan  1 tablet Oral BID  . sodium chloride flush  3 mL Intravenous Once  . sodium chloride flush  3 mL Intravenous Q12H  . sodium chloride flush  3 mL Intravenous Q12H  . sodium chloride flush  3 mL Intravenous Q12H  . spironolactone  25 mg Oral Daily    Infusions: . sodium chloride    . sodium chloride Stopped (12/23/19 1918)    PRN Medications: sodium chloride, sodium chloride, acetaminophen, acetaminophen, ipratropium-albuterol, ondansetron (ZOFRAN) IV, ondansetron (ZOFRAN) IV, sodium chloride flush, sodium chloride flush    Assessment/Plan   1. Acute on chronic systolic CHF: Cath in 1740 showed nonobstructive CAD in setting of EF  30%.  Echo in 2/21 at the [facility] showed EF 25-30% with bullseye pattern on strain imaging concerning for amyloidosis.  No family history of cardiomyopathy but father had MI at age 10.  Increased exertional symptoms as well as orthopnea for 2-3 weeks, no inciting event. HS-TnI in 20s range with no trend. RHC/LHC showed nonobstructive CAD, good filling pressures after diuresis, and low cardiac index at 1.8.   Cardiac MRI showed LV EF 16%, RVEF 31%, and non-coronary LGE pattern, primarily mid-wall septal involvement that could be consistent with prior myocarditis or sarcoidosis, less likely cardiac amyloidosis.  CT chest w/o evidence for sarcoidosis and ACE level normal. He is not volume overloaded on exam.  - Continue Lasix 20 mg daily.  - Increase Coreg to 9.375 mg bid.  - Continue spironolactone 25 mg daily.  - Continue Entresto 24/26 bid.  - Have sent myeloma panel and urine immunofixation, and awaiting result on PYP scan for amyloidosis evaluation.   2. Syncope: Multiple NSVT runs on 2 wk Zio patch, up to 9 seconds.  He had a syncope episode 2-3 weeks ago, cough syncope vs arrhythmia.  No further VT overnight on amiodarone.  - He will stay off amiodarone for now and I will titrate up Coreg.  - Discussed with Dr. Caryl Comes, plan for ICD implantation tomorrow.   3. 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  He is on Lovenox for now, will start back at discharge.   Length of Stay: 3  Loralie Champagne, MD  12/25/2019, 11:12 AM  Advanced Heart Failure Team Pager [phone] (M-F; 7a - 4p)  Please contact [facility] for night-coverage after hours (4p -7a ) and weekends on [email or web address]

## 2019-12-25 NOTE — H&P (View-Only) (Signed)
     Patient Name: Brandon Frye      SUBJECTIVE: Asked yday  by Dr. DM to review history and reconsider EP recommendations.  Patient had a syncopal episode.  It was abrupt in onset preceded by coughing for only about 1-2 seconds.  Abrupt in onset.  Over the last 3 weeks, he has had the abrupt onset and accelerated dyspnea on exertion, orthopnea nocturnal dyspnea.  This is been improved with diuretics but persist to some degree. He was Noted on his outpatient monitor has significant ventricular tachycardia burden.  No prior syncope.  Known cardiomyopathy dating back about 5 years.  Biventricular failure, left greater than right.  Abnormal cMRI with pattern suggestive of myocarditis, sarcoid or less likely amyloid.   The patient denies chest pain, shortness of breath, nocturnal dyspnea, orthopnea or peripheral edema.  There have been no palpitations, lightheadedness or syncope.    Past Medical History:  Diagnosis Date  . Arthritis   . Chronic systolic CHF (congestive heart failure) (HCC)   . Non-ischemic cardiomyopathy (HCC) 2017   clean cath, EF 30%    Scheduled Meds:  Scheduled Meds: . aspirin EC  81 mg Oral Daily  . carvedilol  6.25 mg Oral BID WC  . digoxin  0.125 mg Oral Daily  . enoxaparin (LOVENOX) injection  40 mg Subcutaneous Q24H  . furosemide  20 mg Oral Daily  . guaiFENesin  1,200 mg Oral BID  . predniSONE  4 mg Oral Q breakfast  . sacubitril-valsartan  1 tablet Oral BID  . sodium chloride flush  3 mL Intravenous Once  . sodium chloride flush  3 mL Intravenous Q12H  . sodium chloride flush  3 mL Intravenous Q12H  . sodium chloride flush  3 mL Intravenous Q12H  . spironolactone  25 mg Oral Daily   Continuous Infusions: . sodium chloride    . sodium chloride Stopped (12/23/19 1918)   sodium chloride, sodium chloride, acetaminophen, acetaminophen, ipratropium-albuterol, ondansetron (ZOFRAN) IV, ondansetron (ZOFRAN) IV, sodium chloride flush,  sodium chloride flush    PHYSICAL EXAM Vitals:   12/24/19 1724 12/24/19 2015 12/25/19 0413 12/25/19 0741  BP: 106/72 109/74 105/76 111/80  Pulse: 70 68 70 77  Resp: (!) 21 18 18 20  Temp: 97.6 F (36.4 C) 98.5 F (36.9 C) 98.1 F (36.7 C) 98.2 F (36.8 C)  TempSrc: Oral Oral Oral Oral  SpO2: 94% 95% 97% 94%  Weight:   79.6 kg   Height:        Well developed and nourished in no acute distress HENT normal Neck supple with JVP-  flat   Clear Regular rate and rhythm, no murmurs or gallops Abd-soft with active BS No Clubbing cyanosis edema Skin-warm and dry A & Oriented  Grossly normal sensory and motor function  Telemetry Personally reviewed  Sinus   ECG personally reviewed from 2/17, sinus with normal intervals   Intake/Output Summary (Last 24 hours) at 12/25/2019 0808 Last data filed at 12/25/2019 0400 Gross per 24 hour  Intake 480 ml  Output 1000 ml  Net -520 ml    LABS: Basic Metabolic Panel: Recent Labs  Lab 12/21/19 1834 12/21/19 1834 12/22/19 0909 12/22/19 0909 12/23/19 0501 12/23/19 0501 12/23/19 0920 12/24/19 0516 12/25/19 0405  NA 140  --  139  --  138  --  137 135 135  K 3.7  --  3.3*  --  5.2*  --  4.5 4.4 4.4  CL 104  --  99  --    99  --   --  101 100  CO2 26  --  31  --  25  --   --  25 27  GLUCOSE 104*  --  122*  --  120*  --   --  113* 116*  BUN 10  --  11  --  13  --   --  16 17  CREATININE 1.03  --  1.05  --  1.10  --   --  1.23 1.20  CALCIUM 9.2   < > 8.9   < > 9.2   < >  --  9.0 9.1  MG  --   --  1.5*   < > 1.8  --   --  2.0  --    < > = values in this interval not displayed.   Cardiac Enzymes: No results for input(s): CKTOTAL, CKMB, CKMBINDEX, TROPONINI in the last 72 hours. CBC: Recent Labs  Lab 12/21/19 1834 12/22/19 0909 12/23/19 0501 12/23/19 0920 12/24/19 0516 12/25/19 0405  WBC 6.1 4.7 4.7  --  5.3 5.2  NEUTROABS  --  2.6  --   --   --   --   HGB 16.6 16.4 17.0 18.4* 17.3* 16.9  HCT 49.5 49.3 51.7 54.0* 50.9 50.9   MCV 90.0 90.8 90.7  --  88.4 89.6  PLT 194 182 172  --  194 206   PROTIME: No results for input(s): LABPROT, INR in the last 72 hours. Liver Function Tests: No results for input(s): AST, ALT, ALKPHOS, BILITOT, PROT, ALBUMIN in the last 72 hours. No results for input(s): LIPASE, AMYLASE in the last 72 hours. BNP: BNP (last 3 results) Recent Labs    12/22/19 0127  BNP 992.9*    ProBNP (last 3 results) No results for input(s): PROBNP in the last 8760 hours.      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  I recommended that we proceed with ICD implantation for "secondary "prevention given the syncope.  He agrees.  We have reviewed risks and benefits including but not limited to infection perforation of heart and prolonged as well as inappropriate shocks and death.  He agrees to proceed.  Repetitive monomorphic VT is frequently triggered.  We will anticipate that this represents ischemia  not withstanding the absence of epicardial disease.and/or is consequential to his heart failure  Aggressive anti-ischemic therapy  Signed, Joyce Heitman MD  12/25/2019  

## 2019-12-25 NOTE — Progress Notes (Signed)
Patient Name: Brandon Frye      SUBJECTIVE: Asked yday  by Dr. DM to review history and reconsider EP recommendations.  Patient had a syncopal episode.  It was abrupt in onset preceded by coughing for only about 1-2 seconds.  Abrupt in onset.  Over the last 3 weeks, he has had the abrupt onset and accelerated dyspnea on exertion, orthopnea nocturnal dyspnea.  This is been improved with diuretics but persist to some degree. He was Noted on his outpatient monitor has significant ventricular tachycardia burden.  No prior syncope.  Known cardiomyopathy dating back about 5 years.  Biventricular failure, left greater than right.  Abnormal cMRI with pattern suggestive of myocarditis, sarcoid or less likely amyloid.   The patient denies chest pain, shortness of breath, nocturnal dyspnea, orthopnea or peripheral edema.  There have been no palpitations, lightheadedness or syncope.    Past Medical History:  Diagnosis Date  . Arthritis   . Chronic systolic CHF (congestive heart failure) ([place])   . Non-ischemic cardiomyopathy ([place]) 2017   clean cath, EF 30%    Scheduled Meds:  Scheduled Meds: . aspirin EC  81 mg Oral Daily  . carvedilol  6.25 mg Oral BID WC  . digoxin  0.125 mg Oral Daily  . enoxaparin (LOVENOX) injection  40 mg Subcutaneous Q24H  . furosemide  20 mg Oral Daily  . guaiFENesin  1,200 mg Oral BID  . predniSONE  4 mg Oral Q breakfast  . sacubitril-valsartan  1 tablet Oral BID  . sodium chloride flush  3 mL Intravenous Once  . sodium chloride flush  3 mL Intravenous Q12H  . sodium chloride flush  3 mL Intravenous Q12H  . sodium chloride flush  3 mL Intravenous Q12H  . spironolactone  25 mg Oral Daily   Continuous Infusions: . sodium chloride    . sodium chloride Stopped (12/23/19 1918)   sodium chloride, sodium chloride, acetaminophen, acetaminophen, ipratropium-albuterol, ondansetron (ZOFRAN) IV, ondansetron (ZOFRAN) IV, sodium chloride flush,  sodium chloride flush    PHYSICAL EXAM Vitals:   12/24/19 1724 12/24/19 2015 12/25/19 0413 12/25/19 0741  BP: 106/72 109/74 105/76 111/80  Pulse: 70 68 70 77  Resp: (!) 21 18 18 20   Temp: 97.6 F (36.4 C) 98.5 F (36.9 C) 98.1 F (36.7 C) 98.2 F (36.8 C)  TempSrc: Oral Oral Oral Oral  SpO2: 94% 95% 97% 94%  Weight:   79.6 kg   Height:        Well developed and nourished in no acute distress HENT normal Neck supple with JVP-  flat   Clear Regular rate and rhythm, no murmurs or gallops Abd-soft with active BS No Clubbing cyanosis edema Skin-warm and dry A & Oriented  Grossly normal sensory and motor function  Telemetry Personally reviewed  Sinus   ECG personally reviewed from 2/17, sinus with normal intervals   Intake/Output Summary (Last 24 hours) at 12/25/2019 0808 Last data filed at 12/25/2019 0400 Gross per 24 hour  Intake 480 ml  Output 1000 ml  Net -520 ml    LABS: Basic Metabolic Panel: Recent Labs  Lab 12/21/19 1834 12/21/19 1834 12/22/19 0909 12/22/19 0909 12/23/19 0501 12/23/19 0501 12/23/19 0920 12/24/19 0516 12/25/19 0405  NA 140  --  139  --  138  --  137 135 135  K 3.7  --  3.3*  --  5.2*  --  4.5 4.4 4.4  CL 104  --  99  --  99  --   --  101 100  CO2 26  --  31  --  25  --   --  25 27  GLUCOSE 104*  --  122*  --  120*  --   --  113* 116*  BUN 10  --  11  --  13  --   --  16 17  CREATININE 1.03  --  1.05  --  1.10  --   --  1.23 1.20  CALCIUM 9.2   < > 8.9   < > 9.2   < >  --  9.0 9.1  MG  --   --  1.5*   < > 1.8  --   --  2.0  --    < > = values in this interval not displayed.   Cardiac Enzymes: No results for input(s): CKTOTAL, CKMB, CKMBINDEX, TROPONINI in the last 72 hours. CBC: Recent Labs  Lab 12/21/19 1834 12/22/19 0909 12/23/19 0501 12/23/19 0920 12/24/19 0516 12/25/19 0405  WBC 6.1 4.7 4.7  --  5.3 5.2  NEUTROABS  --  2.6  --   --   --   --   HGB 16.6 16.4 17.0 18.4* 17.3* 16.9  HCT 49.5 49.3 51.7 54.0* 50.9 50.9   MCV 90.0 90.8 90.7  --  88.4 89.6  PLT 194 182 172  --  194 206   PROTIME: No results for input(s): LABPROT, INR in the last 72 hours. Liver Function Tests: No results for input(s): AST, ALT, ALKPHOS, BILITOT, PROT, ALBUMIN in the last 72 hours. No results for input(s): LIPASE, AMYLASE in the last 72 hours. BNP: BNP (last 3 results) Recent Labs    12/22/19 0127  BNP 992.9*    ProBNP (last 3 results) No results for input(s): PROBNP in the last 8760 hours.      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  I recommended that we proceed with ICD implantation for "secondary "prevention given the syncope.  He agrees.  We have reviewed risks and benefits including but not limited to infection perforation of heart and prolonged as well as inappropriate shocks and death.  He agrees to proceed.  Repetitive monomorphic VT is frequently triggered.  We will anticipate that this represents ischemia  not withstanding the absence of epicardial disease.and/or is consequential to his heart failure  Aggressive anti-ischemic therapy  Signed, Sherryl Manges MD  12/25/2019

## 2019-12-25 NOTE — Plan of Care (Signed)
  Problem: Education: Goal: Ability to demonstrate management of disease process will improve Outcome: Progressing   Problem: Activity: Goal: Capacity to carry out activities will improve Outcome: Progressing   

## 2019-12-26 ENCOUNTER — Inpatient Hospital Stay (HOSPITAL_COMMUNITY)
Admission: EM | Disposition: A | Discharge status: Home / Self Care | Payer: Self-pay | Source: Ambulatory Visit | Attending: Internal Medicine

## 2019-12-26 DIAGNOSIS — I472 Ventricular tachycardia: Secondary | ICD-10-CM

## 2019-12-26 HISTORY — PX: ICD IMPLANT: EP1208

## 2019-12-26 LAB — BASIC METABOLIC PANEL
Anion gap: 9 (ref 5–15)
BUN: 17 mg/dL (ref 8–23)
CO2: 24 mmol/L (ref 22–32)
Calcium: 9 mg/dL (ref 8.9–10.3)
Chloride: 100 mmol/L (ref 98–111)
Creatinine, Ser: 1.41 mg/dL — ABNORMAL HIGH (ref 0.61–1.24)
GFR calc Af Amer: 58 mL/min — ABNORMAL LOW (ref >60)
GFR calc non Af Amer: 50 mL/min — ABNORMAL LOW (ref >60)
Glucose, Bld: 103 mg/dL — ABNORMAL HIGH (ref 70–99)
Potassium: 4.3 mmol/L (ref 3.5–5.1)
Sodium: 133 mmol/L — ABNORMAL LOW (ref 135–145)

## 2019-12-26 LAB — CBC
HCT: 48.5 % (ref 39.0–52.0)
Hemoglobin: 16.3 g/dL (ref 13.0–17.0)
MCH: 30.1 pg (ref 26.0–34.0)
MCHC: 33.6 g/dL (ref 30.0–36.0)
MCV: 89.6 fL (ref 80.0–100.0)
Platelets: 216 10*3/uL (ref 150–400)
RBC: 5.41 MIL/uL (ref 4.22–5.81)
RDW: 13 % (ref 11.5–15.5)
WBC: 4.8 10*3/uL (ref 4.0–10.5)
nRBC: 0 % (ref 0.0–0.2)

## 2019-12-26 LAB — SURGICAL PCR SCREEN
MRSA, PCR: NEGATIVE
Staphylococcus aureus: NEGATIVE

## 2019-12-26 SURGERY — ICD IMPLANT
Anesthesia: LOCAL

## 2019-12-26 MED ORDER — MIDAZOLAM HCL 5 MG/5ML IJ SOLN
INTRAMUSCULAR | Status: DC | PRN
  Administered 2019-12-26: 1 mg via INTRAVENOUS
  Administered 2019-12-26: 2 mg via INTRAVENOUS

## 2019-12-26 MED ORDER — FENTANYL CITRATE (PF) 100 MCG/2ML IJ SOLN
INTRAMUSCULAR | Status: AC
  Filled 2019-12-26: qty 2

## 2019-12-26 MED ORDER — HEPARIN (PORCINE) IN NACL 1000-0.9 UT/500ML-% IV SOLN
INTRAVENOUS | Status: AC
  Filled 2019-12-26: qty 500

## 2019-12-26 MED ORDER — FENTANYL CITRATE (PF) 100 MCG/2ML IJ SOLN
INTRAMUSCULAR | Status: DC | PRN
  Administered 2019-12-26: 50 ug via INTRAVENOUS
  Administered 2019-12-26: 25 ug via INTRAVENOUS

## 2019-12-26 MED ORDER — MIDAZOLAM HCL 5 MG/5ML IJ SOLN
INTRAMUSCULAR | Status: AC
  Filled 2019-12-26: qty 5

## 2019-12-26 MED ORDER — LIDOCAINE HCL 1 % IJ SOLN
INTRAMUSCULAR | Status: AC
  Filled 2019-12-26: qty 20

## 2019-12-26 MED ORDER — ONDANSETRON HCL 4 MG/2ML IJ SOLN: 4.0000 mg | Freq: Four times a day (QID) | INTRAMUSCULAR | Status: DC | PRN

## 2019-12-26 MED ORDER — CEFAZOLIN SODIUM-DEXTROSE 1-4 GM/50ML-% IV SOLN
1.0000 g | Freq: Four times a day (QID) | INTRAVENOUS | Status: AC
  Administered 2019-12-26 – 2019-12-27 (×3): 1 g via INTRAVENOUS
  Filled 2019-12-26 (×3): qty 50

## 2019-12-26 MED ORDER — CEFAZOLIN SODIUM-DEXTROSE 2-4 GM/100ML-% IV SOLN
INTRAVENOUS | Status: AC
  Filled 2019-12-26: qty 100

## 2019-12-26 MED ORDER — SODIUM CHLORIDE 0.9 % IV SOLN: INTRAVENOUS | Status: DC | PRN

## 2019-12-26 MED ORDER — OXYCODONE HCL 5 MG PO TABS
2.5000 mg | ORAL_TABLET | Freq: Three times a day (TID) | ORAL | Status: DC | PRN
  Administered 2019-12-26 – 2019-12-27 (×2): 2.5 mg via ORAL
  Filled 2019-12-26 (×3): qty 1

## 2019-12-26 MED ORDER — ACETAMINOPHEN 500 MG PO TABS
1000.0000 mg | ORAL_TABLET | Freq: Four times a day (QID) | ORAL | Status: DC | PRN
  Administered 2019-12-26 – 2019-12-27 (×3): 1000 mg via ORAL
  Filled 2019-12-26 (×3): qty 2

## 2019-12-26 MED ORDER — HEPARIN (PORCINE) IN NACL 1000-0.9 UT/500ML-% IV SOLN
INTRAVENOUS | Status: DC | PRN
  Administered 2019-12-26: 500 mL

## 2019-12-26 MED ORDER — ACETAMINOPHEN 325 MG PO TABS: 325.0000 mg | ORAL_TABLET | ORAL | Status: DC | PRN

## 2019-12-26 MED ORDER — LIDOCAINE HCL 1 % IJ SOLN
INTRAMUSCULAR | Status: AC
  Filled 2019-12-26: qty 40

## 2019-12-26 MED ORDER — LIDOCAINE HCL (PF) 1 % IJ SOLN
INTRAMUSCULAR | Status: DC | PRN
  Administered 2019-12-26: 60 mL via SUBCUTANEOUS

## 2019-12-26 MED ORDER — SODIUM CHLORIDE 0.9 % IV SOLN
INTRAVENOUS | Status: AC
  Filled 2019-12-26: qty 2

## 2019-12-26 SURGICAL SUPPLY — 8 items
CABLE SURGICAL S-101-97-12 (CABLE) ×2 IMPLANT
HEMOSTAT SURGICEL 2X4 FIBR (HEMOSTASIS) ×1 IMPLANT
ICD VISIA MRI VR DVFB1D4 (ICD Generator) IMPLANT
LEAD SPRINT QUAT SEC 6935M-62 (Lead) ×1 IMPLANT
PAD PRO RADIOLUCENT 2001M-C (PAD) ×2 IMPLANT
SHEATH 9FR PRELUDE SNAP 13 (SHEATH) ×1 IMPLANT
TRAY PACEMAKER INSERTION (PACKS) ×2 IMPLANT
VISIA MRI VR DVFB1D4 (ICD Generator) ×2 IMPLANT

## 2019-12-26 NOTE — Progress Notes (Signed)
Pt arrived back to the unit. ICD Lt side CDI. Breakfast ordered.

## 2019-12-26 NOTE — TOC Progression Note (Addendum)
Transition of Care [facility]) - Progression Note    Patient Details  Name: Brandon Frye MRN: [id number] Date of Birth: 05-28-1946  Transition of Care [facility]) CM/SW Contact  Leone Haven, RN Phone Number: 12/26/2019, 2:41 PM  Clinical Narrative:     Dr. Cain Saupe is PCP at [facility], fax number is [phone].  CSW is [employer] pager [phone], phone [phone] ext [phone].  NCM faxed the [facility] order and h/p and face sheet to Dr. Joseph Art office. NCM left Marquita a vm for return call to assist in getting [facility] for the patient.  NCM made referral to [facility] with [facility], she states she can take the referral and once receive the authorization from the [place] fax to [phone].   2/23- NCM contacted Marquita at the [facility] to see if she has authorization fro [facility] yet for [facility].  Awaiting call back.  Received call back from [facility] stating to call [phone] to get the authorization.  Elnita Maxwell with [facility] states she did receive the authorization from the [place] and soc will be tomorrow.         Expected Discharge Plan and Services                                                 Social Determinants of Health (SDOH) Interventions    Readmission Risk Interventions No flowsheet data found.

## 2019-12-26 NOTE — Progress Notes (Signed)
Patient ID: Brandon Frye, male   DOB: Jul 25, 1946, 74 y.o.   MRN: [id number] Pat    Advanced Heart Failure Rounding Note  PCP-Cardiologist: No primary care provider on file.   Subjective:    No VT overnight.  No complaints today, feels good. No dyspnea.   CT chest: signs of chronic bronchitis, no ILD, no signs of sarcoidosis.   Cardiac MRI: 1. Moderately dilated LV with mild LV hypertrophy. EF 16%, diffuse hypokinesis. 2. Normal right ventricular size with moderately decreased systolic function, EF 40%. 3. Non-coronary LGE pattern, primarily mid-wall septal involvement. This could be consistent with prior myocarditis or sarcoidosis, less likely cardiac amyloidosis. ECV percentage is increased in the septum, but does not appear to be in the amyloidosis range (>40%).  RHC/LHC Coronary Findings  Diagnostic Dominance: Right Left Main  No significant coronary disease.  Left Anterior Descending  30% stenosis proximal LAD.  Ramus Intermedius  No significant coronary disease.  Left Circumflex  Luminal irregularities.  Right Coronary Artery  Luminal irregularities.  Intervention  No interventions have been documented. Right Heart  Right Heart Pressures RHC Procedural Findings: Hemodynamics (mmHg) RA mean 1 RV 35/3 PA 36/7, mean 20 PCWP mean 8 LV 85/14 AO 92/66  Oxygen saturations: PA 67% AO 99%  Cardiac Output (Fick) 3.65  Cardiac Index (Fick) 1.8     Objective:   Weight Range: 79.6 kg Body mass index is 23.15 kg/m.   Vital Signs:   Temp:  [97.9 F (36.6 C)-98.2 F (36.8 C)] 97.9 F (36.6 C) (02/22 0453) Pulse Rate:  [63-69] 63 (02/22 0453) Resp:  [16-21] 16 (02/22 0453) BP: (101-115)/(69-80) 115/80 (02/22 0453) SpO2:  [93 %-97 %] 93 % (02/22 0453) Weight:  [79.6 kg] 79.6 kg (02/22 0453) Last BM Date: 12/25/19  Weight change: Filed Weights   12/24/19 0428 12/25/19 0413 12/26/19 0453  Weight: 79.2 kg 79.6 kg 79.6 kg     Intake/Output:   Intake/Output Summary (Last 24 hours) at 12/26/2019 0753 Last data filed at 12/26/2019 0700 Gross per 24 hour  Intake 723.6 ml  Output 600 ml  Net 123.6 ml      Physical Exam    General: NAD Neck: No JVD, no thyromegaly or thyroid nodule.  Lungs: Clear to auscultation bilaterally with normal respiratory effort. CV: Nondisplaced PMI.  Heart regular S1/S2, no S3/S4, no murmur.  No peripheral edema.   Abdomen: Soft, nontender, no hepatosplenomegaly, no distention.  Skin: Intact without lesions or rashes.  Neurologic: Alert and oriented x 3.  Psych: Normal affect. Extremities: No clubbing or cyanosis.  HEENT: Normal.    Telemetry   NSR 70s-80s (personally reviewed)  Labs    CBC Recent Labs    12/25/19 0405 12/26/19 0247  WBC 5.2 4.8  HGB 16.9 16.3  HCT 50.9 48.5  MCV 89.6 89.6  PLT 206 981   Basic Metabolic Panel Recent Labs    12/24/19 0516 12/24/19 0516 12/25/19 0405 12/26/19 0247  NA 135   < > 135 133*  K 4.4   < > 4.4 4.3  CL 101   < > 100 100  CO2 25   < > 27 24  GLUCOSE 113*   < > 116* 103*  BUN 16   < > 17 17  CREATININE 1.23   < > 1.20 1.41*  CALCIUM 9.0   < > 9.1 9.0  MG 2.0  --   --   --    < > = values in this interval not displayed.  Liver Function Tests No results for input(s): AST, ALT, ALKPHOS, BILITOT, PROT, ALBUMIN in the last 72 hours. No results for input(s): LIPASE, AMYLASE in the last 72 hours. Cardiac Enzymes No results for input(s): CKTOTAL, CKMB, CKMBINDEX, TROPONINI in the last 72 hours.  BNP: BNP (last 3 results) Recent Labs    12/22/19 0127  BNP 992.9*    ProBNP (last 3 results) No results for input(s): PROBNP in the last 8760 hours.   D-Dimer No results for input(s): DDIMER in the last 72 hours. Hemoglobin A1C No results for input(s): HGBA1C in the last 72 hours. Fasting Lipid Panel No results for input(s): CHOL, HDL, LDLCALC, TRIG, CHOLHDL, LDLDIRECT in the last 72 hours. Thyroid  Function Tests No results for input(s): TSH, T4TOTAL, T3FREE, THYROIDAB in the last 72 hours.  Invalid input(s): FREET3  Other results:   Imaging    NM CARDIAC AMYLOID TUMOR LOC INFLAM SPECT 1 DAY  Result Date: 12/26/2019 CLINICAL DATA:  HEART FAILURE. CONCERN FOR CARDIAC AMYLOIDOSIS. EXAM: NUCLEAR MEDICINE TUMOR LOCALIZATION. PYP CARDIAC AMYLOIDOSIS SCAN WITH SPECT TECHNIQUE: Following intravenous administration of radiopharmaceutical, anterior planar images of the chest were obtained. Regions of interest were placed on the heart and contralateral chest wall for quantitative assessment. Additional SPECT imaging of the chest was obtained. RADIOPHARMACEUTICALS:  21.4 mCi TECHNETIUM 99 PYROPHOSPHATE FINDINGS: Planar Visual assessment: Anterior planar imaging demonstrates radiotracer uptake within the heart less than uptake within the adjacent ribs (Grade 1). Quantitative assessment : Quantitative assessment of the cardiac uptake compared to the contralateral chest wall is equal to 1.2 (H/CL = 1.2). SPECT assessment: SPECT imaging of the chest demonstrates minimal radiotracer accumulation within the LEFT ventricle. IMPRESSION: Visual and quantitative assessment (grade 1.2, H/CL equal 1) are NOT suggestive of transthyretin amyloidosis. Electronically Signed   By: Genevive Bi M.D.   On: 12/26/2019 07:48     Medications:     Scheduled Medications: . [MAR Hold] aspirin EC  81 mg Oral Daily  . [MAR Hold] carvedilol  9.375 mg Oral BID WC  . [MAR Hold] digoxin  0.125 mg Oral Daily  . [MAR Hold] enoxaparin (LOVENOX) injection  40 mg Subcutaneous Q24H  . [MAR Hold] furosemide  20 mg Oral Daily  . gentamicin irrigation  80 mg Irrigation On Call  . [MAR Hold] guaiFENesin  1,200 mg Oral BID  . [MAR Hold] predniSONE  4 mg Oral Q breakfast  . [MAR Hold] sacubitril-valsartan  1 tablet Oral BID  . [MAR Hold] sodium chloride flush  3 mL Intravenous Once  . [MAR Hold] sodium chloride flush  3 mL  Intravenous Q12H  . [MAR Hold] sodium chloride flush  3 mL Intravenous Q12H  . [MAR Hold] sodium chloride flush  3 mL Intravenous Q12H  . [MAR Hold] spironolactone  25 mg Oral Daily    Infusions: . [MAR Hold] sodium chloride    . [MAR Hold] sodium chloride Stopped (12/23/19 1918)  . sodium chloride    . sodium chloride 50 mL/hr at 12/26/19 0647  .  ceFAZolin (ANCEF) IV 2 g (12/26/19 0747)    PRN Medications: [MAR Hold] sodium chloride, [MAR Hold] sodium chloride, [MAR Hold] acetaminophen, [MAR Hold] acetaminophen, Heparin (Porcine) in NaCl, [MAR Hold] ipratropium-albuterol, [MAR Hold] ondansetron (ZOFRAN) IV, [MAR Hold] ondansetron (ZOFRAN) IV, [MAR Hold] sodium chloride flush, [MAR Hold] sodium chloride flush    Assessment/Plan   1. Acute on chronic systolic CHF: Cath in 2017 showed nonobstructive CAD in setting of EF 30%.  [facility]-30% with bullseye pattern on strain imaging concerning for amyloidosis.  No family history of cardiomyopathy but father had MI at age 43.  Increased exertional symptoms as well as orthopnea for 2-3 weeks, no inciting event. HS-TnI in 20s range with no trend. RHC/LHC showed nonobstructive CAD, good filling pressures after diuresis, and low cardiac index at 1.8.   Cardiac MRI showed LV EF 16%, RVEF 31%, and non-coronary LGE pattern, primarily mid-wall septal involvement that could be consistent with prior myocarditis or sarcoidosis, less likely cardiac amyloidosis.  CT chest w/o evidence for sarcoidosis and ACE level normal. He is not volume overloaded on exam.  - Continue Lasix 20 mg daily.  - Continue Coreg 9.375 mg bid.  - Continue spironolactone 25 mg daily.  - Continue Entresto 24/26 bid.  - Have sent myeloma panel and urine immunofixation, and awaiting result on PYP scan for amyloidosis evaluation (I reviewed images, does not look positive to me).    2. Syncope: Multiple NSVT runs on 2 wk Zio patch, up to 9 seconds.  He had a  syncope episode 2-3 weeks ago, cough syncope vs arrhythmia.  No further VT overnight on amiodarone.  - He will stay off amiodarone for now and I will titrate up Coreg.  - He is getting an ICD today.   3. 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  He is on Lovenox for now, will start back at discharge when device site is stable.   Length of Stay: 4  Marca Ancona, MD  12/26/2019, 7:53 AM  Advanced Heart Failure Team Pager [phone] (M-F; 7a - 4p)  Please contact [facility] for night-coverage after hours (4p -7a ) and weekends on [email or web address]

## 2019-12-26 NOTE — Progress Notes (Signed)
Progress Note    [name]  CHE:[id number] DOB: 08/16/1946  DOA: 12/21/2019 PCP: [facility]    Brief Narrative:    Medical records reviewed and are as summarized below:  Brandon Frye is a very pleasant 74 y.o. male veteran who served in [place] presented 2/18 after being instructed by cardiology at [facility] to go to ED as result of echo results that are urgent. Hx cardiomyopathy. He had been experiencing worsening sob and orthopnea. He denies CP but endorses worsening DOE and cough with clear sputum production.  Admitted for further work-up for systolic heart failure.  Cardiology on board.  Assessment/Plan:   Principal Problem:   Acute on chronic congestive heart failure (HCC) Active Problems:   Cardiomyopathy ([place])   COPD (chronic obstructive pulmonary disease) (HCC)   Hypertensive urgency   Lung nodule   Arthritis   Heart failure (HCC)   Syncope   NSVT (nonsustained ventricular tachycardia) (HCC)   Acute on chronic combined systolic and diastolic CHF (congestive heart failure) ([place])  Acute on chronic decompensated systolic heart failure  Syncope with multiple runs of NSVT Echo in 2/21 at the [facility] showed EF 25 to 30% with bull's-eye pattern on strain imaging Coronary angiogram and 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 HRCT negative for signs of sarcoidosis, ACE levels normal  Heart failure, EP on board    Multiple myeloma panel, urine immunofixation ordered for further work-up by heart failure team, pending. Continue Coreg, spironolactone, Entresto, Lasix, digoxin per cardiology S/p ICD on 12/26/2019 with no immediate complications   Hypertensive urgency Improved with diuresis  Mildly elevated creatinine Mild elevation in creatinine to 1.4 noted from 1.2 Appears  euvolemic on exam Will continue diuresis and repeat BMP tomorrow  COPD He stopped smoking 1 month ago.  Is not on home oxygen.  Oxygen saturation level greater than 90% on room air.  No wheezing on exam.  CTA reveals severe bronchial thickening which leads to bronchial occlusion and peripheral branches no evidence of pneumonia -Continue home Combivent -Monitor oxygen saturation level -Supplemental oxygen as indicated  Lung nodule Incidental finding on CTA in the emergency department.  Report says unchanged 7 mm subpleural left upper lobe pulmonary nodule from prior exam.  Recommend noncontrast chest CT 6 to 12 months.  [facility] 5 mg per patient and is being tapered to discontinue Currently on prednisone 4 mg daily  Family Communication/Anticipated D/C date and plan/Code Status   DVT prophylaxis: Lovenox, prophylactic dose Code Status: Full Code.  Family Communication: No family at bedside Disposition Plan: Plan for discharge home once cleared by cardiology   Medical Consultants:    cardiology   Anti-Infectives:    None  Subjective:   No new complaints.   Objective:    Vitals:   12/26/19 0853 12/26/19 0858 12/26/19 1039 12/26/19 1139  BP: 122/84  112/78 123/84  Pulse: 75 (!) 281 79 76  Resp: 14 (!) 0  18  Temp:    97.9 F (36.6 C)  TempSrc:    Oral  SpO2: 92% (!) 0% 95% 96%  Weight:      Height:        Intake/Output Summary (Last 24 hours) at 12/26/2019 1635 Last data filed at 12/26/2019 1515 Gross per 24 hour  Intake 573.6 ml  Output  1000 ml  Net -426.4 ml   Filed Weights   12/24/19 0428 12/25/19 0413 12/26/19 0453  Weight: 79.2 kg 79.6 kg 79.6 kg    Exam: General: Awake alert well-nourished in no acute distress CV regular rate and rhythm, no lower extremity edema Respiratory: No increased work of breathing breath sounds.  No wheeze no rhonchi or crackles. Abdomen: Nondistended soft positive bowel  sounds throughout nontender to palpation no guarding or rebounding Musculoskeletal: Joints without swelling/erythema full range of motion Neuro: Alert and oriented x3 speech clear facial symmetry  Data Reviewed:   I have personally reviewed following labs and imaging studies:  Labs: Labs show the following:   Basic Metabolic Panel: Recent Labs  Lab 12/22/19 0909 12/22/19 0909 12/23/19 0501 12/23/19 0501 12/23/19 0920 12/23/19 0920 12/24/19 0516 12/24/19 0516 12/25/19 0405 12/26/19 0247  NA 139   < > 138  --  137  --  135  --  135 133*  K 3.3*   < > 5.2*   < > 4.5   < > 4.4   < > 4.4 4.3  CL 99  --  99  --   --   --  101  --  100 100  CO2 31  --  25  --   --   --  25  --  27 24  GLUCOSE 122*  --  120*  --   --   --  113*  --  116* 103*  BUN 11  --  13  --   --   --  16  --  17 17  CREATININE 1.05  --  1.10  --   --   --  1.23  --  1.20 1.41*  CALCIUM 8.9  --  9.2  --   --   --  9.0  --  9.1 9.0  MG 1.5*  --  1.8  --   --   --  2.0  --   --   --    < > = values in this interval not displayed.   GFR Estimated Creatinine Clearance: 54.1 mL/min (A) (by C-G formula based on SCr of 1.41 mg/dL (H)). Liver Function Tests: Recent Labs  Lab 12/22/19 0305  AST 32  ALT 28  ALKPHOS 62  BILITOT 1.2  PROT 6.6  ALBUMIN 3.1*   No results for input(s): LIPASE, AMYLASE in the last 168 hours. No results for input(s): AMMONIA in the last 168 hours. Coagulation profile No results for input(s): INR, PROTIME in the last 168 hours.  CBC: Recent Labs  Lab 12/22/19 0909 12/22/19 0909 12/23/19 0501 12/23/19 0920 12/24/19 0516 12/25/19 0405 12/26/19 0247  WBC 4.7  --  4.7  --  5.3 5.2 4.8  NEUTROABS 2.6  --   --   --   --   --   --   HGB 16.4   < > 17.0 18.4* 17.3* 16.9 16.3  HCT 49.3   < > 51.7 54.0* 50.9 50.9 48.5  MCV 90.8  --  90.7  --  88.4 89.6 89.6  PLT 182  --  172  --  194 206 216   < > = values in this interval not displayed.   Cardiac Enzymes: No results for  input(s): CKTOTAL, CKMB, CKMBINDEX, TROPONINI in the last 168 hours. BNP (last 3 results) No results for input(s): PROBNP in the last 8760 hours. CBG: No results for input(s): GLUCAP in the last 168 hours. D-Dimer: No results  for input(s): DDIMER in the last 72 hours. Hgb A1c: No results for input(s): HGBA1C in the last 72 hours. Lipid Profile: No results for input(s): CHOL, HDL, LDLCALC, TRIG, CHOLHDL, LDLDIRECT in the last 72 hours. Thyroid function studies: No results for input(s): TSH, T4TOTAL, T3FREE, THYROIDAB in the last 72 hours.  Invalid input(s): FREET3 Anemia work up: No results for input(s): VITAMINB12, FOLATE, FERRITIN, TIBC, IRON, RETICCTPCT in the last 72 hours. Sepsis Labs: Recent Labs  Lab 12/23/19 0501 12/24/19 0516 12/25/19 0405 12/26/19 0247  WBC 4.7 5.3 5.2 4.8    Microbiology Recent Results (from the past 240 hour(s))  SARS CORONAVIRUS 2 (TAT 6-24 HRS) Nasopharyngeal Nasopharyngeal Swab     Status: None   Collection Time: 12/22/19  3:30 AM   Specimen: Nasopharyngeal Swab  Result Value Ref Range Status   SARS Coronavirus 2 NEGATIVE NEGATIVE Final    Comment: (NOTE) SARS-CoV-2 target nucleic acids are NOT DETECTED. The SARS-CoV-2 RNA is generally detectable in upper and lower respiratory specimens during the acute phase of infection. Negative results do not preclude SARS-CoV-2 infection, do not rule out co-infections with other pathogens, and should not be used as the sole basis for treatment or other patient management decisions. Negative results must be combined with clinical observations, patient history, and epidemiological information. The expected result is Negative. Fact Sheet for Patients: SugarRoll.be Fact Sheet for Healthcare Providers: https://www.woods-mathews.com/ This test is not yet approved or cleared by the [place] FDA and  has been authorized for detection and/or diagnosis of  SARS-CoV-2 by FDA under an Emergency Use Authorization (EUA). This EUA will remain  in effect (meaning this test can be used) for the duration of the COVID-19 declaration under Section 56 4(b)(1) of the Act, 21 U.S.C. section 360bbb-3(b)(1), unless the authorization is terminated or revoked sooner. Performed at [facility], [place] [address] [place], [place] [address]   Surgical PCR screen     Status: None   Collection Time: 12/26/19  6:52 AM   Specimen: Nasal Mucosa; Nasal Swab  Result Value Ref Range Status   MRSA, PCR NEGATIVE NEGATIVE Final   Staphylococcus aureus NEGATIVE NEGATIVE Final    Comment: (NOTE) The Xpert SA Assay (FDA approved for NASAL specimens in patients 16 years of age and older), is one component of a comprehensive surveillance program. It is not intended to diagnose infection nor to guide or monitor treatment. Performed at [facility], [place] [address], [place], [place] [address]     Procedures and diagnostic studies:  NM CARDIAC AMYLOID TUMOR LOC INFLAM SPECT 1 DAY  Result Date: 12/26/2019 CLINICAL DATA:  HEART FAILURE. CONCERN FOR CARDIAC AMYLOIDOSIS. EXAM: NUCLEAR MEDICINE TUMOR LOCALIZATION. PYP CARDIAC AMYLOIDOSIS SCAN WITH SPECT TECHNIQUE: Following intravenous administration of radiopharmaceutical, anterior planar images of the chest were obtained. Regions of interest were placed on the heart and contralateral chest wall for quantitative assessment. Additional SPECT imaging of the chest was obtained. RADIOPHARMACEUTICALS:  21.4 mCi TECHNETIUM 99 PYROPHOSPHATE FINDINGS: Planar Visual assessment: Anterior planar imaging demonstrates radiotracer uptake within the heart less than uptake within the adjacent ribs (Grade 1). Quantitative assessment : Quantitative assessment of the cardiac uptake compared to the contralateral chest wall is equal to 1.2 (H/CL = 1.2). SPECT assessment: SPECT imaging of the chest demonstrates minimal radiotracer accumulation  within the LEFT ventricle. IMPRESSION: Visual and quantitative assessment (grade 1.2, H/CL equal 1) are NOT suggestive of transthyretin amyloidosis. Electronically Signed   By: Suzy Bouchard M.D.   On:  12/26/2019 07:48    Medications:    aspirin EC  81 mg Oral Daily   carvedilol  9.375 mg Oral BID WC   digoxin  0.125 mg Oral Daily   enoxaparin (LOVENOX) injection  40 mg Subcutaneous Q24H   furosemide  20 mg Oral Daily   guaiFENesin  1,200 mg Oral BID   predniSONE  4 mg Oral Q breakfast   sacubitril-valsartan  1 tablet Oral BID   sodium chloride flush  3 mL Intravenous Once   sodium chloride flush  3 mL Intravenous Q12H   sodium chloride flush  3 mL Intravenous Q12H   sodium chloride flush  3 mL Intravenous Q12H   spironolactone  25 mg Oral Daily   Continuous Infusions:  sodium chloride     sodium chloride Stopped (12/23/19 1918)    ceFAZolin (ANCEF) IV 1 g (12/26/19 1603)     LOS: 4 days   Lucky Cowboy MD [facility]   How to contact the [facility] Attending or Consulting provider [place] or covering provider during after hours [place], for this patient?  1. Check the care team in [facility] and look for a) attending/consulting TR[facility] provider listed and b) the [facility] team listed 2. Log into [email or web address] and use [place]'s universal password to access. If you do not have the password, please contact the hospital operator. 3. Locate the [facility] provider you are looking for under [facility] and page to a number that you can be directly reached. 4. If you still have difficulty reaching the provider, please page the [facility] (Director on Call) for the Hospitalists listed on amion for assistance.  12/26/2019, 4:35 PM

## 2019-12-26 NOTE — Interval H&P Note (Signed)
ICD Criteria  Current LVEF:15%. Within 12 months prior to implant: Yes   Heart failure history: Yes, Class III  Cardiomyopathy history: Yes, Non-Ischemic Cardiomyopathy.  Atrial Fibrillation/Atrial Flutter: No.  Ventricular tachycardia history: Yes, Hemodynamic instability present. VT Type: Non-Sustained Ventricular Tachycardia.  Cardiac arrest history: No.  History of syndromes with risk of sudden death: No.  Previous ICD: No.  Current ICD indication: Secondary  PPM indication: No.  Class I or II Bradycardia indication present: No  Beta Blocker therapy for 3 or more months: Yes, prescribed.   Ace Inhibitor/ARB therapy for 3 or more months: Yes, prescribed.    I have seen Brandon Frye is a 74 y.o. malepre-procedural and has been referred by DM for consideration of ICD implant for secondary./primary  prevention of sudden death.  The patient's chart has been reviewed and they meet criteria for ICD implant.  I have had a thorough discussion with the patient reviewing options.  The patient and their family (if available) have had opportunities to ask questions and have them answered. The patient and I have decided together through the [facility] Share Decision Support Tool to implant ICD at this time.  Risks, benefits, alternatives to ICD implantation were discussed in detail with the patient today. The patient  understands that the risks include but are not limited to bleeding, infection, pneumothorax, perforation, tamponade, vascular damage, renal failure, MI, stroke, death, inappropriate shocks, and lead dislodgement and    wishes to proceed.  History and Physical Interval Note:  12/26/2019 7:32 AM  Brandon Frye  has presented today for surgery, with the diagnosis of VT.  The various methods of treatment have been discussed with the patient and family. After consideration of risks, benefits and other options for treatment, the patient has consented to   Procedure(s): ICD IMPLANT (N/A) as a surgical intervention.  The patient's history has been reviewed, patient examined, no change in status, stable for surgery.  I have reviewed the patient's chart and labs.  Questions were answered to the patient's satisfaction.     Sherryl Manges

## 2019-12-27 ENCOUNTER — Inpatient Hospital Stay (HOSPITAL_COMMUNITY): Payer: No Typology Code available for payment source

## 2019-12-27 LAB — CBC
HCT: 51 % (ref 39.0–52.0)
Hemoglobin: 17.4 g/dL — ABNORMAL HIGH (ref 13.0–17.0)
MCH: 30 pg (ref 26.0–34.0)
MCHC: 34.1 g/dL (ref 30.0–36.0)
MCV: 87.9 fL (ref 80.0–100.0)
Platelets: 209 10*3/uL (ref 150–400)
RBC: 5.8 MIL/uL (ref 4.22–5.81)
RDW: 13 % (ref 11.5–15.5)
WBC: 4.2 10*3/uL (ref 4.0–10.5)
nRBC: 0 % (ref 0.0–0.2)

## 2019-12-27 LAB — BASIC METABOLIC PANEL
Anion gap: 10 (ref 5–15)
BUN: 14 mg/dL (ref 8–23)
CO2: 24 mmol/L (ref 22–32)
Calcium: 9.4 mg/dL (ref 8.9–10.3)
Chloride: 100 mmol/L (ref 98–111)
Creatinine, Ser: 1.03 mg/dL (ref 0.61–1.24)
GFR calc Af Amer: >60 mL/min (ref >60)
GFR calc non Af Amer: >60 mL/min (ref >60)
Glucose, Bld: 107 mg/dL — ABNORMAL HIGH (ref 70–99)
Potassium: 4.3 mmol/L (ref 3.5–5.1)
Sodium: 134 mmol/L — ABNORMAL LOW (ref 135–145)

## 2019-12-27 LAB — IMMUNOFIXATION, URINE

## 2019-12-27 MED ORDER — SPIRONOLACTONE 25 MG PO TABS: 25.0000 mg | ORAL_TABLET | Freq: Every day | ORAL | 1 refills | Status: DC

## 2019-12-27 MED ORDER — ACETAMINOPHEN 500 MG PO TABS: 1000.0000 mg | ORAL_TABLET | Freq: Four times a day (QID) | ORAL | 0 refills | Status: DC | PRN

## 2019-12-27 MED ORDER — PREDNISONE 1 MG PO TABS: 4.0000 mg | ORAL_TABLET | Freq: Every day | ORAL | 0 refills | Status: DC

## 2019-12-27 MED ORDER — FUROSEMIDE 20 MG PO TABS: 20.0000 mg | ORAL_TABLET | Freq: Every day | ORAL | 1 refills | Status: DC

## 2019-12-27 MED ORDER — CARVEDILOL 12.5 MG PO TABS
12.5000 mg | ORAL_TABLET | Freq: Two times a day (BID) | ORAL | Status: DC
  Administered 2019-12-27: 12.5 mg via ORAL
  Filled 2019-12-27: qty 1

## 2019-12-27 MED ORDER — SACUBITRIL-VALSARTAN 24-26 MG PO TABS: 1.0000 | ORAL_TABLET | Freq: Two times a day (BID) | ORAL | 1 refills | Status: DC

## 2019-12-27 MED ORDER — CARVEDILOL 12.5 MG PO TABS: 12.5000 mg | ORAL_TABLET | Freq: Two times a day (BID) | ORAL | 1 refills | Status: DC

## 2019-12-27 MED ORDER — DIGOXIN 125 MCG PO TABS: 0.1250 mg | ORAL_TABLET | Freq: Every day | ORAL | 1 refills | Status: DC

## 2019-12-27 MED ORDER — RIVAROXABAN 10 MG PO TABS: 10.0000 mg | ORAL_TABLET | Freq: Every day | ORAL | 1 refills | Status: DC

## 2019-12-27 MED FILL — Lidocaine HCl Local Inj 1%: INTRAMUSCULAR | Qty: 60 | Status: AC

## 2019-12-27 MED FILL — CARVEDILOL 12.5 MG TABLET: 12.5 | 30 days supply | Qty: 60 | Fill #0

## 2019-12-27 MED FILL — DIGOXIN 0.125 MG TABLET: 125 | 30 days supply | Qty: 30 | Fill #0

## 2019-12-27 MED FILL — predniSONE 1 MG TABS: 1 | 30 days supply | Qty: 120 | Fill #0

## 2019-12-27 MED FILL — ENTRESTO 24 MG-26 MG TABLET: 24-26 | 30 days supply | Qty: 60 | Fill #0

## 2019-12-27 MED FILL — FUROSEMIDE 20 MG TAB: 20 | 30 days supply | Qty: 30 | Fill #0

## 2019-12-27 MED FILL — SPIRONOLACTONE 25 MG TABLET: 25 | 30 days supply | Qty: 30 | Fill #0

## 2019-12-27 NOTE — Progress Notes (Addendum)
Pt s/p MDT single chamber ICD 2.22.2021  Device site stable today with Dermabond in place. No hematoma or ecchymosis noted.   He may shower tomorrow, 12/28/2019.  CXR stable lead placement with no PTX.   He is stable for discharge home from EP perspective with usual follow up for device in place and instructions in AVS.   OK to resume daily Xarelto on Thursday, 12/28/2018.  Casimiro Needle [address]" [place], PA-C  12/27/2019 9:35 AM

## 2019-12-27 NOTE — Plan of Care (Signed)
  Problem: Activity: Goal: Capacity to carry out activities will improve Outcome: Progressing   Problem: Cardiac: Goal: Ability to achieve and maintain adequate cardiopulmonary perfusion will improve Outcome: Progressing   

## 2019-12-27 NOTE — Discharge Instructions (Signed)
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 Do not lift your arm above shoulder height for 1 week after your procedure. After 7 days, you may progress as below.     Tuesday January 03, 2020  Wednesday January 04, 2020 Thursday January 05, 2020 Friday January 06, 2020   . Do not lift, push, pull, or carry anything over 10 pounds with the affected arm until 6 weeks (Tuesday February 07, 2020 ) after your procedure.   . Do not drive until you have been seen for your wound check, or as long as instructed by your healthcare provider.   . Monitor your defibrillator site for redness, swelling, and drainage. Call the device clinic at [phone] if you experience these symptoms or fever/chills.  . If your incision is closed with Dermabond/Surgical glue. You may shower 1 day after your pacemaker implant and wash around the site with soap and water.  Avoid lotions, ointments, or perfumes over your incision until it is well-healed.  . You may use a hot tub or a pool AFTER your wound check appointment if the incision is completely closed.  . Your ICD  may be MRI compatible. This will be discussed at your next office visit/wound check.   . Your ICD is designed to protect you from life threatening heart rhythms. Because of this, you may receive a shock.   o 1 shock with no symptoms:  Call the office during business hours. o 1 shock with symptoms (chest pain, chest pressure, dizziness, lightheadedness, shortness of breath, overall feeling unwell):  Call 911. o If you experience 2 or more shocks in 24 hours:  Call 911. o If you receive a shock, you should not drive for 6 months per the [place] DMV IF you receive appropriate therapy from your ICD.   . ICD Alerts:  Some alerts are vibratory and others beep. These are NOT emergencies. Please call our office to let us know. If this occurs at night or on weekends, it can wait until the next business day. Send a remote  transmission.  . If your device is capable of reading fluid status (for heart failure), you will be offered monthly monitoring to review this with you.   . Remote monitoring is used to monitor your ICD from home. This monitoring is scheduled every 91 days by our office. It allows [place] to keep an eye on the functioning of your device to ensure it is working properly. You will routinely see your Electrophysiologist annually (more often if necessary).    Cardioverter Defibrillator Implantation, Care After This sheet gives you information about how to care for yourself after your procedure. Your health care provider may also give you more specific instructions. If you have problems or questions, contact your health care provider. What can I expect after the procedure? After the procedure, it is common to have:  Some pain. It may last a few days.  A slight bump over the skin where the device was placed. Sometimes, it is possible to feel the device under the skin. This is normal.  During the months and years after your procedure, your health care provider will check the device, the leads, and the battery every few months. Eventually, when the battery is low, the device will be replaced.  You should receive your defibrillator ID card for your new device in the next 4-8 weeks.  Follow these instructions at home: Medicines  Take over-the-counter and prescription medicines  only as told by your health care provider.  If you were prescribed an antibiotic medicine, take it as told by your health care provider. Do not stop taking the antibiotic even if you start to feel better. Incision care        Follow instructions from your health care provider about how to take care of your incision area. Make sure you: ? Leave stitches (sutures), skin glue, or adhesive strips in place. These skin closures may need to stay in place for 2 weeks or longer. If adhesive strip edges start to loosen and curl up, you  may trim the loose edges. Do not remove adhesive strips completely unless your health care provider tells you to do that.  Check your incision area every day for signs of infection. Check for: ? More redness, swelling, or pain. ? More fluid or blood. ? Warmth. ? Pus or a bad smell.  Do not use lotions or ointments near the incision area unless told by your health care provider.  Keep the incision area clean and dry for 7 days after the procedure or for as long as told by your health care provider. It takes several weeks for the incision site to heal completely.  Do not take baths, swim, or use a hot tub until your health care provider approves. Activity  Try to walk a little every day. Exercising is important after this procedure. Also, use your shoulder on the side of the defibrillator in daily tasks that do not require a lot of motion.  For at least 1 week: ? Do not lift your upper arm above your shoulders. This means no tennis, golf, or swimming for this period of time. If you tend to sleep with your arm above your head, use a restraint to prevent this during sleep.  For at least 6 weeks: ? Avoid sudden jerking, pulling, or chopping movements that pull your upper arm far away from your body.  Ask your health care provider when you may go back to work.  Check with your health care provider before you start to drive or play sports. Electric and magnetic fields  Tell all health care providers that you have a defibrillator. This may prevent them from giving you an MRI scan because strong magnets are used for that test.  If you must pass through a metal detector, quickly walk through it. Do not stop under the detector, and do not stand near it.  Avoid places or objects that have a strong electric or magnetic field, including: ? [employer]. At the airport, let officials know that you have a defibrillator. Your defibrillator ID card will let you be checked in a way that is  safe for you and will not damage your defibrillator. Also, do not let a security person wave a magnetic wand near your defibrillator. That can make it stop working. ? Power plants. ? Large electrical generators. ? Anti-theft systems or electronic article surveillance (EAS). ? Radiofrequency transmission towers, such as cell phone and radio towers.  Do not use amateur (ham) radio equipment or electric (arc) welding torches. Some devices are safe to use if held at least 12 inches (30 cm) from your defibrillator. These include power tools, lawn mowers, and speakers. If you are unsure if something is safe to use, ask your health care provider.  Do not use MP3 player headphones. They have magnets.  You may safely use electric blankets, heating pads, computers, and microwave ovens.  When using your cell  phone, hold it to the ear that is on the opposite side from the defibrillator. Do not leave your cell phone in a pocket over the defibrillator. General instructions  Follow diet instructions from your health care provider, if this applies.  Always keep your defibrillator ID card with you. The card should list the implant date, device model, and manufacturer. Consider wearing a medical alert bracelet or necklace.  Have your defibrillator checked every 3-6 months or as often as told by your health care provider. Most defibrillators last for 4-8 years.  Keep all follow-up visits as told by your health care provider. This is important for your health care provider to make sure your chest is healing the way it should. Ask your health care provider when you should come back to have your stitches or staples taken out. Contact a health care provider if:  You gain weight suddenly.  Your legs or feet swell more than they have before.  It feels like your heart is fluttering or skipping beats (heart palpitations).  You have more redness, swelling, or pain around your incision.  You have more fluid or  blood coming from your incision.  Your incision feels warm to the touch.  You have pus or a bad smell coming from your incision.  You have a fever. Get help right away if:  You have chest pain.  You feel more than one shock.  You feel more short of breath than you have felt before.  You feel more light-headed than you have felt before.  Your incision starts to open up. This information is not intended to replace advice given to you by your health care provider. Make sure you discuss any questions you have with your health care provider.

## 2019-12-27 NOTE — Discharge Summary (Signed)
Physician Discharge Summary  Brandon Frye WUJ:[id number] DOB: 06-Jun-1946 DOA: 12/21/2019  PCP: [facility]  Admit date: 12/21/2019 Discharge date: 12/27/2019  Admitted From: Home Disposition:  home  Recommendations for Outpatient Follow-up:  1. Discharged on Entresto 24/26 twice daily, spironolactone 25 mg daily, Coreg 12.5 mg twice daily, Lasix 20 mg daily, aspirin 81 mg daily, digoxin 0.125 mg daily per heart failure recommendations.  Multiple myeloma panel pending.  2. Prophylactic Xarelto 10 mg to be restarted on 12/29/2019 per EP.  3. Appropriate follow-up with heart failure team and EP in place.  Home Health: No Equipment/Devices: None  Discharge Condition: Stable CODE STATUS: Full code Diet recommendation: Cardiac diet  Brief/Interim Summary: 74 year old African-American male who was sent by cardiologist at [facility] to be to the ED for concerning echocardiogram results.  He reported worsening dyspnea and orthopnea.  Cardiology consulted for worsening cardiomyopathy.  Echocardiogram in 2/21 at the [facility] showed EF 25 to 30% with bull's-eye pattern on strain imaging.  Coronary angiogram and right heart cath on 2/19 showed nonobstructive CAD.  Cardiac MRI showed LVEF 16% with noncoronary late gadolinium enhancement pattern which could be consistent with previous myocarditis or sarcoidosis, less likely cardiac amyloidosis. Work-up for sarcoidosis including HRCT and ACE levels negative.  PYP scan ordered for amyloidosis negative.  Urine immunofixation negative.  To be noted he has been on steroids for arthritis and is currently prescribed by rheumatology at [facility] being weaned. Noted to have multiple NSVT's in 2 weeks Zio patch up to 9 seconds.  Medication adjustment made by heart failure team and EP team as noted above.  Underwent ICD insertion on 12/26/2019 with no immediate complications.  Remained hemodynamically stable throughout hospitalization.  Discharge Diagnoses:   Principal Problem:   Acute on chronic congestive heart failure (HCC) Active Problems:   Cardiomyopathy (HCC)   COPD (chronic obstructive pulmonary disease) (HCC)   Hypertensive urgency   Lung nodule   Arthritis   Heart failure (HCC)   Syncope   NSVT (nonsustained ventricular tachycardia) (HCC)   Acute on chronic combined systolic and diastolic CHF (congestive heart failure) ([facility])    Discharge Instructions:  Discharge Instructions    (HEART FAILURE PATIENTS) Call MD:  Anytime you have any of the following symptoms: 1) 3 pound weight gain in 24 hours or 5 pounds in 1 week 2) shortness of breath, with or without a dry hacking cough 3) swelling in the hands, feet or stomach 4) if you have to sleep on extra pillows at night in order to breathe.   Complete by: As directed    Call MD for:  redness, tenderness, or signs of infection (pain, swelling, redness, odor or green/yellow discharge around incision site)   Complete by: As directed    Diet - low sodium heart healthy   Complete by: As directed    Increase activity slowly   Complete by: As directed      Allergies as of 12/27/2019   No Known Allergies     Medication List    TAKE these medications   acetaminophen 500 MG tablet Commonly known as: TYLENOL Take 2 tablets (1,000 mg total) by mouth every 6 (six) hours as needed for mild pain, moderate pain or headache.   aspirin EC 81 MG tablet Take 81 mg by mouth daily.   carvedilol 12.5 MG tablet Commonly known as: COREG Take 1 tablet (12.5 mg total) by mouth 2 (two) times daily with a meal. What changed: how much to take   digoxin  0.125 MG tablet Commonly known as: LANOXIN Take 1 tablet (0.125 mg total) by mouth daily. Start taking on: December 28, 2019   furosemide 20 MG tablet Commonly known as: LASIX Take 1 tablet (20 mg total) by mouth daily. Start taking on: December 28, 2019   Ipratropium-Albuterol 20-100 MCG/ACT Aers respimat Commonly known as: Combivent  Respimat Inhale 1 puff into the lungs every 6 (six) hours as needed for wheezing or shortness of breath (or coughing).   nitroGLYCERIN 0.4 MG SL tablet Commonly known as: NITROSTAT Place 0.4 mg under the tongue every 5 (five) minutes as needed for chest pain.   predniSONE 1 MG tablet Commonly known as: DELTASONE Take 4 tablets (4 mg total) by mouth daily. What changed:   medication strength  how much to take   rivaroxaban 10 MG Tabs tablet Commonly known as: XARELTO Take 1 tablet (10 mg total) by mouth daily. Start 12/29/19 Start taking on: December 29, 2019 What changed:   additional instructions  These instructions start on December 29, 2019. If you are unsure what to do until then, ask your doctor or other care provider.   sacubitril-valsartan 24-26 MG Commonly known as: ENTRESTO Take 1 tablet by mouth 2 (two) times daily.   spironolactone 25 MG tablet Commonly known as: ALDACTONE Take 1 tablet (25 mg total) by mouth daily. Start taking on: December 28, 2019      Follow-up Information    [facility] Follow up on 01/05/2020.   Specialty: Cardiology Why: 10:30 AM  [facility] (Dr. McLean's office) Parking Garage Code [id number] Contact information: [address] [id number] [place] [address] [phone]       Klein, Steven C, MD Follow up on 03/27/2020.   Specialty: Cardiology Why: at 2 pm for 3 month post ICD check.  Contact information: [address] [place] [address] [phone]        [facility] Follow up on 01/05/2020.   Why: at 1100 am for post ICD wound check Contact information: [address] [place] [address] [phone]       [facility]   Why: Please follow up in a week Contact information: [address] [place]  [address] [phone]          No Known Allergies  Consultations:  Cardiology-heart failure and EP   Procedures/Studies: DG Chest 2 View  Result Date: 12/27/2019 CLINICAL DATA:  Postop from AICD placement. EXAM: CHEST - 2 VIEW COMPARISON:  12/21/2019 FINDINGS: Pain new single lead AICD is seen in place with tip within the right ventricle. No evidence of pneumothorax or pleural effusion. Both lungs are clear. Mild pleural thickening at left lung base remains stable. Heart size is normal. Aortic atherosclerosis incidentally noted. IMPRESSION: New AICD in appropriate position. No evidence of pneumothorax or other acute findings. Electronically Signed   By: John A Stahl M.D.   On: 12/27/2019 07:44   DG Chest 2 View  Result Date: 12/21/2019 CLINICAL DATA:  Shortness of breath EXAM: CHEST - 2 VIEW COMPARISON:  08/19/2019 FINDINGS: Chronic bronchitic changes. Hazy right infrahilar atelectasis or mild infiltrate. Stable cardiomediastinal silhouette with aortic atherosclerosis. No pneumothorax. IMPRESSION: 1. Hazy right infrahilar atelectasis or potential developing infiltrate 2. Chronic bronchitic changes Electronically Signed   By: Kim  Fujinaga M.D.   On: 12/21/2019 19:11   CT Angio Chest PE W and/or Wo Contrast  Result   Date: 12/22/2019 CLINICAL DATA:  Shortness of breath. Pulmonary embolism (PE), suspected EXAM: CT ANGIOGRAPHY CHEST WITH CONTRAST TECHNIQUE: Multidetector CT imaging of the chest was performed using the standard protocol during bolus administration of intravenous contrast. Multiplanar CT image reconstructions and MIPs were obtained to evaluate the vascular anatomy. CONTRAST:  51m OMNIPAQUE IOHEXOL 350 MG/ML SOLN COMPARISON:  Radiograph yesterday. Chest CTA 08/19/2019 FINDINGS: Cardiovascular: Excellent opacification of pulmonary arteries. There are no filling defects within the pulmonary arteries to suggest pulmonary embolus. Mild aortic atherosclerosis. No aortic aneurysm. Cannot  assess for dissection given phase of contrast tailored to pulmonary artery evaluation. There are coronary artery calcifications. Multi chamber cardiomegaly. Contrast refluxes into the hepatic veins and IVC. Mediastinum/Nodes: Small mediastinal lymph nodes not enlarged by size criteria. Ill-defined density in the hilar regions likely nodal tissue, not pathologically enlarged. No esophageal wall thickening. No suspicious thyroid nodule. Lungs/Pleura: Severe bronchial thickening which leads to bronchial occlusion in the peripheral branches. Subpleural atelectasis in the left lower lobe. Minimal linear scarring in the left upper lobe. 7 mm subpleural nodule in the left upper lobe series 6, image 54 is unchanged from prior. No endobronchial lesion. No evidence of pneumonia. Mild emphysema. No pleural fluid. No pulmonary edema. No pulmonary mass. Upper Abdomen: Contrast refluxing into the hepatic veins and IVC. No other acute findings. Musculoskeletal: Multilevel degenerative change in the spine. There are no acute or suspicious osseous abnormalities. Review of the MIP images confirms the above findings. IMPRESSION: 1. No pulmonary embolus. 2. Severe bronchial thickening which leads to bronchial occlusion in the peripheral branches. No evidence of pneumonia. 3. Multi chamber cardiomegaly with reflux of contrast into the hepatic veins and IVC consistent with elevated right heart pressures. Coronary artery calcifications. 4. Unchanged 7 mm subpleural left upper lobe pulmonary nodule from prior exam. Non-contrast chest CT at 6-12 months is recommended. If the nodule is stable at time of repeat CT, then future CT at 18-24 months (from today's scan) is considered optional for low-risk patients, but is recommended for high-risk patients. This recommendation follows the consensus statement: Guidelines for Management of Incidental Pulmonary Nodules Detected on CT Images: From the Fleischner Society 2017; Radiology 2017;  284:228-243. Aortic Atherosclerosis (ICD10-I70.0) and Emphysema (ICD10-J43.9). Electronically Signed   By: MKeith RakeM.D.   On: 12/22/2019 02:32   NM CARDIAC AMYLOID TUMOR LOC INFLAM SPECT 1 DAY  Result Date: 12/26/2019 CLINICAL DATA:  HEART FAILURE. CONCERN FOR CARDIAC AMYLOIDOSIS. EXAM: NUCLEAR MEDICINE TUMOR LOCALIZATION. PYP CARDIAC AMYLOIDOSIS SCAN WITH SPECT TECHNIQUE: Following intravenous administration of radiopharmaceutical, anterior planar images of the chest were obtained. Regions of interest were placed on the heart and contralateral chest wall for quantitative assessment. Additional SPECT imaging of the chest was obtained. RADIOPHARMACEUTICALS:  21.4 mCi TECHNETIUM 99 PYROPHOSPHATE FINDINGS: Planar Visual assessment: Anterior planar imaging demonstrates radiotracer uptake within the heart less than uptake within the adjacent ribs (Grade 1). Quantitative assessment : Quantitative assessment of the cardiac uptake compared to the contralateral chest wall is equal to 1.2 (H/CL = 1.2). SPECT assessment: SPECT imaging of the chest demonstrates minimal radiotracer accumulation within the LEFT ventricle. IMPRESSION: Visual and quantitative assessment (grade 1.2, H/CL equal 1) are NOT suggestive of transthyretin amyloidosis. Electronically Signed   By: SSuzy BouchardM.D.   On: 12/26/2019 07:48   CARDIAC CATHETERIZATION  Result Date: 12/23/2019 1. Low filling pressures after diuresis. 2. Low cardiac index at 1.8. 3. Nonobstructive mild coronary disease, nonischemic cardiomyopathy.   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 Shortness of breath. EXAM: CT CHEST WITHOUT CONTRAST TECHNIQUE: Multidetector CT imaging of the chest was performed following the standard protocol without intravenous contrast. High resolution imaging of the lungs, as well as inspiratory and expiratory imaging, was performed. COMPARISON:  Chest CT  12/22/2019. FINDINGS: Cardiovascular: Heart size is normal. There is no significant pericardial fluid, thickening or pericardial calcification. There is aortic atherosclerosis, as well as atherosclerosis of the great vessels of the mediastinum and the coronary arteries, including calcified atherosclerotic plaque in the left main, left anterior descending, left circumflex and right coronary arteries. Mild calcifications of the aortic valve. Mediastinum/Nodes: No pathologically enlarged mediastinal or hilar lymph nodes. Please note that accurate exclusion of hilar adenopathy is limited on noncontrast CT scans. Esophagus is unremarkable in appearance. No axillary lymphadenopathy. Lungs/Pleura: 5 mm subpleural nodule in the periphery of the left upper lobe (axial image 46 of series 10), nonspecific, but slightly decreased in size compared to the prior study, likely a benign subpleural lymph node. No other suspicious appearing pulmonary nodules or masses are noted. Areas of linear scarring are noted in left upper and left lower lobes. No acute consolidative airspace disease. No pleural effusions. Diffuse bronchial wall thickening. High-resolution images demonstrate no significant regions of ground-glass attenuation, septal thickening, traction bronchiectasis or honeycombing. Upper Abdomen: Aortic atherosclerosis. Musculoskeletal: There are no aggressive appearing lytic or blastic lesions noted in the visualized portions of the skeleton. IMPRESSION: 1. No definite findings to suggest interstitial lung disease. 2. Diffuse bronchial wall thickening, which could suggest chronic bronchitis. 3. No definite imaging stigmata of sarcoidosis identified on today's examination. 4. Slight decreased size of 5 mm subpleural nodule in the periphery of the left upper lobe, likely a benign subpleural lymph node. 5. Aortic atherosclerosis, in addition to left main and 3 vessel coronary artery disease. Assessment for potential risk factor  modification, dietary therapy or pharmacologic therapy may be warranted, if clinically indicated. 6. There are calcifications of the aortic valve. Echocardiographic correlation for evaluation of potential valvular dysfunction may be warranted if clinically indicated. Aortic Atherosclerosis (ICD10-I70.0). Electronically Signed   By: Vinnie Langton M.D.   On: 12/24/2019 14:49   MR CARDIAC MORPHOLOGY W WO CONTRAST  Result Date: 12/23/2019 CLINICAL DATA:  Nonischemic cardiomyopathy EXAM: CARDIAC MRI TECHNIQUE: The patient was scanned on a 1.5 Tesla GE magnet. A dedicated cardiac coil was used. Functional imaging was done using Fiesta sequences. 2,3, and 4 chamber views were done to assess for RWMA's. Modified Simpson's rule using a short axis stack was used to calculate an ejection fraction on a dedicated work Conservation officer, nature. The patient received 10 cc of Gadavist. After 10 minutes inversion recovery sequences were used to assess for infiltration and scar tissue. CONTRAST:  Gadavist 10 cc FINDINGS: Limited images of the lung fields showed no gross abnormalities. The left ventricle is moderately dilated with mild LV hypertrophy. Diffuse severe hypokinesis, EF 16%. Normal right ventricular size with moderate systolic dysfunction, EF 64%. Mild to moderate left atrial enlargement. Normal right atrial size. Trileaflet aortic valve with no significant regurgitation or stenosis. Trivial mitral regurgitation. On delayed enhancement imaging, there was mid-wall late gadolinium enhancement (LGE) extensively in the basal to mid anteroseptal and inferoseptal walls as well as significant LGE at the anteroseptal and inferoseptal RV insertion sites. Biatrial LGE was also noted. Measurements: LVEDV 283 mL LVSV 46 mL LVEF 16% RVEDV 171 mL RVSV 52 mL RVEF 31% ECV 38% in septum IMPRESSION:  1. Moderately dilated LV with mild LV hypertrophy. EF 16%, diffuse hypokinesis. 2. Normal right ventricular size with moderately  decreased systolic function, EF 20%. 3. Non-coronary LGE pattern, primarily mid-wall septal involvement. This could be consistent with prior myocarditis or sarcoidosis, less likely cardiac amyloidosis. ECV percentage is increased in the septum, but does not appear to be in the amyloidosis range (>40%). Brandon Frye Electronically Signed   By: Loralie Champagne M.D.   On: 12/23/2019 16:58       Subjective: No complaints.  Anxious for discharge.  Explained waiting on cardiology to give a start date for Xarelto given recent ICD placement.  Discharge orders placed once recommendations received to restart Xarelto on 2/25.  Scripts printed so that he could get refills from the [facility] but this would close by the time he was discharged.  He expressed frustration regarding this.  Spoke with primary nurse who requested pharmacy to provide with medications required for this evening.  Per RN, pharmacy working on it.  Discharge Exam: Vitals:   12/27/19 0819 12/27/19 1144  BP:  100/69  Pulse: 77 76  Resp:  19  Temp:  98.3 F (36.8 C)  SpO2:  95%   Vitals:   12/27/19 0352 12/27/19 0808 12/27/19 0819 12/27/19 1144  BP: 113/80 118/83  100/69  Pulse: 71 72 77 76  Resp: _0 Temp: 98 F (36.7 C) 98.1 F (36.7 C)  98.3 F (36.8 C)  TempSrc: Oral   Oral  SpO2: 97% 97%  95%  Weight:      Height:        General: Pt is alert, awake, not in acute distress Cardiovascular: RRR, S1/S2 +, no rubs, no gallops, ICD left upper chest (site with no erythema) Respiratory: CTA bilaterally, no wheezing, no rhonchi Abdominal: Soft, NT, ND, bowel sounds + Extremities: no edema, no cyanosis    The results of significant diagnostics from this hospitalization (including imaging, microbiology, ancillary and laboratory) are listed below for reference.     Microbiology: Recent Results (from the past 240 hour(s))  SARS CORONAVIRUS 2 (TAT 6-24 HRS) Nasopharyngeal Nasopharyngeal Swab     Status: None    Collection Time: 12/22/19  3:30 AM   Specimen: Nasopharyngeal Swab  Result Value Ref Range Status   SARS Coronavirus 2 NEGATIVE NEGATIVE Final    Comment: (NOTE) SARS-CoV-2 target nucleic acids are NOT DETECTED. The SARS-CoV-2 RNA is generally detectable in upper and lower respiratory specimens during the acute phase of infection. Negative results do not preclude SARS-CoV-2 infection, do not rule out co-infections with other pathogens, and should not be used as the sole basis for treatment or other patient management decisions. Negative results must be combined with clinical observations, patient history, and epidemiological information. The expected result is Negative. Fact Sheet for Patients: SugarRoll.be Fact Sheet for Healthcare Providers: https://www.woods-mathews.com/ This test is not yet approved or cleared by the [place] FDA and  has been authorized for detection and/or diagnosis of SARS-CoV-2 by FDA under an Emergency Use Authorization (EUA). This EUA will remain  in effect (meaning this test can be used) for the duration of the COVID-19 declaration under Section 56 4(b)(1) of the Act, 21 U.S.C. section 360bbb-3(b)(1), unless the authorization is terminated or revoked sooner. Performed at [facility], [place] [address] [place], [place] [address]   Surgical PCR screen     Status: None   Collection Time: 12/26/19  6:52 AM   Specimen: Nasal Mucosa; Nasal  Swab  Result Value Ref Range Status   MRSA, PCR NEGATIVE NEGATIVE Final   Staphylococcus aureus NEGATIVE NEGATIVE Final    Comment: (NOTE) The Xpert SA Assay (FDA approved for NASAL specimens in patients 46 years of age and older), is one component of a comprehensive surveillance program. It is not intended to diagnose infection nor to guide or monitor treatment. Performed at [facility], [place] [address], [place], [place] [address]      Labs: BNP (last 3  results) Recent Labs    12/22/19 0127  BNP 381.7*   Basic Metabolic Panel: Recent Labs  Lab 12/22/19 0909 12/22/19 0909 12/23/19 0501 12/23/19 0501 12/23/19 0920 12/24/19 0516 12/25/19 0405 12/26/19 0247 12/27/19 0512  NA 139   < > 138   < > 137 135 135 133* 134*  K 3.3*   < > 5.2*   < > 4.5 4.4 4.4 4.3 4.3  CL 99   < > 99  --   --  101 100 100 100  CO2 31   < > 25  --   --  _0 GLUCOSE 122*   < > 120*  --   --  113* 116* 103* 107*  BUN 11   < > 13  --   --  _1 CREATININE 1.05   < > 1.10  --   --  1.23 1.20 1.41* 1.03  CALCIUM 8.9   < > 9.2  --   --  9.0 9.1 9.0 9.4  MG 1.5*  --  1.8  --   --  2.0  --   --   --    < > = values in this interval not displayed.   Liver Function Tests: Recent Labs  Lab 12/22/19 0305  AST 32  ALT 28  ALKPHOS 62  BILITOT 1.2  PROT 6.6  ALBUMIN 3.1*   No results for input(s): LIPASE, AMYLASE in the last 168 hours. No results for input(s): AMMONIA in the last 168 hours. CBC: Recent Labs  Lab 12/22/19 0909 12/22/19 0909 12/23/19 0501 12/23/19 0501 12/23/19 0920 12/24/19 0516 12/25/19 0405 12/26/19 0247 12/27/19 0512  WBC 4.7   < > 4.7  --   --  5.3 5.2 4.8 4.2  NEUTROABS 2.6  --   --   --   --   --   --   --   --   HGB 16.4   < > 17.0   < > 18.4* 17.3* 16.9 16.3 17.4*  HCT 49.3   < > 51.7   < > 54.0* 50.9 50.9 48.5 51.0  MCV 90.8   < > 90.7  --   --  88.4 89.6 89.6 87.9  PLT 182   < > 172  --   --  194 206 216 209   < > = values in this interval not displayed.   Cardiac Enzymes: No results for input(s): CKTOTAL, CKMB, CKMBINDEX, TROPONINI in the last 168 hours. BNP: Invalid input(s): POCBNP CBG: No results for input(s): GLUCAP in the last 168 hours. D-Dimer No results for input(s): DDIMER in the last 72 hours. Hgb A1c No results for input(s): HGBA1C in the last 72 hours. Lipid Profile No results for input(s): CHOL, HDL, LDLCALC, TRIG, CHOLHDL, LDLDIRECT in the last 72 hours. Thyroid function  studies No results for input(s): TSH, T4TOTAL, T3FREE, THYROIDAB in the last 72 hours.  Invalid input(s): FREET3 Anemia work up No results  for input(s): VITAMINB12, FOLATE, FERRITIN, TIBC, IRON, RETICCTPCT in the last 72 hours. Urinalysis    Component Value Date/Time   COLORURINE YELLOW 04/09/2017 2147   APPEARANCEUR CLEAR 04/09/2017 2147   LABSPEC 1.005 04/09/2017 2147   PHURINE 5.0 04/09/2017 2147   GLUCOSEU NEGATIVE 04/09/2017 2147   HGBUR NEGATIVE 04/09/2017 2147   BILIRUBINUR NEGATIVE 04/09/2017 2147   [place] NEGATIVE 04/09/2017 2147   PROTEINUR NEGATIVE 04/09/2017 2147   NITRITE NEGATIVE 04/09/2017 2147   LEUKOCYTESUR NEGATIVE 04/09/2017 2147   Sepsis Labs Invalid input(s): PROCALCITONIN,  WBC,  LACTICIDVEN Microbiology Recent Results (from the past 240 hour(s))  SARS CORONAVIRUS 2 (TAT 6-24 HRS) Nasopharyngeal Nasopharyngeal Swab     Status: None   Collection Time: 12/22/19  3:30 AM   Specimen: Nasopharyngeal Swab  Result Value Ref Range Status   SARS Coronavirus 2 NEGATIVE NEGATIVE Final    Comment: (NOTE) SARS-CoV-2 target nucleic acids are NOT DETECTED. The SARS-CoV-2 RNA is generally detectable in upper and lower respiratory specimens during the acute phase of infection. Negative results do not preclude SARS-CoV-2 infection, do not rule out co-infections with other pathogens, and should not be used as the sole basis for treatment or other patient management decisions. Negative results must be combined with clinical observations, patient history, and epidemiological information. The expected result is Negative. Fact Sheet for Patients: SugarRoll.be Fact Sheet for Healthcare Providers: https://www.woods-mathews.com/ This test is not yet approved or cleared by the [place] FDA and  has been authorized for detection and/or diagnosis of SARS-CoV-2 by FDA under an Emergency Use Authorization (EUA). This EUA will remain   in effect (meaning this test can be used) for the duration of the COVID-19 declaration under Section 56 4(b)(1) of the Act, 21 U.S.C. section 360bbb-3(b)(1), unless the authorization is terminated or revoked sooner. Performed at [facility], [place] [address] [place], [place] [address]   Surgical PCR screen     Status: None   Collection Time: 12/26/19  6:52 AM   Specimen: Nasal Mucosa; Nasal Swab  Result Value Ref Range Status   MRSA, PCR NEGATIVE NEGATIVE Final   Staphylococcus aureus NEGATIVE NEGATIVE Final    Comment: (NOTE) The Xpert SA Assay (FDA approved for NASAL specimens in patients 28 years of age and older), is one component of a comprehensive surveillance program. It is not intended to diagnose infection nor to guide or monitor treatment. Performed at [facility], [place] [address] [place], [place] [address]      Time coordinating discharge: Over 30 minutes  SIGNED:   Lucky Cowboy, MD  [facility] 12/27/2019, 3:26 PM  If 7PM-7AM, please contact night-coverage

## 2019-12-27 NOTE — Progress Notes (Signed)
Patient ID: Brandon Frye, male   DOB: [birth year], 74 y.o.   MRN: [id number] Pat    Advanced Heart Failure Rounding Note  PCP-Cardiologist: No primary care provider on file.   Subjective:    Medtronic ICD placed yesterday.  No complaints today.  Creatinine and BP stable, ready to go home.   CT chest: signs of chronic bronchitis, no ILD, no signs of sarcoidosis.   Cardiac MRI: 1. Moderately dilated LV with mild LV hypertrophy. EF 16%, diffuse hypokinesis. 2. Normal right ventricular size with moderately decreased systolic function, EF 31%. 3. Non-coronary LGE pattern, primarily mid-wall septal involvement. This could be consistent with prior myocarditis or sarcoidosis, less likely cardiac amyloidosis. ECV percentage is increased in the septum, but does not appear to be in the amyloidosis range (>40%).  RHC/LHC Coronary Findings  Diagnostic Dominance: Right Left Main  No significant coronary disease.  Left Anterior Descending  30% stenosis proximal LAD.  Ramus Intermedius  No significant coronary disease.  Left Circumflex  Luminal irregularities.  Right Coronary Artery  Luminal irregularities.  Intervention  No interventions have been documented. Right Heart  Right Heart Pressures RHC Procedural Findings: Hemodynamics (mmHg) RA mean 1 RV 35/3 PA 36/7, mean 20 PCWP mean 8 LV 85/14 AO 92/66  Oxygen saturations: PA 67% AO 99%  Cardiac Output (Fick) 3.65  Cardiac Index (Fick) 1.8     Objective:   Weight Range: 79.9 kg Body mass index is 23.25 kg/m.   Vital Signs:   Temp:  [97.4 F (36.3 C)-98.1 F (36.7 C)] 98.1 F (36.7 C) (02/23 0808) Pulse Rate:  [63-281] 72 (02/23 0808) Resp:  [0-32] 19 (02/23 0808) BP: (105-123)/(72-85) 118/83 (02/23 0808) SpO2:  [0 %-100 %] 97 % (02/23 0808) Weight:  [79.9 kg] 79.9 kg (02/23 0240) Last BM Date: 12/26/19  Weight change: Filed Weights   12/25/19 0413 12/26/19 0453 12/27/19 0240  Weight: 79.6 kg 79.6  kg 79.9 kg    Intake/Output:   Intake/Output Summary (Last 24 hours) at 12/27/2019 0810 Last data filed at 12/27/2019 0530 Gross per 24 hour  Intake 930 ml  Output 1625 ml  Net -695 ml      Physical Exam    General: NAD Neck: No JVD, no thyromegaly or thyroid nodule.  Lungs: Clear to auscultation bilaterally with normal respiratory effort. CV: Nondisplaced PMI.  Heart regular S1/S2, no S3/S4, no murmur.  No peripheral edema.   Abdomen: Soft, nontender, no hepatosplenomegaly, no distention.  Skin: Intact without lesions or rashes.  Neurologic: Alert and oriented x 3.  Psych: Normal affect. Extremities: No clubbing or cyanosis.  HEENT: Normal.    Telemetry   NSR 70s-80s (personally reviewed)  Labs    CBC Recent Labs    12/26/19 0247 12/27/19 0512  WBC 4.8 4.2  HGB 16.3 17.4*  HCT 48.5 51.0  MCV 89.6 87.9  PLT 216 209   Basic Metabolic Panel Recent Labs    34/74/25 0247 12/27/19 0512  NA 133* 134*  K 4.3 4.3  CL 100 100  CO2 24 24  GLUCOSE 103* 107*  BUN 17 14  CREATININE 1.41* 1.03  CALCIUM 9.0 9.4   Liver Function Tests No results for input(s): AST, ALT, ALKPHOS, BILITOT, PROT, ALBUMIN in the last 72 hours. No results for input(s): LIPASE, AMYLASE in the last 72 hours. Cardiac Enzymes No results for input(s): CKTOTAL, CKMB, CKMBINDEX, TROPONINI in the last 72 hours.  BNP: BNP (last 3 results) Recent Labs    12/22/19 0127  BNP  992.9*    ProBNP (last 3 results) No results for input(s): PROBNP in the last 8760 hours.   D-Dimer No results for input(s): DDIMER in the last 72 hours. Hemoglobin A1C No results for input(s): HGBA1C in the last 72 hours. Fasting Lipid Panel No results for input(s): CHOL, HDL, LDLCALC, TRIG, CHOLHDL, LDLDIRECT in the last 72 hours. Thyroid Function Tests No results for input(s): TSH, T4TOTAL, T3FREE, THYROIDAB in the last 72 hours.  Invalid input(s): FREET3  Other results:   Imaging    DG Chest 2  View  Result Date: 12/27/2019 CLINICAL DATA:  Postop from AICD placement. EXAM: CHEST - 2 VIEW COMPARISON:  12/21/2019 FINDINGS: Pain new single lead AICD is seen in place with tip within the right ventricle. No evidence of pneumothorax or pleural effusion. Both lungs are clear. Mild pleural thickening at left lung base remains stable. Heart size is normal. Aortic atherosclerosis incidentally noted. IMPRESSION: New AICD in appropriate position. No evidence of pneumothorax or other acute findings. Electronically Signed   By: Danae Orleans M.D.   On: 12/27/2019 07:44     Medications:     Scheduled Medications: . aspirin EC  81 mg Oral Daily  . carvedilol  12.5 mg Oral BID WC  . digoxin  0.125 mg Oral Daily  . enoxaparin (LOVENOX) injection  40 mg Subcutaneous Q24H  . furosemide  20 mg Oral Daily  . guaiFENesin  1,200 mg Oral BID  . predniSONE  4 mg Oral Q breakfast  . sacubitril-valsartan  1 tablet Oral BID  . sodium chloride flush  3 mL Intravenous Once  . sodium chloride flush  3 mL Intravenous Q12H  . sodium chloride flush  3 mL Intravenous Q12H  . sodium chloride flush  3 mL Intravenous Q12H  . spironolactone  25 mg Oral Daily    Infusions: . sodium chloride    . sodium chloride Stopped (12/23/19 1918)    PRN Medications: sodium chloride, sodium chloride, acetaminophen, ipratropium-albuterol, ondansetron (ZOFRAN) IV, oxyCODONE, sodium chloride flush, sodium chloride flush    Assessment/Plan   1. Acute on chronic systolic CHF: Cath in 2017 showed nonobstructive CAD in setting of EF 30%.  Echo in 2/21 at the [facility] showed EF 25-30% with bullseye pattern on strain imaging concerning for amyloidosis.  No family history of cardiomyopathy but father had MI at age 81.  Increased exertional symptoms as well as orthopnea for 2-3 weeks, no inciting event. HS-TnI in 20s range with no trend. RHC/LHC showed nonobstructive CAD, good filling pressures after diuresis, and low cardiac index at 1.8.    Cardiac MRI showed LV EF 16%, RVEF 31%, and non-coronary LGE pattern, primarily mid-wall septal involvement that could be consistent with prior myocarditis or sarcoidosis, less likely cardiac amyloidosis.  PYP scan negative.  CT chest w/o evidence for sarcoidosis and ACE level normal. He is not volume overloaded on exam.  - Continue Lasix 20 mg daily.  - Increase Coreg to 12.5 mg bid.  - Continue spironolactone 25 mg daily.  - Continue Entresto 24/26 bid.  - Have sent myeloma panel and urine immunofixation (still pending), as above PYP scan negative.  2. Syncope: Multiple NSVT runs on 2 wk Zio patch, up to 9 seconds.  He had a syncope episode 2-3 weeks ago, cough syncope vs arrhythmia.  No further VT, now off amiodarone. Now s/p MDT ICD.  - He will stay off amiodarone for now and I will titrate up Coreg.  3. 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  Will start back at discharge when device site is stable.  4. Disposition: Followup within 2 wks in CHF clinic.  Cardiac meds for home: Entresto 24/26 bid, spironolactone 25 daily, Coreg 12.5 mg bid, Lasix 20 daily, ASA 81 daily, digoxin 0.125 daily, restart Xarelto 10 mg daily when ok with EP.   Length of Stay: [place], [place]  12/27/2019, 8:10 AM  Advanced Heart Failure Team Pager [phone] (M-F; [place])  Please contact [facility] for night-coverage after hours (4p -7a ) and weekends on [email or web address]

## 2019-12-28 LAB — MULTIPLE MYELOMA PANEL, SERUM
Albumin SerPl Elph-Mcnc: UNDETERMINED g/dL
Albumin/Glob SerPl: UNDETERMINED
Alpha 1: UNDETERMINED g/dL
Alpha2 Glob SerPl Elph-Mcnc: UNDETERMINED g/dL
B-Globulin SerPl Elph-Mcnc: UNDETERMINED g/dL
Gamma Glob SerPl Elph-Mcnc: UNDETERMINED g/dL
Globulin, Total: UNDETERMINED g/dL
IFE 1: UNDETERMINED
IgA: 376 mg/dL (ref 61–437)
IgG (Immunoglobin G), Serum: 1136 mg/dL (ref 603–1613)
IgM (Immunoglobulin M), Srm: 141 mg/dL (ref 15–143)
M Protein SerPl Elph-Mcnc: UNDETERMINED g/dL
Total Protein ELP: UNDETERMINED g/dL

## 2020-01-05 ENCOUNTER — Emergency Department (HOSPITAL_COMMUNITY)
Admission: EM | Admit: 2020-01-05 | Discharge: 2020-01-05 | Disposition: A | Discharge status: Home / Self Care | Payer: No Typology Code available for payment source | Attending: Emergency Medicine | Admitting: Emergency Medicine

## 2020-01-05 ENCOUNTER — Ambulatory Visit (INDEPENDENT_AMBULATORY_CARE_PROVIDER_SITE_OTHER): Payer: Medicare Other | Admitting: Student

## 2020-01-05 ENCOUNTER — Encounter (HOSPITAL_COMMUNITY): Payer: Medicare Other

## 2020-01-05 ENCOUNTER — Other Ambulatory Visit: Payer: Self-pay

## 2020-01-05 ENCOUNTER — Encounter (HOSPITAL_COMMUNITY): Payer: Self-pay

## 2020-01-05 DIAGNOSIS — F17210 Nicotine dependence, cigarettes, uncomplicated: Secondary | ICD-10-CM | POA: Insufficient documentation

## 2020-01-05 DIAGNOSIS — Z7901 Long term (current) use of anticoagulants: Secondary | ICD-10-CM | POA: Diagnosis not present

## 2020-01-05 DIAGNOSIS — Z7982 Long term (current) use of aspirin: Secondary | ICD-10-CM | POA: Insufficient documentation

## 2020-01-05 DIAGNOSIS — Z79899 Other long term (current) drug therapy: Secondary | ICD-10-CM | POA: Diagnosis not present

## 2020-01-05 DIAGNOSIS — I5042 Chronic combined systolic (congestive) and diastolic (congestive) heart failure: Secondary | ICD-10-CM | POA: Diagnosis not present

## 2020-01-05 DIAGNOSIS — Z95810 Presence of automatic (implantable) cardiac defibrillator: Secondary | ICD-10-CM | POA: Insufficient documentation

## 2020-01-05 DIAGNOSIS — I110 Hypertensive heart disease with heart failure: Secondary | ICD-10-CM | POA: Diagnosis not present

## 2020-01-05 DIAGNOSIS — I5022 Chronic systolic (congestive) heart failure: Secondary | ICD-10-CM

## 2020-01-05 DIAGNOSIS — K625 Hemorrhage of anus and rectum: Secondary | ICD-10-CM | POA: Diagnosis not present

## 2020-01-05 DIAGNOSIS — R55 Syncope and collapse: Secondary | ICD-10-CM | POA: Diagnosis not present

## 2020-01-05 LAB — CUP PACEART INCLINIC DEVICE CHECK
Battery Remaining Longevity: 137 mo
Battery Voltage: 3.16 V
Brady Statistic RV Percent Paced: 0.01 %
Date Time Interrogation Session: 20210304110637
HighPow Impedance: 71 Ohm
Implantable Lead Implant Date: 20210222
Implantable Lead Location: 753860
Implantable Pulse Generator Implant Date: 20210222
Lead Channel Impedance Value: 342 Ohm
Lead Channel Impedance Value: 418 Ohm
Lead Channel Pacing Threshold Amplitude: 1 V
Lead Channel Pacing Threshold Pulse Width: 0.4 ms
Lead Channel Sensing Intrinsic Amplitude: 10.5 mV
Lead Channel Sensing Intrinsic Amplitude: 7.75 mV
Lead Channel Setting Pacing Amplitude: 3.5 V
Lead Channel Setting Pacing Pulse Width: 0.4 ms
Lead Channel Setting Sensing Sensitivity: 0.3 mV

## 2020-01-05 LAB — TYPE AND SCREEN
ABO/RH(D): O POS
Antibody Screen: NEGATIVE

## 2020-01-05 LAB — COMPREHENSIVE METABOLIC PANEL
ALT: 17 U/L (ref 0–44)
AST: 17 U/L (ref 15–41)
Albumin: 3.8 g/dL (ref 3.5–5.0)
Alkaline Phosphatase: 60 U/L (ref 38–126)
Anion gap: 10 (ref 5–15)
BUN: 18 mg/dL (ref 8–23)
CO2: 22 mmol/L (ref 22–32)
Calcium: 9.4 mg/dL (ref 8.9–10.3)
Chloride: 103 mmol/L (ref 98–111)
Creatinine, Ser: 1.18 mg/dL (ref 0.61–1.24)
GFR calc Af Amer: >60 mL/min (ref >60)
GFR calc non Af Amer: >60 mL/min (ref >60)
Glucose, Bld: 81 mg/dL (ref 70–99)
Potassium: 4.5 mmol/L (ref 3.5–5.1)
Sodium: 135 mmol/L (ref 135–145)
Total Bilirubin: 0.9 mg/dL (ref 0.3–1.2)
Total Protein: 7.2 g/dL (ref 6.5–8.1)

## 2020-01-05 LAB — PROTIME-INR
INR: 1.4 — ABNORMAL HIGH (ref 0.8–1.2)
Prothrombin Time: 16.8 seconds — ABNORMAL HIGH (ref 11.4–15.2)

## 2020-01-05 LAB — CBC WITH DIFFERENTIAL/PLATELET
Abs Immature Granulocytes: 0.01 10*3/uL (ref 0.00–0.07)
Basophils Absolute: 0 10*3/uL (ref 0.0–0.1)
Basophils Relative: 1 %
Eosinophils Absolute: 0.1 10*3/uL (ref 0.0–0.5)
Eosinophils Relative: 2 %
HCT: 50 % (ref 39.0–52.0)
Hemoglobin: 16.9 g/dL (ref 13.0–17.0)
Immature Granulocytes: 0 %
Lymphocytes Relative: 31 %
Lymphs Abs: 1.7 10*3/uL (ref 0.7–4.0)
MCH: 29.8 pg (ref 26.0–34.0)
MCHC: 33.8 g/dL (ref 30.0–36.0)
MCV: 88.2 fL (ref 80.0–100.0)
Monocytes Absolute: 0.7 10*3/uL (ref 0.1–1.0)
Monocytes Relative: 13 %
Neutro Abs: 2.9 10*3/uL (ref 1.7–7.7)
Neutrophils Relative %: 53 %
Platelets: 396 10*3/uL (ref 150–400)
RBC: 5.67 MIL/uL (ref 4.22–5.81)
RDW: 12.8 % (ref 11.5–15.5)
WBC: 5.4 10*3/uL (ref 4.0–10.5)
nRBC: 0 % (ref 0.0–0.2)

## 2020-01-05 LAB — APTT: aPTT: 39 seconds — ABNORMAL HIGH (ref 24–36)

## 2020-01-05 LAB — ABO/RH: ABO/RH(D): O POS

## 2020-01-05 LAB — DIGOXIN LEVEL: Digoxin Level: 0.5 ng/mL — ABNORMAL LOW (ref 0.8–2.0)

## 2020-01-05 MED ORDER — OXYCODONE-ACETAMINOPHEN 5-325 MG PO TABS: 1.0000 | ORAL_TABLET | ORAL | 0 refills | Status: DC | PRN

## 2020-01-05 MED ORDER — OXYCODONE-ACETAMINOPHEN 5-325 MG PO TABS
1.0000 | ORAL_TABLET | Freq: Once | ORAL | Status: AC
  Administered 2020-01-05: 1 via ORAL
  Filled 2020-01-05: qty 1

## 2020-01-05 MED ORDER — SODIUM CHLORIDE 0.9 % IV SOLN: INTRAVENOUS | Status: DC

## 2020-01-05 NOTE — Progress Notes (Signed)
Wound check appointment. Dermabond removed. Wound without redness or edema. Incision edges approximated, wound well healed. Normal device function. Thresholds, sensing, and impedances consistent with implant measurements. Device programmed at 3.5V for extra safety margin until 3 month visit. Histogram distribution appropriate for patient and level of activity. No mode switches or ventricular arrhythmias noted. Patient educated about wound care, arm mobility, lifting restrictions, shock plan. ROV in 3 months with Dr. Graciela Husbands. Re-iterated no driving with recent syncope.

## 2020-01-05 NOTE — ED Notes (Signed)
Patient verbalizes understanding of discharge instructions. Opportunity for questioning and answers were provided. Armband removed by staff, pt discharged from ED ambulatory to home.  

## 2020-01-05 NOTE — Discharge Instructions (Addendum)
Follow-up at the [facility] clinic

## 2020-01-05 NOTE — ED Provider Notes (Signed)
[facility] EMERGENCY DEPARTMENT Provider Note   CSN: [id number] Arrival date & time: 01/05/20  1722     History Chief Complaint  Patient presents with  . Rectal Bleeding    Brandon Frye is a 74 y.o. male.  74 year old male presents with recurrent rectal bleeding which has been going on for about a week.  Was seen at the [facility] clinic in [place] 2 days ago for similar symptoms and he has those records with him.  Patient was deemed stable for discharge at that time.  He does take Xarelto history of DVT.  Patient saw his doctor at the [place]   today and patient was found to have a blood pressure of 96/60.  He continues to have bright red blood per rectum when he wipes his bottom.Brandon Frye  He denies any fever or abdominal discomfort.  No hematemesis.  He denies any weakness with standing.  No syncope or near syncope.        Past Medical History:  Diagnosis Date  . Arthritis   . Chronic systolic CHF (congestive heart failure) (HCC)   . Non-ischemic cardiomyopathy (HCC) 2017   clean cath, EF 30%    Patient Active Problem List   Diagnosis Date Noted  . Acute on chronic combined systolic and diastolic CHF (congestive heart failure) (HCC)   . Cardiomyopathy (HCC) 12/22/2019  . COPD (chronic obstructive pulmonary disease) (HCC) 12/22/2019  . Hypertensive urgency 12/22/2019  . Lung nodule 12/22/2019  . Heart failure (HCC) 12/22/2019  . Arthritis   . Acute on chronic congestive heart failure (HCC)   . Syncope   . NSVT (nonsustained ventricular tachycardia) (HCC)     Past Surgical History:  Procedure Laterality Date  . CARDIAC CATHETERIZATION  2017   at [facility] in [place], clean, EF 30%  . ICD IMPLANT N/A 12/26/2019   Procedure: ICD IMPLANT;  Surgeon: Duke Salvia, MD;  Location: [facility] INVASIVE CV LAB;  Service: Cardiovascular;  Laterality: N/A;  . KNEE SURGERY    . RIGHT/LEFT HEART CATH AND CORONARY ANGIOGRAPHY N/A 12/23/2019   Procedure: RIGHT/LEFT HEART CATH AND  CORONARY ANGIOGRAPHY;  Surgeon: Laurey Morale, MD;  Location: [facility] INVASIVE CV LAB;  Service: Cardiovascular;  Laterality: N/A;       Family History  Problem Relation Age of Onset  . Stroke Mother        Multiple strokes, died at 43  . Heart attack Father 44    Social History   Tobacco Use  . Smoking status: Current Every Day Smoker    Packs/day: 1.00    Years: 25.00    Pack years: 25.00    Types: Cigarettes  . Smokeless tobacco: Never Used  Substance Use Topics  . Alcohol use: No  . Drug use: No    Home Medications Prior to Admission medications   Medication Sig Start Date End Date Taking? Authorizing Provider  acetaminophen (TYLENOL) 500 MG tablet Take 2 tablets (1,000 mg total) by mouth every 6 (six) hours as needed for mild pain, moderate pain or headache. 12/27/19   Liborio Nixon, MD  aspirin EC 81 MG tablet Take 81 mg by mouth daily.    [provider]  carvedilol (COREG) 12.5 MG tablet Take 1 tablet (12.5 mg total) by mouth 2 (two) times daily with a meal. 12/27/19   Liborio Nixon, MD  digoxin (LANOXIN) 0.125 MG tablet Take 1 tablet (0.125 mg total) by mouth daily. 12/28/19   Liborio Nixon, MD  furosemide (  LASIX) 20 MG tablet Take 1 tablet (20 mg total) by mouth daily. 12/28/19   Liborio Nixon, MD  Ipratropium-Albuterol (COMBIVENT RESPIMAT) 20-100 MCG/ACT AERS respimat Inhale 1 puff into the lungs every 6 (six) hours as needed for wheezing or shortness of breath (or coughing). 04/10/17   [name], MD  nitroGLYCERIN (NITROSTAT) 0.4 MG SL tablet Place 0.4 mg under the tongue every 5 (five) minutes as needed for chest pain.    [provider]  predniSONE (DELTASONE) 1 MG tablet Take 4 tablets (4 mg total) by mouth daily. 12/27/19   Liborio Nixon, MD  rivaroxaban (XARELTO) 10 MG TABS tablet Take 1 tablet (10 mg total) by mouth daily. Start 12/29/19 12/29/19   Liborio Nixon, MD  sacubitril-valsartan (ENTRESTO) 24-26 MG Take 1 tablet by mouth 2 (two) times  daily. 12/27/19   Liborio Nixon, MD  spironolactone (ALDACTONE) 25 MG tablet Take 1 tablet (25 mg total) by mouth daily. 12/28/19   Liborio Nixon, MD    Allergies    Patient has no known allergies.  Review of Systems   Review of Systems  All other systems reviewed and are negative.   Physical Exam Updated Vital Signs There were no vitals taken for this visit.  Physical Exam Vitals and nursing note reviewed.  Constitutional:      General: He is not in acute distress.    Appearance: Normal appearance. He is well-developed. He is not toxic-appearing.  HENT:     Head: Normocephalic and atraumatic.  Eyes:     General: Lids are normal.     Conjunctiva/sclera: Conjunctivae normal.     Pupils: Pupils are equal, round, and reactive to light.  Neck:     Thyroid: No thyroid mass.     Trachea: No tracheal deviation.  Cardiovascular:     Rate and Rhythm: Normal rate and regular rhythm.     Heart sounds: Normal heart sounds. No murmur. No gallop.   Pulmonary:     Effort: Pulmonary effort is normal. No respiratory distress.     Breath sounds: Normal breath sounds. No stridor. No decreased breath sounds, wheezing, rhonchi or rales.  Abdominal:     General: Bowel sounds are normal. There is no distension.     Palpations: Abdomen is soft.     Tenderness: There is no abdominal tenderness. There is no rebound.  Genitourinary:    Rectum: Tenderness, external hemorrhoid and internal hemorrhoid present.     Comments: Brown stool noted.  No blood appreciated Musculoskeletal:        General: No tenderness. Normal range of motion.     Cervical back: Normal range of motion and neck supple.  Skin:    General: Skin is warm and dry.     Findings: No abrasion or rash.  Neurological:     Mental Status: He is alert and oriented to person, place, and time.     GCS: GCS eye subscore is 4. GCS verbal subscore is 5. GCS motor subscore is 6.     Cranial Nerves: No cranial nerve deficit.     Sensory:  No sensory deficit.  Psychiatric:        Speech: Speech normal.        Behavior: Behavior normal.     ED Results / Procedures / Treatments   Labs (all labs ordered are listed, but only abnormal results are displayed) Labs Reviewed  CBC WITH DIFFERENTIAL/PLATELET  COMPREHENSIVE METABOLIC PANEL  PROTIME-INR  APTT  DIGOXIN  LEVEL  TYPE AND SCREEN    EKG None  Radiology CUP [facility] DEVICE CHECK  Result Date: 01/05/2020 Wound check appointment. Dermabond removed. Wound without redness or edema. Incision edges approximated, wound well healed. Normal device function. Thresholds, sensing, and impedances consistent with implant measurements. Device programmed at 3.5V for extra safety margin until 3 month visit. Histogram distribution appropriate for patient and level of activity. No mode switches or ventricular arrhythmias noted. Patient educated about wound care, arm mobility, lifting restrictions, shock plan. ROV in 3 months with Dr. Caryl Comes. Re-iterated no driving with recent syncope.   Procedures Procedures (including critical care time)  Medications Ordered in ED Medications  0.9 %  sodium chloride infusion (has no administration in time range)    ED Course  I have reviewed the triage vital signs and the nursing notes.  Pertinent labs & imaging results that were available during my care of the patient were reviewed by me and considered in my medical decision making (see chart for details).    MDM Rules/Calculators/A&P                      Patient's labs here are reassuring.  He has no evidence of active GI bleeding.  He does likely have some internal hemorrhoids.  Patient has been prescribed hydrocortisone suppositories, stool softeners, lidocaine with minimal relief.  Have instructed that the patient should follow-up with the gastroenterologist at the [facility] center Final Clinical Impression(s) / ED Diagnoses Final diagnoses:  None    Rx / DC Orders ED Discharge  Orders    None       Lacretia Leigh, MD 01/05/20 Patrecia Pour

## 2020-01-05 NOTE — ED Triage Notes (Signed)
BIB [facility] from [place] with Rectal Bleeding. Bleeding began about a week ago and has been getting worse.  Described as bright red with stools.  VSS with EMS.

## 2020-02-03 ENCOUNTER — Telehealth: Payer: Self-pay | Admitting: *Deleted

## 2020-02-03 NOTE — Telephone Encounter (Signed)
See message from [facility]

## 2020-02-03 NOTE — Telephone Encounter (Signed)
Please address xarelto.   His last dose was 02/02/20

## 2020-02-03 NOTE — Telephone Encounter (Signed)
   [facility] Pre-operative Risk Assessment    Request for surgical clearance:  1. What type of surgery is being performed? Fissurectomy/ sphincterotomy   2. When is this surgery scheduled? 02/06/20  3. What type of clearance is required (medical clearance vs. Pharmacy clearance to hold med vs. Both)? medical  4. Are there any medications that need to be held prior to surgery and how long?none  5. Practice name and name of physician performing surgery? David hiller md  6. What is your office phone number   [phone]   7.   What is your office fax number [phone]  8.   Anesthesia type (None, local, MAC, general) ? Not listed   Brandon Frye 02/03/2020, 7:51 AM  _________________________________________________________________   (provider comments below)

## 2020-02-03 NOTE — Telephone Encounter (Signed)
Spoke with Clydie Braun at [facility] Dept, provided DC fax number for form as it has not been received.

## 2020-02-03 NOTE — Telephone Encounter (Signed)
Since I am not in the office I will see if pre op callback can fill out

## 2020-02-03 NOTE — Telephone Encounter (Signed)
Megan from [facility] Pre Anesthesia Department is calling stating they faxed over a device recommendation form and they need it filled out and faxed over ASAP. Their office closes at 2:00 PM today and the patient's procedure is scheduled for Monday 02/06/20. The fax number to their office is [phone]. Please advise.

## 2020-02-03 NOTE — Telephone Encounter (Signed)
   Primary Cardiologist: Marca Ancona, MD  Chart reviewed as part of pre-operative protocol coverage. Patient was contacted 02/03/2020 in reference to pre-operative risk assessment for pending surgery as outlined below.  Brandon Frye was last seen on 2/223/21 by Dr. Shirlee Latch.  Since that day, Brandon Frye has done well with non ischemic cardiomyopathy with EF 16% and Medtronic ICD.   No shocks from device.  Denies chest pain or SOB.  He is on Xarelto for DVT.   He has recently had I&D of abscess and now for Fissurectomy/ sphincterotomy.   Pt is at moderate risk at least due to his cardiomyopathy.    Minimal CAD on cath.   Therefore, based on ACC/AHA guidelines, the patient would be at acceptable but moderate risk for the planned procedure without further cardiovascular testing.   I will route this recommendation to the requesting party via Epic fax function and remove from pre-op pool.  Please call with questions.  Brandon Boozer, NP 02/03/2020, 10:29 AM

## 2020-02-03 NOTE — Telephone Encounter (Signed)
Peri-op device management form completed and faxed back. Confirmation received.

## 2020-02-03 NOTE — Telephone Encounter (Signed)
I will send message to device clinic to [facility] Pre Anesthesia Dept about clearance for device.

## 2020-02-03 NOTE — Telephone Encounter (Signed)
Patient with diagnosis of DVT on Xarelto for anticoagulation.    Procedure: Fissurectomy/ sphincterotomy  Date of procedure: 02/06/20  Patient Xarelto is managed by the [facility]. Patient was only seen by our cardiology team inpatient. Will defer to managing provider.

## 2020-02-03 NOTE — Telephone Encounter (Signed)
This patient needs followup with me or NP/PA.

## 2020-02-03 NOTE — Telephone Encounter (Signed)
I have not seen a form come over. Device clinic may need to reach for form to be faxed over to device clinic.

## 2020-02-06 NOTE — Telephone Encounter (Signed)
Pending fu with app 4/15

## 2020-02-16 ENCOUNTER — Ambulatory Visit (HOSPITAL_COMMUNITY)
Admission: RE | Admit: 2020-02-16 | Discharge: 2020-02-16 | Disposition: A | Discharge status: Home / Self Care | Payer: Medicare Other | Source: Ambulatory Visit | Attending: Cardiology | Admitting: Cardiology

## 2020-02-16 ENCOUNTER — Encounter (HOSPITAL_COMMUNITY): Payer: Self-pay

## 2020-02-16 ENCOUNTER — Other Ambulatory Visit: Payer: Self-pay

## 2020-02-16 VITALS — BP 102/60 | HR 84 | Wt 176.4 lb

## 2020-02-16 DIAGNOSIS — J449 Chronic obstructive pulmonary disease, unspecified: Secondary | ICD-10-CM | POA: Insufficient documentation

## 2020-02-16 DIAGNOSIS — Z86718 Personal history of other venous thrombosis and embolism: Secondary | ICD-10-CM | POA: Diagnosis not present

## 2020-02-16 DIAGNOSIS — I110 Hypertensive heart disease with heart failure: Secondary | ICD-10-CM | POA: Insufficient documentation

## 2020-02-16 DIAGNOSIS — F17210 Nicotine dependence, cigarettes, uncomplicated: Secondary | ICD-10-CM | POA: Insufficient documentation

## 2020-02-16 DIAGNOSIS — M1990 Unspecified osteoarthritis, unspecified site: Secondary | ICD-10-CM | POA: Insufficient documentation

## 2020-02-16 DIAGNOSIS — I428 Other cardiomyopathies: Secondary | ICD-10-CM | POA: Insufficient documentation

## 2020-02-16 DIAGNOSIS — I472 Ventricular tachycardia: Secondary | ICD-10-CM | POA: Insufficient documentation

## 2020-02-16 DIAGNOSIS — Z8249 Family history of ischemic heart disease and other diseases of the circulatory system: Secondary | ICD-10-CM | POA: Insufficient documentation

## 2020-02-16 DIAGNOSIS — I5022 Chronic systolic (congestive) heart failure: Secondary | ICD-10-CM | POA: Diagnosis present

## 2020-02-16 DIAGNOSIS — Z79899 Other long term (current) drug therapy: Secondary | ICD-10-CM | POA: Diagnosis not present

## 2020-02-16 DIAGNOSIS — Z7982 Long term (current) use of aspirin: Secondary | ICD-10-CM | POA: Diagnosis not present

## 2020-02-16 DIAGNOSIS — I2510 Atherosclerotic heart disease of native coronary artery without angina pectoris: Secondary | ICD-10-CM | POA: Diagnosis not present

## 2020-02-16 DIAGNOSIS — Z7901 Long term (current) use of anticoagulants: Secondary | ICD-10-CM | POA: Insufficient documentation

## 2020-02-16 LAB — DIGOXIN LEVEL: Digoxin Level: 0.7 ng/mL — ABNORMAL LOW (ref 0.8–2.0)

## 2020-02-16 LAB — BASIC METABOLIC PANEL
Anion gap: 9 (ref 5–15)
BUN: 23 mg/dL (ref 8–23)
CO2: 25 mmol/L (ref 22–32)
Calcium: 9.1 mg/dL (ref 8.9–10.3)
Chloride: 101 mmol/L (ref 98–111)
Creatinine, Ser: 1.68 mg/dL — ABNORMAL HIGH (ref 0.61–1.24)
GFR calc Af Amer: 47 mL/min — ABNORMAL LOW (ref >60)
GFR calc non Af Amer: 40 mL/min — ABNORMAL LOW (ref >60)
Glucose, Bld: 118 mg/dL — ABNORMAL HIGH (ref 70–99)
Potassium: 4.9 mmol/L (ref 3.5–5.1)
Sodium: 135 mmol/L (ref 135–145)

## 2020-02-16 MED ORDER — RIVAROXABAN 10 MG PO TABS: 10.0000 mg | ORAL_TABLET | Freq: Every day | ORAL | 11 refills | Status: DC

## 2020-02-16 MED ORDER — DIGOXIN 125 MCG PO TABS: 0.1250 mg | ORAL_TABLET | Freq: Every day | ORAL | 11 refills | Status: DC

## 2020-02-16 MED ORDER — ENTRESTO 97-103 MG PO TABS: 1.0000 | ORAL_TABLET | Freq: Two times a day (BID) | ORAL | 11 refills | Status: DC

## 2020-02-16 MED ORDER — SPIRONOLACTONE 25 MG PO TABS: 25.0000 mg | ORAL_TABLET | Freq: Every day | ORAL | 11 refills | Status: DC

## 2020-02-16 MED ORDER — CARVEDILOL 12.5 MG PO TABS: 12.5000 mg | ORAL_TABLET | Freq: Two times a day (BID) | ORAL | 11 refills | Status: DC

## 2020-02-16 NOTE — Patient Instructions (Signed)
STOP Furosemide INCREASE Entresto to 97/103 mg, one tab twice daily  Labs today We will only contact you if something comes back abnormal or we need to make some changes. Otherwise no news is good news!  Your physician recommends that you schedule a follow-up appointment in: 3 weeks with Lauren PharmD  Your physician recommends that you schedule a follow-up appointment in: 8 weeks with Dr Shirlee Latch or  in the Advanced Practitioners (PA/NP) Clinic    Do the following things EVERYDAY: 1) Weigh yourself in the morning before breakfast. Write it down and keep it in a log. 2) Take your medicines as prescribed 3) Eat low salt foods--Limit salt (sodium) to 2000 mg per day.  4) Stay as active as you can everyday 5) Limit all fluids for the day to less than 2 liters   At the Advanced Heart Failure Clinic, you and your health needs are our priority. As part of our continuing mission to provide you with exceptional heart care, we have created designated Provider Care Teams. These Care Teams include your primary Cardiologist (physician) and Advanced Practice Providers (APPs- Physician Assistants and Nurse Practitioners) who all work together to provide you with the care you need, when you need it.   You may see any of the following providers on your designated Care Team at your next follow up: [name] Dr Arvilla Meres . Dr Marca Ancona . Tonye Becket, NP . Robbie Lis, PA . Karle Plumber, PharmD   Please be sure to bring in all your medications bottles to every appointment.

## 2020-02-16 NOTE — Progress Notes (Signed)
Advanced Heart Failure Clinic Note   Referring Physician: PCP: [facility] PCP-Cardiologist: Loralie Champagne, MD   HPI:  74 y/o AAM presenting to clinic today for post hospital f/u for chronic systolic HF and syncope.   Has a prior h/o DVT, COPD, and HTN.  He also had a nonischemic cardiomyopathy diagnosed in 2017 in [place], [place] was 30% with nonobstructive coronary disease on cath.    Patient now lives in [place] and gets care at the [place].  He was recently evaluated for syncope. He had  passed out in his house and woke up on the floor.  He was seen at the [place] and had an echo and a 2 week Zio patch. The echo showed EF 25-30% with bullseye strain pattern and dilated IVC.  The Zio patch showed around 40 episodes of NSVT, the longest was 9 seconds.  When the results returned, [facility].   On [facility] to [facility], he endorsed 2-3 weeks of orthopnea and exertional dyspnea, NYHA III symptoms. He denied chest pain and no further further syncopal episodes. HS-TnI in 20s range with no trend. On exam he was volume overloaded w/ elevated BP. He was admitted and treated w/ IV diuretics. HF meds adjusted. Entresto added. He was set up for [facility] which showed nonobstructive CAD, good filling pressures after diuresis, and low cardiac index at 1.8.   Cardiac MRI showed LV EF 16%, RVEF 31%, and non-coronary LGE pattern, primarily mid-wall septal involvement that could be consistent with prior myocarditis or sarcoidosis, less likely cardiac amyloidosis.  PYP scan negative.  CT chest w/o evidence for sarcoidosis and ACE level normal. Multiple myeloma panel was sent off but not resulted due to lab error.   EP was consulted and recommended ICD placement. Had Medtronic device implanted on 2/22 by Dr. Caryl Comes.   He presents to clinic for f/u. Doing well. No further dyspnea. Ambulating w/o difficulty. No LEE. Wt stable compared to discharge wt. No gain. Tolerating meds ok. BP a bit  soft but denies dizziness, no syncope/ near syncope. Takes lasix daily. Denies palpitations. No ICD shocks.      Review of Systems: [y] = yes, [ ]  = no   General: Weight gain [ ] ; Weight loss [ ] ; Anorexia [ ] ; Fatigue [ ] ; Fever [ ] ; Chills [ ] ; Weakness [ ]   Cardiac: Chest pain/pressure [ ] ; Resting SOB [ ] ; Exertional SOB [ ] ; Orthopnea [ ] ; Pedal Edema [ ] ; Palpitations [ ] ; Syncope [ ] ; Presyncope [ ] ; Paroxysmal nocturnal dyspnea[ ]   Pulmonary: Cough [ ] ; Wheezing[ ] ; Hemoptysis[ ] ; Sputum [ ] ; Snoring [ ]   GI: Vomiting[ ] ; Dysphagia[ ] ; Melena[ ] ; Hematochezia [ ] ; Heartburn[ ] ; Abdominal pain [ ] ; Constipation [ ] ; Diarrhea [ ] ; BRBPR [ ]   GU: Hematuria[ ] ; Dysuria [ ] ; Nocturia[ ]   Vascular: Pain in legs with walking [ ] ; Pain in feet with lying flat [ ] ; Non-healing sores [ ] ; Stroke [ ] ; TIA [ ] ; Slurred speech [ ] ;  Neuro: Headaches[ ] ; Vertigo[ ] ; Seizures[ ] ; Paresthesias[ ] ;Blurred vision [ ] ; Diplopia [ ] ; Vision changes [ ]   Ortho/Skin: Arthritis [ ] ; Joint pain [ ] ; Muscle pain [ ] ; Joint swelling [ ] ; Back Pain [ ] ; Rash [ ]   Psych: Depression[ ] ; Anxiety[ ]   Heme: Bleeding problems [ ] ; Clotting disorders [ ] ; Anemia [ ]   Endocrine: Diabetes [ ] ; Thyroid dysfunction[ ]    Past Medical History:  Diagnosis Date  . Arthritis   . Chronic systolic CHF (congestive heart failure) ([place])   . Non-ischemic cardiomyopathy ([place]) 2017   clean cath, EF 30%    Current Outpatient Medications  Medication Sig Dispense Refill  . acetaminophen (TYLENOL) 500 MG tablet Take 2 tablets (1,000 mg total) by mouth every 6 (six) hours as needed for mild pain, moderate pain or headache. 30 tablet 0  . albuterol (VENTOLIN HFA) 108 (90 Base) MCG/ACT inhaler Inhale 4 puffs into the lungs every 6 (six) hours as needed for wheezing or shortness of breath.    [name] aspirin EC 81 MG tablet Take 81 mg by mouth daily.    . carvedilol (COREG) 12.5 MG tablet Take 1 tablet (12.5 mg total) by mouth 2 (two) times  daily with a meal. 60 tablet 1  . dextromethorphan-guaiFENesin (MUCINEX DM) 30-600 MG 12hr tablet Take 1 tablet by mouth daily.    . digoxin (LANOXIN) 0.125 MG tablet Take 1 tablet (0.125 mg total) by mouth daily. 30 tablet 1  . furosemide (LASIX) 20 MG tablet Take 1 tablet (20 mg total) by mouth daily. 30 tablet 1  . Ipratropium-Albuterol (COMBIVENT RESPIMAT) 20-100 MCG/ACT AERS respimat Inhale 1 puff into the lungs every 6 (six) hours as needed for wheezing or shortness of breath (or coughing). 1 Inhaler 0  . nitroGLYCERIN (NITROSTAT) 0.4 MG SL tablet Place 0.4 mg under the tongue every 5 (five) minutes as needed for chest pain.    [name] oxyCODONE-acetaminophen (PERCOCET/ROXICET) 5-325 MG tablet Take 1-2 tablets by mouth every 4 (four) hours as needed for severe pain. 10 tablet 0  . predniSONE (DELTASONE) 1 MG tablet Take 4 tablets (4 mg total) by mouth daily. 120 tablet 0  . rivaroxaban (XARELTO) 10 MG TABS tablet Take 1 tablet (10 mg total) by mouth daily. Start 12/29/19 30 tablet 1  . sacubitril-valsartan (ENTRESTO) 49-51 MG Take 1 tablet by mouth 2 (two) times daily.    [name] spironolactone (ALDACTONE) 25 MG tablet Take 1 tablet (25 mg total) by mouth daily. 30 tablet 1   No current facility-administered medications for this encounter.    No Known Allergies    Social History   Socioeconomic History  . Marital status: Single    Spouse name: Not on file  . Number of children: Not on file  . Years of education: Not on file  . Highest education level: Not on file  Occupational History  . Not on file  Tobacco Use  . Smoking status: Current Every Day Smoker    Packs/day: 1.00    Years: 25.00    Pack years: 25.00    Types: Cigarettes  . Smokeless tobacco: Never Used  Substance and Sexual Activity  . Alcohol use: No  . Drug use: No  . Sexual activity: Not on file  Other Topics Concern  . Not on file  Social History Narrative  . Not on file   Social Determinants of Health    Financial Resource Strain:   . Difficulty of Paying Living Expenses:   Food Insecurity:   . Worried About Charity fundraiser in the Last Year:   . Arboriculturist in the Last Year:   Transportation Needs:   . Film/video editor (Medical):   [name] Lack of Transportation (Non-Medical):   Physical Activity:   . Days of Exercise per Week:   . Minutes of Exercise per Session:   Stress:   . Feeling of Stress :   Social  Connections:   . Frequency of Communication with Friends and Family:   . Frequency of Social Gatherings with Friends and Family:   . Attends Religious Services:   . Active Member of Clubs or Organizations:   . Attends Archivist Meetings:   [name] Marital Status:   Intimate Partner Violence:   . Fear of Current or Ex-Partner:   . Emotionally Abused:   [name] Physically Abused:   . Sexually Abused:       Family History  Problem Relation Age of Onset  . Stroke Mother        Multiple strokes, died at 91  . Heart attack Father 20    Vitals:   02/16/20 1515  BP: 102/60  Pulse: 84  SpO2: 98%  Weight: 80 kg     PHYSICAL EXAM: General:  Well appearing. No respiratory difficulty HEENT: normal Neck: supple. no JVD. Carotids 2+ bilat; no bruits. No lymphadenopathy or thyromegaly appreciated. Cor: PMI nondisplaced. Regular rate & rhythm. No rubs, gallops or murmurs. Lungs: clear Abdomen: soft, nontender, nondistended. No hepatosplenomegaly. No bruits or masses. Good bowel sounds. Extremities: no cyanosis, clubbing, rash, edema Neuro: alert & oriented x 3, cranial nerves grossly intact. moves all 4 extremities w/o difficulty. Affect pleasant.  ECG: not performed   ASSESSMENT & PLAN:  1. Chronic systolic CHF: Cath in 4628 showed nonobstructive CAD in setting of EF 30%. Echo in 2/21 at the [facility] showed EF 25-30% with bullseye pattern on strain imaging concerning for amyloidosis. No family history of cardiomyopathy but father had MI at age 26. Increased  exertional symptoms as well as orthopnea for 2-3 weeks, no inciting event. HS-TnI in 20s range with no trend. RHC/LHC showed nonobstructive CAD, good filling pressures after diuresis, and low cardiac index at 1.8.   Cardiac MRI showed LV EF 16%, RVEF 31%, and non-coronary LGE pattern, primarily mid-wall septal involvement that could be consistent with prior myocarditis or sarcoidosis, less likely cardiac amyloidosis.  PYP scan negative.  CT chest w/o evidence for sarcoidosis and ACE level normal. He is not volume overloaded on exam. Multiple myeloma panel was sent off but not resulted due to lab error.  - will resend multiple myeloma panel.  - volume status stable. Functional status improved, now NYHA II - Continue Corege 12.5 mg bid.  - Continue spironolactone 25 mg daily.  - Stop Lasix to allow BP room for Entresto titration. Can add back PRN in the future if needed. He will monitor wt daily at home.  - Increase Entresto to 97-103 bid.  - Continue digoxin 0.125 mg. Check dig level  2. Syncope: Multiple NSVT runs on 2 wk Zio patch, up to 9 seconds. He had a syncope episode in 2/21.No further VT, now off amiodarone. Now s/p MDT ICD. No further syncope/ near syncope. Denies palpitations   - He will stay off amiodarone for now - Continue  Coreg 12.5 mg bid 3. H/o DVT: on prophylactic dose of Xarelto, 10 mg daily.  Continue    F/u w/ Pharm D in 2-3 weeks for further med titration. Dr. Aundra Dubin in 6-8 weeks    Lyda Jester, PA-C 02/16/20

## 2020-02-17 ENCOUNTER — Telehealth (HOSPITAL_COMMUNITY): Payer: Self-pay | Admitting: Cardiology

## 2020-02-17 DIAGNOSIS — I429 Cardiomyopathy, unspecified: Secondary | ICD-10-CM

## 2020-02-17 MED ORDER — ENTRESTO 49-51 MG PO TABS: 1.0000 | ORAL_TABLET | Freq: Two times a day (BID) | ORAL | 6 refills | Status: DC

## 2020-02-17 NOTE — Telephone Encounter (Signed)
Pt aware and voiced understanding   Brandon Frye  02/16/2020 5:51 PM EDT    SCr has increased. He should stop Lasix but advise to stay on current dose of Entresto. DO NOT increase to 97-103. Continue on 49-51. He should monitor wt daily and only take Lasix as needed, if > 3 lb wt gain in 24 hrs or > 5 lb in 1 week. Repeat BMP in 7 days.

## 2020-02-17 NOTE — Telephone Encounter (Signed)
-----   Message from Allayne Butcher, [place] sent at 02/16/2020  5:51 PM EDT ----- SCr has increased. He should stop Lasix but advise to stay on current dose of Entresto. DO NOT increase to 97-103. Continue on 49-51. He should monitor wt daily and only take Lasix as needed, if > 3 lb wt gain in 24 hrs or > 5 lb in 1 week. Repeat BMP in 7 days.

## 2020-02-21 LAB — MULTIPLE MYELOMA PANEL, SERUM
Albumin SerPl Elph-Mcnc: 3.5 g/dL (ref 2.9–4.4)
Albumin/Glob SerPl: 1.1 (ref 0.7–1.7)
Alpha 1: 0.2 g/dL (ref 0.0–0.4)
Alpha2 Glob SerPl Elph-Mcnc: 0.7 g/dL (ref 0.4–1.0)
B-Globulin SerPl Elph-Mcnc: 1.1 g/dL (ref 0.7–1.3)
Gamma Glob SerPl Elph-Mcnc: 1.3 g/dL (ref 0.4–1.8)
Globulin, Total: 3.3 g/dL (ref 2.2–3.9)
IgA: 475 mg/dL — ABNORMAL HIGH (ref 61–437)
IgG (Immunoglobin G), Serum: 1381 mg/dL (ref 603–1613)
IgM (Immunoglobulin M), Srm: 176 mg/dL — ABNORMAL HIGH (ref 15–143)
Total Protein ELP: 6.8 g/dL (ref 6.0–8.5)

## 2020-02-24 ENCOUNTER — Other Ambulatory Visit: Payer: Self-pay

## 2020-02-24 ENCOUNTER — Ambulatory Visit (HOSPITAL_COMMUNITY)
Admission: RE | Admit: 2020-02-24 | Discharge: 2020-02-24 | Disposition: A | Discharge status: Home / Self Care | Payer: Medicare Other | Source: Ambulatory Visit | Attending: Internal Medicine | Admitting: Internal Medicine

## 2020-02-24 DIAGNOSIS — I429 Cardiomyopathy, unspecified: Secondary | ICD-10-CM

## 2020-02-24 LAB — BASIC METABOLIC PANEL
Anion gap: 7 (ref 5–15)
BUN: 22 mg/dL (ref 8–23)
CO2: 29 mmol/L (ref 22–32)
Calcium: 9.5 mg/dL (ref 8.9–10.3)
Chloride: 103 mmol/L (ref 98–111)
Creatinine, Ser: 1.32 mg/dL — ABNORMAL HIGH (ref 0.61–1.24)
GFR calc Af Amer: >60 mL/min (ref >60)
GFR calc non Af Amer: 54 mL/min — ABNORMAL LOW (ref >60)
Glucose, Bld: 87 mg/dL (ref 70–99)
Potassium: 5.4 mmol/L — ABNORMAL HIGH (ref 3.5–5.1)
Sodium: 139 mmol/L (ref 135–145)

## 2020-03-06 ENCOUNTER — Other Ambulatory Visit (HOSPITAL_COMMUNITY): Payer: Self-pay

## 2020-03-06 ENCOUNTER — Other Ambulatory Visit (HOSPITAL_COMMUNITY): Payer: Medicare Other

## 2020-03-06 DIAGNOSIS — I429 Cardiomyopathy, unspecified: Secondary | ICD-10-CM

## 2020-03-09 ENCOUNTER — Other Ambulatory Visit: Payer: Self-pay

## 2020-03-09 ENCOUNTER — Emergency Department (HOSPITAL_COMMUNITY)
Admission: EM | Admit: 2020-03-09 | Discharge: 2020-03-09 | Disposition: A | Discharge status: Home / Self Care | Payer: No Typology Code available for payment source | Attending: Emergency Medicine | Admitting: Emergency Medicine

## 2020-03-09 ENCOUNTER — Encounter (HOSPITAL_COMMUNITY): Payer: Self-pay

## 2020-03-09 DIAGNOSIS — I5042 Chronic combined systolic (congestive) and diastolic (congestive) heart failure: Secondary | ICD-10-CM | POA: Insufficient documentation

## 2020-03-09 DIAGNOSIS — J449 Chronic obstructive pulmonary disease, unspecified: Secondary | ICD-10-CM | POA: Insufficient documentation

## 2020-03-09 DIAGNOSIS — Z7901 Long term (current) use of anticoagulants: Secondary | ICD-10-CM | POA: Insufficient documentation

## 2020-03-09 DIAGNOSIS — K4090 Unilateral inguinal hernia, without obstruction or gangrene, not specified as recurrent: Secondary | ICD-10-CM

## 2020-03-09 DIAGNOSIS — Z79899 Other long term (current) drug therapy: Secondary | ICD-10-CM | POA: Insufficient documentation

## 2020-03-09 DIAGNOSIS — R1031 Right lower quadrant pain: Secondary | ICD-10-CM | POA: Diagnosis present

## 2020-03-09 DIAGNOSIS — F17210 Nicotine dependence, cigarettes, uncomplicated: Secondary | ICD-10-CM | POA: Diagnosis not present

## 2020-03-09 LAB — CBC
HCT: 44.9 % (ref 39.0–52.0)
Hemoglobin: 14.5 g/dL (ref 13.0–17.0)
MCH: 28.2 pg (ref 26.0–34.0)
MCHC: 32.3 g/dL (ref 30.0–36.0)
MCV: 87.2 fL (ref 80.0–100.0)
Platelets: 235 10*3/uL (ref 150–400)
RBC: 5.15 MIL/uL (ref 4.22–5.81)
RDW: 13.7 % (ref 11.5–15.5)
WBC: 5.6 10*3/uL (ref 4.0–10.5)
nRBC: 0.4 % — ABNORMAL HIGH (ref 0.0–0.2)

## 2020-03-09 LAB — COMPREHENSIVE METABOLIC PANEL
ALT: 12 U/L (ref 0–44)
AST: 14 U/L — ABNORMAL LOW (ref 15–41)
Albumin: 3.6 g/dL (ref 3.5–5.0)
Alkaline Phosphatase: 79 U/L (ref 38–126)
Anion gap: 8 (ref 5–15)
BUN: 23 mg/dL (ref 8–23)
CO2: 23 mmol/L (ref 22–32)
Calcium: 9.4 mg/dL (ref 8.9–10.3)
Chloride: 106 mmol/L (ref 98–111)
Creatinine, Ser: 1.81 mg/dL — ABNORMAL HIGH (ref 0.61–1.24)
GFR calc Af Amer: 43 mL/min — ABNORMAL LOW (ref >60)
GFR calc non Af Amer: 37 mL/min — ABNORMAL LOW (ref >60)
Glucose, Bld: 87 mg/dL (ref 70–99)
Potassium: 4.7 mmol/L (ref 3.5–5.1)
Sodium: 137 mmol/L (ref 135–145)
Total Bilirubin: 0.8 mg/dL (ref 0.3–1.2)
Total Protein: 7 g/dL (ref 6.5–8.1)

## 2020-03-09 LAB — LIPASE, BLOOD: Lipase: 34 U/L (ref 11–51)

## 2020-03-09 NOTE — ED Provider Notes (Signed)
[facility] EMERGENCY DEPARTMENT Provider Note   CSN: [id number] Arrival date & time: 03/09/20  1143     History Chief Complaint  Patient presents with  . Inguinal Hernia    Brandon Frye is a 74 y.o. male.  HPI Patient presents for increased pain and discomfort secondary to right sided inguinal hernia.  Patient first noticed hernia several months ago.  Over the past week, he has noticed it more.  Discomfort is exacerbated by bearing down and coughing.  On Tuesday (3 days ago), while he was driving from [place], he had increased pain and discomfort in that area.  Discomfort has been more persistent, occurring at any time that he standing.  Pain is relieved by laying supine.  Patient was seen at the [place] earlier today, where they had concerns about his hernia.  He was sent to [facility] for further evaluation.  Patient has been having normal bowel movements, the most recent of which was this morning.  He denies any nausea, vomiting, fevers or chills.    Past Medical History:  Diagnosis Date  . Arthritis   . Chronic systolic CHF (congestive heart failure) (HCC)   . Non-ischemic cardiomyopathy (HCC) 2017   clean cath, EF 30%    Patient Active Problem List   Diagnosis Date Noted  . Acute on chronic combined systolic and diastolic CHF (congestive heart failure) (HCC)   . Cardiomyopathy (HCC) 12/22/2019  . COPD (chronic obstructive pulmonary disease) (HCC) 12/22/2019  . Hypertensive urgency 12/22/2019  . Lung nodule 12/22/2019  . Heart failure (HCC) 12/22/2019  . Arthritis   . Acute on chronic congestive heart failure (HCC)   . Syncope   . NSVT (nonsustained ventricular tachycardia) (HCC)     Past Surgical History:  Procedure Laterality Date  . CARDIAC CATHETERIZATION  2017   at [facility] in [place], clean, EF 30%  . ICD IMPLANT N/A 12/26/2019   Procedure: ICD IMPLANT;  Surgeon: Duke Salvia, MD;  Location: [facility] INVASIVE CV LAB;  Service: Cardiovascular;   Laterality: N/A;  . KNEE SURGERY    . RIGHT/LEFT HEART CATH AND CORONARY ANGIOGRAPHY N/A 12/23/2019   Procedure: RIGHT/LEFT HEART CATH AND CORONARY ANGIOGRAPHY;  Surgeon: Laurey Morale, MD;  Location: [facility] INVASIVE CV LAB;  Service: Cardiovascular;  Laterality: N/A;       Family History  Problem Relation Age of Onset  . Stroke Mother        Multiple strokes, died at 25  . Heart attack Father 21    Social History   Tobacco Use  . Smoking status: Current Every Day Smoker    Packs/day: 1.00    Years: 25.00    Pack years: 25.00    Types: Cigarettes  . Smokeless tobacco: Never Used  Substance Use Topics  . Alcohol use: No  . Drug use: No    Home Medications Prior to Admission medications   Medication Sig Start Date End Date Taking? Authorizing Provider  acetaminophen (TYLENOL) 500 MG tablet Take 2 tablets (1,000 mg total) by mouth every 6 (six) hours as needed for mild pain, moderate pain or headache. 12/27/19   Liborio Nixon, MD  albuterol (VENTOLIN HFA) 108 (90 Base) MCG/ACT inhaler Inhale 4 puffs into the lungs every 6 (six) hours as needed for wheezing or shortness of breath.    [provider]  aspirin EC 81 MG tablet Take 81 mg by mouth daily.    [provider]  carvedilol (COREG) 12.5 MG tablet  Take 1 tablet (12.5 mg total) by mouth 2 (two) times daily with a meal. 02/16/20   Robbie Lis M, PA-C  dextromethorphan-guaiFENesin (MUCINEX DM) 30-600 MG 12hr tablet Take 1 tablet by mouth daily.    [provider]  digoxin (LANOXIN) 0.125 MG tablet Take 1 tablet (0.125 mg total) by mouth daily. 02/16/20   Robbie Lis M, PA-C  Ipratropium-Albuterol (COMBIVENT RESPIMAT) 20-100 MCG/ACT AERS respimat Inhale 1 puff into the lungs every 6 (six) hours as needed for wheezing or shortness of breath (or coughing). 04/10/17   [name], MD  nitroGLYCERIN (NITROSTAT) 0.4 MG SL tablet Place 0.4 mg under the tongue every 5 (five) minutes as needed for  chest pain.    [provider]  oxyCODONE-acetaminophen (PERCOCET/ROXICET) 5-325 MG tablet Take 1-2 tablets by mouth every 4 (four) hours as needed for severe pain. 01/05/20   Lorre Nick, MD  predniSONE (DELTASONE) 1 MG tablet Take 4 tablets (4 mg total) by mouth daily. 12/27/19   Liborio Nixon, MD  rivaroxaban (XARELTO) 10 MG TABS tablet Take 1 tablet (10 mg total) by mouth daily. Start 12/29/19 02/16/20   Robbie Lis M, PA-C  sacubitril-valsartan (ENTRESTO) 49-51 MG Take 1 tablet by mouth 2 (two) times daily. 02/17/20   Allayne Butcher, PA-C  spironolactone (ALDACTONE) 25 MG tablet Take 1 tablet (25 mg total) by mouth daily. 02/16/20   Allayne Butcher, PA-C    Allergies    Patient has no known allergies.  Review of Systems   Review of Systems  Constitutional: Negative.  Negative for activity change, appetite change, chills, diaphoresis, fatigue and fever.  HENT: Negative.  Negative for ear pain and sore throat.   Eyes: Negative.  Negative for pain and visual disturbance.  Respiratory: Negative.  Negative for cough and shortness of breath.   Cardiovascular: Negative.  Negative for chest pain and palpitations.  Gastrointestinal: Negative.  Negative for abdominal distention, abdominal pain, blood in stool, constipation, diarrhea, nausea and vomiting.  Genitourinary: Negative for dysuria, frequency, hematuria, penile pain, scrotal swelling and testicular pain.       Right-sided inguinal hernia  Musculoskeletal: Negative for arthralgias, back pain, gait problem, joint swelling, myalgias and neck pain.  Skin: Negative for color change and rash.  Neurological: Negative for dizziness, seizures, syncope, light-headedness and headaches.  Hematological: Does not bruise/bleed easily.  Psychiatric/Behavioral: Negative.   All other systems reviewed and are negative.   Physical Exam Updated Vital Signs BP 106/69   Pulse 87   Temp 98.1 F (36.7 C) (Oral)   Resp 14   Ht  6\' 1"  (1.854 m)   Wt 78.9 kg   SpO2 98%   BMI 22.96 kg/m   Physical Exam Vitals and nursing note reviewed.  Constitutional:      General: He is not in acute distress.    Appearance: Normal appearance. He is well-developed and normal weight. He is not ill-appearing, toxic-appearing or diaphoretic.  HENT:     Head: Normocephalic and atraumatic.     Right Ear: External ear normal.     Left Ear: External ear normal.     Nose: Nose normal.     Mouth/Throat:     Mouth: Mucous membranes are moist.     Pharynx: Oropharynx is clear.  Eyes:     Conjunctiva/sclera: Conjunctivae normal.  Cardiovascular:     Rate and Rhythm: Normal rate and regular rhythm.     Heart sounds: No murmur.  Pulmonary:     Effort: Pulmonary  effort is normal. No respiratory distress.     Breath sounds: Normal breath sounds. No wheezing or rales.  Abdominal:     General: Abdomen is flat. Bowel sounds are normal. There is no distension.     Palpations: Abdomen is soft.     Tenderness: There is no abdominal tenderness. There is no right CVA tenderness, left CVA tenderness or guarding.     Hernia: A hernia is present.  Musculoskeletal:        General: No swelling, tenderness or deformity. Normal range of motion.     Cervical back: Normal range of motion and neck supple. No rigidity.     Right lower leg: No edema.     Left lower leg: No edema.  Skin:    General: Skin is warm and dry.     Capillary Refill: Capillary refill takes less than 2 seconds.  Neurological:     General: No focal deficit present.     Mental Status: He is alert and oriented to person, place, and time.  Psychiatric:        Mood and Affect: Mood normal.        Behavior: Behavior normal.        Thought Content: Thought content normal.        Judgment: Judgment normal.     ED Results / Procedures / Treatments   Labs (all labs ordered are listed, but only abnormal results are displayed) Labs Reviewed  COMPREHENSIVE METABOLIC PANEL -  Abnormal; Notable for the following components:      Result Value   Creatinine, Ser 1.81 (*)    AST 14 (*)    GFR calc non Af Amer 37 (*)    GFR calc Af Amer 43 (*)    All other components within normal limits  CBC - Abnormal; Notable for the following components:   nRBC 0.4 (*)    All other components within normal limits  LIPASE, BLOOD    EKG None  Radiology No results found.  Procedures Procedures (including critical care time)  Medications Ordered in ED Medications - No data to display  ED Course  I have reviewed the triage vital signs and the nursing notes.  Pertinent labs & imaging results that were available during my care of the patient were reviewed by me and considered in my medical decision making (see chart for details).    MDM Rules/Calculators/A&P                      Patient is a 74 year old male who presents for worsening symptoms associated with right-sided, direct inguinal hernia.  Discomfort from hernia has become more frequent.  Patient has not had any overlying skin changes, nausea, vomiting, or fevers.  He has been having normal bowel movements as recently as this morning.  On exam, patient has what appears to be a right-sided direct inguinal hernia.  When laying supine, it is easily reducible.  Given the history, there is no concern of incarceration and/or strangulation.  No imaging is indicated at this time.  Patient does state that discomfort from hernia is affecting his quality of life.  He would like surgical repair.  He would prefer that operative management be done at [facility] facility, rather than the [place].  Patient was given contact information to set up outpatient appointment with surgical services.  He was also advised to return to the ED if hernia became irreducible, or if he experienced any severe pain and/or obstipation.  He was discharged in good condition.  Final Clinical Impression(s) / ED Diagnoses Final diagnoses:  Right inguinal  hernia    Rx / DC Orders ED Discharge Orders    None       Gloris Manchester, MD 03/10/20 0254    Gwyneth Sprout, MD 03/10/20 2344

## 2020-03-09 NOTE — ED Triage Notes (Signed)
Pt reports pain at right inguinal hernia site worsening over the past 2 weeks. Pt sent here by [facility] for further evaluation.

## 2020-03-12 ENCOUNTER — Ambulatory Visit (HOSPITAL_COMMUNITY)
Admission: RE | Admit: 2020-03-12 | Discharge: 2020-03-12 | Disposition: A | Discharge status: Home / Self Care | Payer: Medicare Other | Source: Ambulatory Visit | Attending: Internal Medicine | Admitting: Internal Medicine

## 2020-03-12 ENCOUNTER — Other Ambulatory Visit: Payer: Self-pay

## 2020-03-12 VITALS — BP 86/54 | HR 79 | Wt 174.6 lb

## 2020-03-12 DIAGNOSIS — Z86718 Personal history of other venous thrombosis and embolism: Secondary | ICD-10-CM | POA: Diagnosis not present

## 2020-03-12 DIAGNOSIS — R55 Syncope and collapse: Secondary | ICD-10-CM | POA: Diagnosis not present

## 2020-03-12 DIAGNOSIS — Z79899 Other long term (current) drug therapy: Secondary | ICD-10-CM | POA: Diagnosis not present

## 2020-03-12 DIAGNOSIS — I110 Hypertensive heart disease with heart failure: Secondary | ICD-10-CM | POA: Diagnosis not present

## 2020-03-12 DIAGNOSIS — J449 Chronic obstructive pulmonary disease, unspecified: Secondary | ICD-10-CM | POA: Insufficient documentation

## 2020-03-12 DIAGNOSIS — I5022 Chronic systolic (congestive) heart failure: Secondary | ICD-10-CM | POA: Diagnosis present

## 2020-03-12 DIAGNOSIS — I429 Cardiomyopathy, unspecified: Secondary | ICD-10-CM | POA: Diagnosis not present

## 2020-03-12 DIAGNOSIS — Z7901 Long term (current) use of anticoagulants: Secondary | ICD-10-CM | POA: Diagnosis not present

## 2020-03-12 LAB — BASIC METABOLIC PANEL
Anion gap: 13 (ref 5–15)
BUN: 32 mg/dL — ABNORMAL HIGH (ref 8–23)
CO2: 18 mmol/L — ABNORMAL LOW (ref 22–32)
Calcium: 9.1 mg/dL (ref 8.9–10.3)
Chloride: 107 mmol/L (ref 98–111)
Creatinine, Ser: 1.76 mg/dL — ABNORMAL HIGH (ref 0.61–1.24)
GFR calc Af Amer: 44 mL/min — ABNORMAL LOW (ref >60)
GFR calc non Af Amer: 38 mL/min — ABNORMAL LOW (ref >60)
Glucose, Bld: 99 mg/dL (ref 70–99)
Potassium: 5.1 mmol/L (ref 3.5–5.1)
Sodium: 138 mmol/L (ref 135–145)

## 2020-03-12 MED ORDER — FUROSEMIDE 20 MG PO TABS
20.0000 mg | ORAL_TABLET | Freq: Every day | ORAL | 3 refills | Status: DC | PRN
Start: 2020-03-12 — End: 2020-07-13

## 2020-03-12 NOTE — Progress Notes (Addendum)
PCP: Clinic, [facility] [place] PCP-Cardiologist: Loralie Champagne, MD   HPI:  74 y/o AAM presenting to clinic today for f/u for chronic systolic HF.  Has a prior h/o DVT, COPD, and HTN. He also had a nonischemic cardiomyopathy diagnosed in 2017 in [place], [place] was 30% with nonobstructive coronary disease on cath.   Patient now lives in [place] and gets care at the [place]. He was recently evaluated for syncope. He had passed out in his house and woke up on the floor. He was seen at the [place] and had an echo and a 2 week Zio patch. The echo showed EF 25-30% with bullseye strain pattern and dilated IVC. The Zio patch showed around 40 episodes of NSVT, the longest was 9 seconds. When the results returned, his [facility] told him to go to the closet ER.   On arrival to [facility], he endorsed 2-3 weeks of orthopnea and exertional dyspnea, NYHA III symptoms. He denied chest pain and no further further syncopal episodes. HS-TnI in 20s range with no trend. On exam he was volume overloaded w/ elevated BP. He was admitted and treated w/ IV diuretics. HF meds adjusted. Entresto added. He was set up for [facility] which showed nonobstructive CAD, good filling pressures after diuresis, and low cardiac index at 1.8. Cardiac MRI showed LV EF 16%, RVEF 31%, and non-coronary LGE pattern, primarily mid-wall septal involvement that could be consistent with prior myocarditis or sarcoidosis, less likely cardiac amyloidosis.PYP scan negative.CT chest w/o evidence for sarcoidosis and ACE level normal. Multiple myeloma panel was sent off but not resulted due to lab error.   EP was consulted and recommended ICD placement. Had Medtronic device implanted on 2/22 by Dr. Caryl Comes.   Today he returns to HF clinic for pharmacist medication titration. At last visit with Lyda Jester, PA on 02/16/20, his Delene Loll was increased to 97/103 mg BID and his furosemide was stopped. His myeloma panel was rechecked and resulted as negative. After his  lab work resulted the same day, he was then instructed to stay on his current dose of Entresto 49/51 mg BID due to elevated SCr (1.18>1.68) and only take furosemide PRN for edema. Repeat labs on 02/24/20 showed improved SCr to 1.32 but elevated K of 5.4. He then presented to the ED for a right inguinal hernia on 03/09/20 and labs showed SCr back up to 1.81 and K of 4.7.  He reports he is overall feeling well. He denies lightheadedness, dizziness, fatigue, chest pain, or palpitations. He states his breathing is good and denies shortness of breath. He is able to complete all ADLs and walks 2-3 times per day. His reported weights at home range from 168-170 lbs. He has not required any furosemide since his last clinic visit. No PND/orthopnea, JVD, or peripheral edema. He reports his appetite has declined over the last couple weeks and only eats about 2 meals per day.  BP on exam is low at 86/54 mmHg. Patient last took his medications around 6 am this morning. He reports he has stayed hydrated throughout the day. He again denies any dizziness or lightheadedness. He verbalizes he "vaguely remembers" instructions from last visit to disregard instructions to increase Entresto to 97/103 mg BID and to continue with Entresto 49/51 mg BID. He reports he now takes 3 medications twice per day. [place] and they sent both Entresto 49/51 mg BID AND 97/103 mg BID on 02/17/20. Concerned the patient is taking BOTH strengths of Entresto.   [name] Shortness of breath/dyspnea  on exertion? no  . Orthopnea/PND? no . Edema? no . Lightheadedness/dizziness? no . Daily weights at home? yes . Blood pressure/heart rate monitoring at home? No - no BP machine at home . Following low-sodium/fluid-restricted diet? yes  HF Medications: Furosemide 20 mg daily PRN for fluid Carvedilol 12.5 mg BID Entresto 49/51 mg BID Spironolactone 25 mg daily Digoxin 0.125 mg daily  Has the patient been experiencing any side effects to the  medications prescribed?  no  Does the patient have any problems obtaining medications due to transportation or finances?   No - prescriptions through the [facility] mail order  Understanding of regimen: poor Understanding of indications: fair Potential of compliance: good Patient understands to avoid NSAIDs. Patient understands to avoid decongestants.    Pertinent Lab Values (03/09/20): [name] Serum creatinine 1.81 (was 1.32 in clinic), BUN 23, Potassium 4.7, Sodium 137, Digoxin 0.7  . BMET for today pending  Vital Signs: . Weight: 174.6 lb (last clinic weight: 176 lb) . Blood pressure: 86/54 mmHg  . Heart rate: 79  Assessment: 1. Chronic systolic CHF: Cath in 8315 showed nonobstructive CAD in setting of EF 30%. Echo in 12/2019 at the [facility] showed EF 25-30% with bullseye pattern on strain imaging concerning for amyloidosis. No family history of cardiomyopathy but father had MI at age 6. Increased exertional symptoms as well as orthopnea for 2-3 weeks, no inciting event. HS-TnI in 20s range with no trend. RHC/LHC showed nonobstructive CAD, good filling pressures after diuresis, and low cardiac index at 1.8. Cardiac MRI showed LV EF 16%, RVEF 31%, and non-coronary LGE pattern, primarily mid-wall septal involvement that could be consistent with prior myocarditis or sarcoidosis, less likely cardiac amyloidosis.PYP scan negative.CT chest w/o evidence for sarcoidosis and ACE level normal. He is not volume overloaded on exam. Multiple myeloma panel was sent off but not resulted due to lab error. Repeat panel sent is negative. - Functional status improved, now NYHA II. Euvolemic on exam - Vitals: BP 86/54, HR 79 - Labs: BMET pending - Continue taking furosemide 20 mg PRN for fluid -Continue carvedilol 12.5 mg BID - Continue spironolactone 25 mg daily - Continue Entresto 49/51 mg BID. Stop taking Entresto 97/103 mg in addition to this. Repeat BMET during next visit. - Continue digoxin 0.125 mg  daily (level = 0.7 ng/mL on 02/16/20) - Consider adding SGLT2i during future visit once SCr stabilizes. Per VA formulary, only Vania Rea is approved.  2. Syncope: Multiple NSVT runs on 2 week Zio patch, up to 9 seconds. He had a syncope episode in 12/2019.No further VT, now off amiodarone. Now s/p MDT ICD.No further syncope/ near syncope. Denies palpitations   - He will stay off amiodarone for now - Continue carvedilol 12.5 mg BID  3. H/o DVT - Continue prophylactic dose of Xarelto 10 mg daily   Plan: 1) Medication changes: Based on clinical presentation, vital signs and recent labs will instruct patient to stop taking Entresto 97/103 mg BID and only take Entresto 49/51 mg BID (upon medication review, he was taking both simultaneously).  2) Labs: BMET pending 3) Follow-up: 04/12/20 with HF NP/PA  Vertis Kelch, PharmD, BCPS Heart Failure Clinic Pharmacist [phone]  Audry Riles, PharmD, BCPS, BCCP, CPP Heart Failure Clinic Pharmacist [phone]

## 2020-03-12 NOTE — Patient Instructions (Addendum)
It was a pleasure seeing you today!  MEDICATIONS: -Stop taking Entresto 97/103 mg tablets -ONLY take Entresto 49/51 mg 1 tablet twice daily -Call if you have questions about your medications.  LABS: -We will call you if your labs need attention.  NEXT APPOINTMENT: Return to clinic in 1 month with HF NP/PA. Please bring your medications with you to your next visit.   In general, to take care of your heart failure: -Limit your fluid intake to 2 Liters (half-gallon) per day.   -Limit your salt intake to ideally 2-3 grams (2000-3000 mg) per day. -Weigh yourself daily and record, and bring that "weight diary" to your next appointment.  (Weight gain of 2-3 pounds in 1 day typically means fluid weight.) -The medications for your heart are to help your heart and help you live longer.   -Please contact us before stopping any of your heart medications.  Call the clinic at [phone] with questions or to reschedule future appointments.

## 2020-03-19 ENCOUNTER — Other Ambulatory Visit (HOSPITAL_COMMUNITY): Payer: Self-pay | Admitting: *Deleted

## 2020-03-19 ENCOUNTER — Telehealth: Payer: Self-pay | Admitting: Internal Medicine

## 2020-03-19 ENCOUNTER — Telehealth (HOSPITAL_COMMUNITY): Payer: Self-pay | Admitting: *Deleted

## 2020-03-19 MED ORDER — CARVEDILOL 12.5 MG PO TABS: 12.5000 mg | ORAL_TABLET | Freq: Two times a day (BID) | ORAL | 3 refills | Status: DC

## 2020-03-19 MED ORDER — ENTRESTO 49-51 MG PO TABS: 1.0000 | ORAL_TABLET | Freq: Two times a day (BID) | ORAL | 3 refills | Status: AC

## 2020-03-19 MED ORDER — DIGOXIN 125 MCG PO TABS: 0.1250 mg | ORAL_TABLET | Freq: Every day | ORAL | 3 refills | Status: DC

## 2020-03-19 MED ORDER — RIVAROXABAN 10 MG PO TABS: 10.0000 mg | ORAL_TABLET | Freq: Every day | ORAL | 3 refills | Status: AC

## 2020-03-19 MED ORDER — SPIRONOLACTONE 25 MG PO TABS: 25.0000 mg | ORAL_TABLET | Freq: Every day | ORAL | 3 refills | Status: DC

## 2020-03-19 NOTE — Telephone Encounter (Signed)
Patient called stating he needs a letter of the procedure he performed to keep him alive. Attn Ronesha A. Attwater. Please call patient when it's ready for pick up.  He states he needs it ASAP.

## 2020-03-19 NOTE — Telephone Encounter (Signed)
Scripts for dig, coreg, spiro,xarelto, and entresto printed with office note and faxed to [place]

## 2020-03-20 NOTE — Telephone Encounter (Signed)
Re-routed to Mindi Junker, Charity fundraiser. Pt is scheduled for in-clinic f/u on 03/27/20.

## 2020-03-21 NOTE — Telephone Encounter (Signed)
Attempted phone call to pt and left voicemail to contact RN at [phone]. 

## 2020-03-26 DIAGNOSIS — Z95810 Presence of automatic (implantable) cardiac defibrillator: Secondary | ICD-10-CM | POA: Insufficient documentation

## 2020-03-27 ENCOUNTER — Other Ambulatory Visit: Payer: Self-pay

## 2020-03-27 ENCOUNTER — Ambulatory Visit (INDEPENDENT_AMBULATORY_CARE_PROVIDER_SITE_OTHER): Payer: Medicare Other | Admitting: Internal Medicine

## 2020-03-27 ENCOUNTER — Encounter: Payer: Self-pay | Admitting: Internal Medicine

## 2020-03-27 VITALS — BP 104/64 | HR 69 | Ht 73.0 in | Wt 177.6 lb

## 2020-03-27 DIAGNOSIS — Z95810 Presence of automatic (implantable) cardiac defibrillator: Secondary | ICD-10-CM

## 2020-03-27 DIAGNOSIS — I429 Cardiomyopathy, unspecified: Secondary | ICD-10-CM | POA: Diagnosis not present

## 2020-03-27 DIAGNOSIS — I509 Heart failure, unspecified: Secondary | ICD-10-CM | POA: Diagnosis not present

## 2020-03-27 NOTE — Telephone Encounter (Signed)
Letter completed for pt and given to him during OV with Dr Graciela Husbands today 03/27/2020.  See Communications.

## 2020-03-27 NOTE — Progress Notes (Signed)
Patient Care Team: [facility], [facility] as PCP - General Larey Dresser, MD as PCP - Cardiology (Cardiology)   HPI  Brandon Frye is a 74 y.o. male seen in follow-up for an ICD-Medtronic implanted for secondary 2/21 prevention for syncope in the context of no nonsustained ventricular tachycardia nonischemic cardiomyopathy.  DATE TEST EF   2/21 Echo   20-25 %   2/21 LHC    % No obstruc CAD         VTNS   The patient denies chest pain, shortness of breath, nocturnal dyspnea, orthopnea or peripheral edema.  There have been no palpitations, lightheadedness or syncope.     Records and Results Reviewed  Past Medical History:  Diagnosis Date  . Arthritis   . Chronic systolic CHF (congestive heart failure) ([place])   . Non-ischemic cardiomyopathy ([place]) 2017   clean cath, EF 30%    Past Surgical History:  Procedure Laterality Date  . CARDIAC CATHETERIZATION  2017   at [facility] in [place], clean, EF 30%  . ICD IMPLANT N/A 12/26/2019   Procedure: ICD IMPLANT;  Surgeon: Deboraha Sprang, MD;  Location: [facility] CV LAB;  Service: Cardiovascular;  Laterality: N/A;  . KNEE SURGERY    . RIGHT/LEFT HEART CATH AND CORONARY ANGIOGRAPHY N/A 12/23/2019   Procedure: RIGHT/LEFT HEART CATH AND CORONARY ANGIOGRAPHY;  Surgeon: Larey Dresser, MD;  Location: [facility] CV LAB;  Service: Cardiovascular;  Laterality: N/A;    Current Meds  Medication Sig  . acetaminophen (TYLENOL) 500 MG tablet Take 2 tablets (1,000 mg total) by mouth every 6 (six) hours as needed for mild pain, moderate pain or headache.  . albuterol (VENTOLIN HFA) 108 (90 Base) MCG/ACT inhaler Inhale 4 puffs into the lungs every 6 (six) hours as needed for wheezing or shortness of breath.  [name] aspirin EC 81 MG tablet Take 81 mg by mouth daily.  . carvedilol (COREG) 12.5 MG tablet Take 1 tablet (12.5 mg total) by mouth 2 (two) times daily with a meal.  . furosemide (LASIX) 20 MG tablet Take 1 tablet (20 mg total)  by mouth daily as needed for fluid.  . Ipratropium-Albuterol (COMBIVENT RESPIMAT) 20-100 MCG/ACT AERS respimat Inhale 1 puff into the lungs every 6 (six) hours as needed for wheezing or shortness of breath (or coughing).  . rivaroxaban (XARELTO) 10 MG TABS tablet Take 1 tablet (10 mg total) by mouth daily. Start 12/29/19  . sacubitril-valsartan (ENTRESTO) 49-51 MG Take 1 tablet by mouth 2 (two) times daily.  [name] spironolactone (ALDACTONE) 25 MG tablet Take 1 tablet (25 mg total) by mouth daily.    Allergies  Allergen Reactions  . Lisinopril Cough      Review of Systems negative except from HPI and PMH  Physical Exam BP 104/64   Pulse 69   Ht 6\' 1"  (1.854 m)   Wt 177 lb 9.6 oz (80.6 kg)   SpO2 98%   BMI 23.43 kg/m  Well developed and well nourished in no acute distress HENT normal E scleral and icterus clear Neck Supple JVP flat; carotids brisk and full Clear to ausculation Device pocket well healed; without hematoma or erythema.  There is no tethering  regular rate and rhythm, no murmurs gallops or rub Soft with active bowel sounds No clubbing cyanosis  Edema Alert and oriented, grossly normal motor and sensory function Skin Warm and Dry  ECG sinus at 69 Interval 16/11/39  Estimated Creatinine Clearance: 43.5 mL/min (A) (by C-G  formula based on SCr of 1.76 mg/dL (H)).   Assessment and  Plan  Nonischemic cardiomyopathy  Syncope  Ventricular tachycardia-nonsustained  ICD-Medtronic   No interval syncope  Euvolemic continue current meds  No symptomatic nonsustained VT  Current medicines are reviewed at length with the patient today .  The patient does not  have concerns regarding medicines.

## 2020-03-27 NOTE — Patient Instructions (Signed)
Medication Instructions:  Your physician recommends that you continue on your current medications as directed. Please refer to the Current Medication list given to you today.  Labwork: None ordered.  Testing/Procedures: None ordered.  Follow-Up: Your physician wants you to follow-up in: 9 months with Dr Klein You will receive a reminder letter in the mail two months in advance. If you don't receive a letter, please call our office to schedule the follow-up appointment.  Remote monitoring is used to monitor your Pacemaker of ICD from home. This monitoring reduces the number of office visits required to check your device to one time per year. It allows us to keep an eye on the functioning of your device to ensure it is working properly.  Any Other Special Instructions Will Be Listed Below (If Applicable).  If you need a refill on your cardiac medications before your next appointment, please call your pharmacy.   

## 2020-03-27 NOTE — Progress Notes (Signed)
Pt requests a letter stating date and [facility]  See Letter dated 03/27/2020 provided for pt.

## 2020-03-29 ENCOUNTER — Telehealth: Payer: Self-pay

## 2020-03-29 NOTE — Telephone Encounter (Signed)
Spoke with patient to remind of missed remote transmission 

## 2020-03-30 ENCOUNTER — Ambulatory Visit: Payer: Self-pay | Admitting: Surgery

## 2020-03-30 NOTE — H&P (Signed)
Surgical Evaluation  Chief Complaint: hernia  HPI: 74yo man with multiple medical problems who presents with chief complaint of right inguinal hernia. He presented to the [place] and then to the [facility] ER and incarceration event 3 weeks ago, though it had become reducible by the time the emergency room physician evaluated. He had noticed a hernia for several months prior to that but it has been reducible. Notes that it protrudes with straining, coughing or changing positions and causes pain when this happens. He does have episodes of coughing from time to time. He denies prior abdominal surgery or hernia surgery. He was essentially healthy for much of his life until this past year. 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 Cardiologist is Dr. Graciela Husbands.  He is on XARELTO.  History of tobacco abuse.  Allergies  Allergen Reactions  . Lisinopril Cough    Past Medical History:  Diagnosis Date  . Arthritis   . Chronic systolic CHF (congestive heart failure) (H[facility])   . Non-ischemic cardiomyopathy ([facility]) 2017   clean cath, EF 30%    Past Surgical History:  Procedure Laterality Date  . CARDIAC CATHETERIZATION  2017   at [facility] in [place], clean, EF 30%  . ICD IMPLANT N/A 12/26/2019   Procedure: ICD IMPLANT;  Surgeon: Duke Salvia, MD;  [facility]: [facility] INVASIVE CV LAB;  Service: Cardiovascular;  Laterality: N/A;  . KNEE SURGERY    . RIGHT/LEFT HEART CATH AND CORONARY ANGIOGRAPHY N/A 12/23/2019   Procedure: RIGHT/LEFT HEART CATH AND CORONARY ANGIOGRAPHY;  Surgeon: Laurey Morale, MD;  Location: [facility] INVASIVE CV LAB;  Service: Cardiovascular;  Laterality: N/A;    Family History  Problem Relation Age of Onset  . Stroke Mother        Multiple strokes, died at 24  . Heart attack Father 46    Social History   Socioeconomic History  . Marital status:  Single    Spouse name: Not on file  . Number of children: Not on file  . Years of education: Not on file  . Highest education level: Not on file  Occupational History  . Not on file  Tobacco Use  . Smoking status: Current Every Day Smoker    Packs/day: 1.00    Years: 25.00    Pack years: 25.00    Types: Cigarettes  . Smokeless tobacco: Never Used  Substance and Sexual Activity  . Alcohol use: No  . Drug use: No  . Sexual activity: Not on file  Other Topics Concern  . Not on file  Social History Narrative  . Not on file   Social Determinants of Health   Financial Resource Strain:   . Difficulty of Paying Living Expenses:   Food Insecurity:   . Worried About Programme researcher, broadcasting/film/video in the Last Year:   . Barista in the Last Year:   Transportation Needs:   . Freight forwarder (Medical):   [name] Lack of Transportation (Non-Medical):   Physical Activity:   . Days of Exercise per Week:   . Minutes of Exercise per Session:   Stress:   . Feeling of Stress :   Social Connections:   . Frequency of Communication with Friends and Family:   . Frequency of Social Gatherings with Friends and Family:   . Attends Religious Services:   . Active Member of Clubs or Organizations:   .  Attends Banker Meetings:   [name] Marital Status:     Current Outpatient Medications on File Prior to Visit  Medication Sig Dispense Refill  . acetaminophen (TYLENOL) 500 MG tablet Take 2 tablets (1,000 mg total) by mouth every 6 (six) hours as needed for mild pain, moderate pain or headache. 30 tablet 0  . albuterol (VENTOLIN HFA) 108 (90 Base) MCG/ACT inhaler Inhale 4 puffs into the lungs every 6 (six) hours as needed for wheezing or shortness of breath.    [name] aspirin EC 81 MG tablet Take 81 mg by mouth daily.    . carvedilol (COREG) 12.5 MG tablet Take 1 tablet (12.5 mg total) by mouth 2 (two) times daily with a meal. 180 tablet 3  . furosemide (LASIX) 20 MG tablet Take 1 tablet (20 mg  total) by mouth daily as needed for fluid. 90 tablet 3  . Ipratropium-Albuterol (COMBIVENT RESPIMAT) 20-100 MCG/ACT AERS respimat Inhale 1 puff into the lungs every 6 (six) hours as needed for wheezing or shortness of breath (or coughing). 1 Inhaler 0  . rivaroxaban (XARELTO) 10 MG TABS tablet Take 1 tablet (10 mg total) by mouth daily. Start 12/29/19 90 tablet 3  . sacubitril-valsartan (ENTRESTO) 49-51 MG Take 1 tablet by mouth 2 (two) times daily. 180 tablet 3  . spironolactone (ALDACTONE) 25 MG tablet Take 1 tablet (25 mg total) by mouth daily. 90 tablet 3   No current facility-administered medications on file prior to visit.    Review of Systems: a complete, 10pt review of systems was completed with pertinent positives and negatives as documented in the HPI  Physical Exam: Vitals  Weight: 176.13 lb Height: 72in Body Surface Area: 2.02 m Body Mass Index: 23.89 kg/m  Temp.: 97.9F  Pulse: 87 (Regular)  P.OX: 98% (Room air) BP: 102/62(Sitting, Left Arm, Standard)  Alert and well-appearing Unlabored respirations Abdomen soft and nontender. There is a reducible right inguinal hernia which is tender. No lower extremity edema   CBC Latest Ref Rng & Units 03/09/2020 01/05/2020 12/27/2019  WBC 4.0 - 10.5 K/uL 5.6 5.4 4.2  Hemoglobin 13.0 - 17.0 g/dL 96.2 22.9 17.4(H)  Hematocrit 39.0 - 52.0 % 44.9 50.0 51.0  Platelets 150 - 400 K/uL 235 396 209    CMP Latest Ref Rng & Units 03/12/2020 03/09/2020 02/24/2020  Glucose 70 - 99 mg/dL 99 87 87  BUN 8 - 23 mg/dL 79(G) 23 22  Creatinine 0.61 - 1.24 mg/dL 9.21(J) 9.41(D) 4.08(X)  Sodium 135 - 145 mmol/L 138 137 139  Potassium 3.5 - 5.1 mmol/L 5.1 4.7 5.4(H)  Chloride 98 - 111 mmol/L 107 106 103  CO2 22 - 32 mmol/L 18(L) 23 29  Calcium 8.9 - 10.3 mg/dL 9.1 9.4 9.5  Total Protein 6.5 - 8.1 g/dL - 7.0 -  Total Bilirubin 0.3 - 1.2 mg/dL - 0.8 -  Alkaline Phos 38 - 126 U/L - 79 -  AST 15 - 41 U/L - 14(L) -  ALT 0 - 44 U/L - 12 -     Lab Results  Component Value Date   INR 1.4 (H) 01/05/2020   INR 2.0 (H) 08/19/2019    Imaging: No results found.   A/P: RIGHT INGUINAL HERNIA (K40.90) Story: Increasingly symptomatic with pain and episode of incarceration. I recommended open repair with mesh. We discussed the relevant anatomy and we discussed the technique of the procedure. Discussed risks of bleeding, infection, pain, scarring, injury to structures in the area including nerves, blood vessels, bowel,  bladder, risk of chronic pain, hernia recurrence, risk of seroma or hematoma, urinary retention, and risks of general anesthesia including cardiovascular, pulmonary, and thromboembolic complications. Questions were answered. Patient wishes to proceed with scheduling. He will need cardiac clearance and to hold his Xarelto preoperatively.    Patient Active Problem List   Diagnosis Date Noted  . ICD (implantable cardioverter-defibrillator) in place 03/26/2020  . Acute on chronic combined systolic and diastolic CHF (congestive heart failure) ([place])   . Cardiomyopathy ([place]) 12/22/2019  . COPD (chronic obstructive pulmonary disease) ([place]) 12/22/2019  . Hypertensive urgency 12/22/2019  . Lung nodule 12/22/2019  . Heart failure ([place]) 12/22/2019  . Arthritis   . Acute on chronic congestive heart failure ([place])   . Syncope   . NSVT (nonsustained ventricular tachycardia) (HCC)        Romana Juniper, MD [facility], [place]  See AMION to contact appropriate on-call provider

## 2020-04-06 ENCOUNTER — Telehealth: Payer: Self-pay

## 2020-04-06 NOTE — Telephone Encounter (Signed)
   [facility] Pre-operative Risk Assessment    HEARTCARE STAFF: - Please ensure there is not already an duplicate clearance open for this procedure. - Under Visit Info/Reason for Call, type in Other and utilize the format Clearance MM/DD/YY or Clearance TBD. Do not use dashes or single digits. - If request is for dental extraction, please clarify the # of teeth to be extracted.  Request for surgical clearance:  1. What type of surgery is being performed? INGUINAL HERNIA REPAIR   2. When is this surgery scheduled? TBD   3. What type of clearance is required (medical clearance vs. Pharmacy clearance to hold med vs. Both)? BOTH  4. Are there any medications that need to be held prior to surgery and how long? XAREILTO & ASA PLEASE ADVISE  5. Practice name and name of physician performing surgery? [facility]   6. What is the office phone number? [phone]   7.   What is the office fax number? [phone]  8.   Anesthesia type (None, local, MAC, general) ? GENERAL    Jacinta Shoe 04/06/2020, 3:04 PM  _________________________________________________________________   (provider comments below)

## 2020-04-06 NOTE — Telephone Encounter (Signed)
   Primary Cardiologist:Dalton Shirlee Latch, MD  Chart reviewed as part of pre-operative protocol coverage.  Brandon Frye has an upcoming appt on 6/10 for follow up. Preop clearance can be addressed at this appt.   Pre-op covering staff: - Please contact requesting surgeon's office via preferred method (i.e, phone, fax) to inform them of need for appointment prior to surgery.  If applicable, this message will also be routed to pharmacy pool and/or primary cardiologist for input on holding anticoagulant/antiplatelet agent as requested below so that this information is available to the clearing provider at time of patient's appointment.   Laverda Page, NP  04/06/2020, 3:26 PM

## 2020-04-12 ENCOUNTER — Ambulatory Visit (HOSPITAL_COMMUNITY)
Admission: RE | Admit: 2020-04-12 | Discharge: 2020-04-12 | Disposition: A | Discharge status: Home / Self Care | Payer: Medicare Other | Source: Ambulatory Visit | Attending: Cardiology | Admitting: Cardiology

## 2020-04-12 ENCOUNTER — Other Ambulatory Visit: Payer: Self-pay

## 2020-04-12 ENCOUNTER — Encounter (HOSPITAL_COMMUNITY): Payer: Self-pay

## 2020-04-12 VITALS — BP 90/58 | HR 87 | Wt 177.2 lb

## 2020-04-12 DIAGNOSIS — M1990 Unspecified osteoarthritis, unspecified site: Secondary | ICD-10-CM | POA: Diagnosis not present

## 2020-04-12 DIAGNOSIS — F17210 Nicotine dependence, cigarettes, uncomplicated: Secondary | ICD-10-CM | POA: Insufficient documentation

## 2020-04-12 DIAGNOSIS — Z7982 Long term (current) use of aspirin: Secondary | ICD-10-CM | POA: Diagnosis not present

## 2020-04-12 DIAGNOSIS — I2510 Atherosclerotic heart disease of native coronary artery without angina pectoris: Secondary | ICD-10-CM | POA: Insufficient documentation

## 2020-04-12 DIAGNOSIS — I428 Other cardiomyopathies: Secondary | ICD-10-CM | POA: Diagnosis not present

## 2020-04-12 DIAGNOSIS — I110 Hypertensive heart disease with heart failure: Secondary | ICD-10-CM | POA: Diagnosis not present

## 2020-04-12 DIAGNOSIS — K4090 Unilateral inguinal hernia, without obstruction or gangrene, not specified as recurrent: Secondary | ICD-10-CM | POA: Insufficient documentation

## 2020-04-12 DIAGNOSIS — Z7901 Long term (current) use of anticoagulants: Secondary | ICD-10-CM | POA: Diagnosis not present

## 2020-04-12 DIAGNOSIS — I5022 Chronic systolic (congestive) heart failure: Secondary | ICD-10-CM | POA: Diagnosis not present

## 2020-04-12 DIAGNOSIS — Z79899 Other long term (current) drug therapy: Secondary | ICD-10-CM | POA: Insufficient documentation

## 2020-04-12 DIAGNOSIS — J449 Chronic obstructive pulmonary disease, unspecified: Secondary | ICD-10-CM | POA: Insufficient documentation

## 2020-04-12 DIAGNOSIS — I472 Ventricular tachycardia: Secondary | ICD-10-CM | POA: Insufficient documentation

## 2020-04-12 DIAGNOSIS — Z86718 Personal history of other venous thrombosis and embolism: Secondary | ICD-10-CM | POA: Diagnosis not present

## 2020-04-12 DIAGNOSIS — Z8249 Family history of ischemic heart disease and other diseases of the circulatory system: Secondary | ICD-10-CM | POA: Insufficient documentation

## 2020-04-12 LAB — CBC
HCT: 43 % (ref 39.0–52.0)
Hemoglobin: 14 g/dL (ref 13.0–17.0)
MCH: 28.9 pg (ref 26.0–34.0)
MCHC: 32.6 g/dL (ref 30.0–36.0)
MCV: 88.8 fL (ref 80.0–100.0)
Platelets: 191 10*3/uL (ref 150–400)
RBC: 4.84 MIL/uL (ref 4.22–5.81)
RDW: 15.1 % (ref 11.5–15.5)
WBC: 6.8 10*3/uL (ref 4.0–10.5)
nRBC: 0 % (ref 0.0–0.2)

## 2020-04-12 LAB — BASIC METABOLIC PANEL
Anion gap: 9 (ref 5–15)
BUN: 27 mg/dL — ABNORMAL HIGH (ref 8–23)
CO2: 22 mmol/L (ref 22–32)
Calcium: 9 mg/dL (ref 8.9–10.3)
Chloride: 105 mmol/L (ref 98–111)
Creatinine, Ser: 1.57 mg/dL — ABNORMAL HIGH (ref 0.61–1.24)
GFR calc Af Amer: 51 mL/min — ABNORMAL LOW (ref >60)
GFR calc non Af Amer: 44 mL/min — ABNORMAL LOW (ref >60)
Glucose, Bld: 93 mg/dL (ref 70–99)
Potassium: 4.8 mmol/L (ref 3.5–5.1)
Sodium: 136 mmol/L (ref 135–145)

## 2020-04-12 NOTE — Patient Instructions (Signed)
It was great to see you today! No medication changes are needed at this time.  Labs today We will only contact you if something comes back abnormal or we need to make some changes. Otherwise no news is good news!  Your physician recommends that you schedule a follow-up appointment in: 3 months with Dr McLean  Do the following things EVERYDAY: 1) Weigh yourself in the morning before breakfast. Write it down and keep it in a log. 2) Take your medicines as prescribed 3) Eat low salt foods--Limit salt (sodium) to 2000 mg per day.  4) Stay as active as you can everyday 5) Limit all fluids for the day to less than 2 liters  At the [facility], you and your health needs are our priority. As part of our continuing mission to provide you with exceptional heart care, we have created designated Provider Care Teams. These Care Teams include your primary Cardiologist (physician) and Advanced Practice Providers (APPs- Physician Assistants and Nurse Practitioners) who all work together to provide you with the care you need, when you need it.   You may see any of the following providers on your designated Care Team at your next follow up: . Dr Daniel Bensimhon . Dr Dalton McLean . Amy Clegg, NP . Brittainy Simmons, PA . Lauren Kemp, PharmD   Please be sure to bring in all your medications bottles to every appointment.     

## 2020-04-12 NOTE — Progress Notes (Addendum)
[facility] Note   Referring Physician: PCP: Clinic, Brandon Frye PCP-Cardiologist: Loralie Champagne, MD   HPI:  74 y/o AAM presenting to clinic today for chronic systolic heart failure and for surgical clearance prior to planned inguinal hernia repair.   Has a prior h/o DVT, COPD, and HTN.  He also had a nonischemic cardiomyopathy diagnosed in 2017 in [place], [place] was 30% with nonobstructive coronary disease on cath.    Patient now lives in [place] and gets care at the [place].  He was recently evaluated for syncope. He had  passed out in his house and woke up on the floor.  He was seen at the [place] and had an echo and a 2 week Zio patch. The echo showed EF 25-30% with bullseye strain pattern and dilated IVC.  The Zio patch showed around 40 episodes of NSVT, the longest was 9 seconds.  When the results returned, [facility].   On arrival to [facility], he endorsed 2-3 weeks of orthopnea and exertional dyspnea, NYHA III symptoms. He denied chest pain and no further further syncopal episodes. HS-TnI in 20s range with no trend. On exam he was volume overloaded w/ elevated BP. He was admitted and treated w/ IV diuretics. HF meds adjusted. Entresto added. He was set up for [facility] which showed nonobstructive CAD, good filling pressures after diuresis, and low cardiac index at 1.8.   Cardiac MRI showed LV EF 16%, RVEF 31%, and non-coronary LGE pattern, primarily mid-wall septal involvement that could be consistent with prior myocarditis or sarcoidosis, less likely cardiac amyloidosis.  PYP scan negative.  CT chest w/o evidence for sarcoidosis and ACE level normal. Multiple myeloma panel negative for M spike protein.   EP was consulted and recommended ICD placement. Had Medtronic device implanted on 2/22 by Dr. Caryl Comes.   He presents to clinic today for routine follow-up.  He is also needing surgical clearance to undergo right inguinal hernia repair.  He reports he  has been doing well from a cardiac standpoint.  He denies exertional dyspnea.  No chest pain.  He can ambulate a flight of stairs without symptoms.  He also denies weight gain.  No lower extremity edema, orthopnea or PND.  Denies palpitations.  No lightheadedness, no dizziness.  His blood pressure is soft in the 16W systolic which he reports is his usual baseline however he is completely asymptomatic.  Reports full compliance with medications.  He is on Xarelto at DVT prophylactic dose.  He denies abnormal bleeding.  No falls.   Review of Systems: [y] = yes, [ ]  = no   General: Weight gain [ ] ; Weight loss [ ] ; Anorexia [ ] ; Fatigue [ ] ; Fever [ ] ; Chills [ ] ; Weakness [ ]   Cardiac: Chest pain/pressure [ ] ; Resting SOB [ ] ; Exertional SOB [ ] ; Orthopnea [ ] ; Pedal Edema [ ] ; Palpitations [ ] ; Syncope [ ] ; Presyncope [ ] ; Paroxysmal nocturnal dyspnea[ ]   Pulmonary: Cough [ ] ; Wheezing[ ] ; Hemoptysis[ ] ; Sputum [ ] ; Snoring [ ]   GI: Vomiting[ ] ; Dysphagia[ ] ; Melena[ ] ; Hematochezia [ ] ; Heartburn[ ] ; Abdominal pain [ ] ; Constipation [ ] ; Diarrhea [ ] ; BRBPR [ ]   GU: Hematuria[ ] ; Dysuria [ ] ; Nocturia[ ]   Vascular: Pain in legs with walking [ ] ; Pain in feet with lying flat [ ] ; Non-healing sores [ ] ; Stroke [ ] ; TIA [ ] ; Slurred speech [ ] ;  Neuro: Headaches[ ] ; Vertigo[ ] ;  Seizures[ ] ; Paresthesias[ ] ;Blurred vision [ ] ; Diplopia [ ] ; Vision changes [ ]   Ortho/Skin: Arthritis [ ] ; Joint pain [ ] ; Muscle pain [ ] ; Joint swelling [ ] ; Back Pain [ ] ; Rash [ ]   Psych: Depression[ ] ; Anxiety[ ]   Heme: Bleeding problems [ ] ; Clotting disorders [ ] ; Anemia [ ]   Endocrine: Diabetes [ ] ; Thyroid dysfunction[ ]    Past Medical History:  Diagnosis Date   Arthritis    Chronic systolic CHF (congestive heart failure) (HCC)    Non-ischemic cardiomyopathy ([place]) 2017   clean cath, EF 30%    Current Outpatient Medications  Medication Sig Dispense Refill   albuterol (VENTOLIN HFA) 108 (90 Base) MCG/ACT  inhaler Inhale 4 puffs into the lungs every 6 (six) hours as needed for wheezing or shortness of breath.     aspirin EC 81 MG tablet Take 81 mg by mouth daily.     carvedilol (COREG) 12.5 MG tablet Take 1 tablet (12.5 mg total) by mouth 2 (two) times daily with a meal. 180 tablet 3   Ipratropium-Albuterol (COMBIVENT RESPIMAT) 20-100 MCG/ACT AERS respimat Inhale 1 puff into the lungs every 6 (six) hours as needed for wheezing or shortness of breath (or coughing). 1 Inhaler 0   rivaroxaban (XARELTO) 10 MG TABS tablet Take 1 tablet (10 mg total) by mouth daily. Start 12/29/19 90 tablet 3   sacubitril-valsartan (ENTRESTO) 49-51 MG Take 1 tablet by mouth 2 (two) times daily. 180 tablet 3   acetaminophen (TYLENOL) 500 MG tablet Take 2 tablets (1,000 mg total) by mouth every 6 (six) hours as needed for mild pain, moderate pain or headache. (Patient not taking: Reported on 04/12/2020) 30 tablet 0   furosemide (LASIX) 20 MG tablet Take 1 tablet (20 mg total) by mouth daily as needed for fluid. (Patient not taking: Reported on 04/12/2020) 90 tablet 3   spironolactone (ALDACTONE) 25 MG tablet Take 1 tablet (25 mg total) by mouth daily. (Patient not taking: Reported on 04/12/2020) 90 tablet 3   No current facility-administered medications for this encounter.    Allergies  Allergen Reactions   Lisinopril Cough      Social History   Socioeconomic History   Marital status: Single    Spouse name: Not on file   Number of children: Not on file   Years of education: Not on file   Highest education level: Not on file  Occupational History   Not on file  Tobacco Use   Smoking status: Current Every Day Smoker    Packs/day: 1.00    Years: 25.00    Pack years: 25.00    Types: Cigarettes   Smokeless tobacco: Never Used  Vaping Use   Vaping Use: Never used  Substance and Sexual Activity   Alcohol use: No   Drug use: No   Sexual activity: Not on file  Other Topics Concern   Not on  file  Social History Narrative   Not on file   Social Determinants of Health   Financial Resource Strain:    Difficulty of Paying Living Expenses:   Food Insecurity:    Worried About Charity fundraiser in the Last Year:    Arboriculturist in the Last Year:   Transportation Needs:    Film/video editor (Medical):    Lack of Transportation (Non-Medical):   Physical Activity:    Days of Exercise per Week:    Minutes of Exercise per Session:   Stress:    Feeling  of Stress :   Social Connections:    Frequency of Communication with Friends and Family:    Frequency of Social Gatherings with Friends and Family:    Attends Religious Services:    Active Member of Clubs or Organizations:    Attends Music therapist:    Marital Status:   Intimate Partner Violence:    Fear of Current or Ex-Partner:    Emotionally Abused:    Physically Abused:    Sexually Abused:       Family History  Problem Relation Age of Onset   Stroke Mother        Multiple strokes, died at 65   Heart attack Father 24    Vitals:   04/12/20 1400  BP: (!) 90/58  Pulse: 87  SpO2: 96%  Weight: 80.4 kg (177 lb 3.2 oz)    PHYSICAL EXAM: General:  Well appearing. No respiratory difficulty HEENT: normal Neck: supple. no JVD. Carotids 2+ bilat; no bruits. No lymphadenopathy or thyromegaly appreciated. Cor: PMI nondisplaced. Regular rate & rhythm. No rubs, gallops or murmurs. Lungs: clear Abdomen: soft, nontender, nondistended. No hepatosplenomegaly. No bruits or masses. Good bowel sounds. Extremities: no cyanosis, clubbing, rash, edema Neuro: alert & oriented x 3, cranial nerves grossly intact. moves all 4 extremities w/o difficulty. Affect pleasant.   ECG: Normal sinus rhythm, 82 bpm, no ischemic abnormalities  ASSESSMENT & PLAN:  1. Chronic systolic CHF: Cath in 7106 showed nonobstructive CAD in setting of EF 30%. Echo in 2/21 at the [facility] showed EF 25-30% with  bullseye pattern on strain imaging concerning for amyloidosis. No family history of cardiomyopathy but father had MI at age 80. Increased exertional symptoms as well as orthopnea for 2-3 weeks, no inciting event. HS-TnI in 20s range with no trend. RHC/LHC showed nonobstructive CAD, good filling pressures after diuresis, and low cardiac index at 1.8.   Cardiac MRI showed LV EF 16%, RVEF 31%, and non-coronary LGE pattern, primarily mid-wall septal involvement that could be consistent with prior myocarditis or sarcoidosis, less likely cardiac amyloidosis.  PYP scan negative.  CT chest w/o evidence for sarcoidosis and ACE level normal.  Multiple myeloma panel negative. - NYHA class II.  Euvolemic on exam. - Low BP limits titration of guideline directed medical therapy.  No med titration made today. - Continue Corege 12.5 mg bid.  - Continue spironolactone 25 mg daily.  - Continue Entresto 49-51 twice daily - Continue Lasix as needed  2. H/O Syncope: Multiple NSVT runs on 2 wk Zio patch, up to 9 seconds. He had a syncope episode in 2/21.No further VT, now off amiodarone. Now s/p MDT ICD. No further syncope/ near syncope. Denies palpitations   - He will stay off amiodarone for now - Continue  Coreg 12.5 mg bid 3. H/o DVT: on prophylactic dose of Xarelto, 10 mg daily.   Okay to hold perioperatively for inguinal hernia repair.  Hold 48 hours prior to procedure.  Resume as soon as safe to do so from a surgical standpoint 4.  Surgical clearance: He is needing to undergo outpatient surgery for right inguinal hernia repair.  He denies any anginal symptomatology.  EKG nonischemic.  He is able to complete > 4 METS of physical activity without exertional chest pain or dyspnea.  He is stable from a heart failure standpoint.  He can be cleared to undergo surgery without additional cardiac testing.  He is of  Risk. Ok to hold ASA if needed. I will route clearance to  [facility]    F/u in 2-3 months     [place], [place] 04/12/20

## 2020-05-03 ENCOUNTER — Encounter (HOSPITAL_COMMUNITY): Payer: Self-pay

## 2020-05-03 ENCOUNTER — Encounter: Payer: Self-pay | Admitting: Internal Medicine

## 2020-05-03 NOTE — Progress Notes (Signed)
PERIOPERATIVE PRESCRIPTION FOR IMPLANTED CARDIAC DEVICE PROGRAMMING  Patient Information: Name:  Brandon Frye  DOB:  1945/12/24  MRN:  [id number]    Altamese Arabi, RN  P Cv Div Heartcare Device Planned Procedure: open right inguinal hernia repair  Surgeon: Phylliss Blakes  Date of Procedure: 05-16-2020  Cautery will be used.  Position during surgery: n/a   Please send documentation back to:  [facility] (Fax # [phone])   Altamese [place], RN  05/03/2020 8:50 AM   Device Information:  Clinic EP Physician:  Sherryl Manges, MD   Device Type:  Defibrillator Manufacturer and Phone #:  Medtronic: [id number] Pacemaker Dependent?:  No. Date of Last Device Check:  03/27/2020 Normal Device Function?:  Yes.    Electrophysiologist's Recommendations:   Have magnet available.  Provide continuous ECG monitoring when magnet is used or reprogramming is to be performed.   Procedure should not interfere with device function.  No device programming or magnet placement needed.  Per Device Clinic Standing Orders, Linton Ham, RN  10:03 AM 05/03/2020

## 2020-05-03 NOTE — Progress Notes (Addendum)
PCP - Dr. Joseph Art [facility] Cardiologist - Dr. Elly Modena clearance 04-12-20 epic  PPM/ICD - ICD, Medtronic Dr. Graciela Husbands Device Orders - on chart  Rep Notified - YES  Chest x-ray - 12-27-19 epic EKG - 04-12-20 epic Stress Test -  ECHO -  Cardiac Cath - 12-23-19 epic  Sleep Study -  CPAP -   Fasting Blood Sugar -  Checks Blood Sugar _____ times a day  Blood Thinner Instructions:Xarelto hold 48 hours prior to surgery Aspirin Instructions:  ERAS Protcol - PRE-SURGERY Ensure or G2-   COVID TEST- 05-12-20  Activity -able to walk to mailbox and climb a flight of stairs without SOB  Anesthesia review: DVT, COPD, CHF, HTN, ICD, nonobstructive CAD, K 6.6 at preop, creatine 1.64  Patient denies shortness of breath, fever, cough and chest pain at PAT appointment tNONE  All instructions explained to the patient, with a verbal understanding of the material. Patient agrees to go over the instructions while at home for a better understanding. Patient also instructed to self quarantine after being tested for COVID-19. The opportunity to ask questions was provided.

## 2020-05-03 NOTE — Telephone Encounter (Signed)
-----   Message from Altamese Corn, RN sent at 05/03/2020  8:50 AM EDT ----- Regarding: device orders Planned Procedure:  open right inguinal hernia repair Surgeon:  Phylliss Blakes Date of Procedure:  05-16-2020 Cautery will be used. Position during surgery:  n/a  Please send documentation back to: [facility] (Fax # [phone])  Altamese [place], RN  05/03/2020 8:50 AM

## 2020-05-03 NOTE — Patient Instructions (Signed)
DUE TO COVID-19 ONLY ONE VISITOR IS ALLOWED TO COME WITH YOU AND STAY IN THE WAITING ROOM ONLY DURING PRE OP AND PROCEDURE DAY OF SURGERY. TWO VISITOR MAY VISIT WITH YOU AFTER SURGERY IN YOUR PRIVATE ROOM DURING VISITING HOURS ONLY!  YOU NEED TO HAVE A COVID 19 TEST ON_7-10-21______ @_______ , THIS TEST MUST BE DONE BEFORE SURGERY, COME  [address], [place] , .  [facility]) ONCE YOUR COVID TEST IS COMPLETED, PLEASE BEGIN THE QUARANTINE INSTRUCTIONS AS OUTLINED IN YOUR HANDOUT.                Brandon Frye  05/03/2020   Your procedure is scheduled on: 05-16-20   Report to [facility] Main  Entrance   Report to admitting at        0630  AM     Call this number if you have problems the morning of surgery [phone]    Remember: Do not eat food or drink liquids :After Midnight.   BRUSH YOUR TEETH MORNING OF SURGERY AND RINSE YOUR MOUTH OUT, NO CHEWING GUM CANDY OR MINTS.     Take these medicines the morning of surgery with A SIP OF WATER: carvedilol, inhaler and bring with you                                 You may not have any metal on your body including hair pins and              piercings  Do not wear jewelry,  lotions, powders or perfumes, deodorant     .              Men may shave face and neck.   Do not bring valuables to the hospital. [facility] IS NOT             RESPONSIBLE   FOR VALUABLES.  Contacts, dentures or bridgework may not be worn into surgery.      Patients discharged the day of surgery will not be allowed to drive home. IF YOU ARE HAVING SURGERY AND GOING HOME THE SAME DAY, YOU MUST HAVE AN ADULT TO DRIVE YOU HOME AND BE WITH YOU FOR 24 HOURS. YOU MAY GO HOME BY TAXI OR UBER OR ORTHERWISE, BUT AN ADULT MUST ACCOMPANY YOU HOME AND STAY WITH YOU FOR 24 HOURS.  Name and phone number of your driver:  Special Instructions: N/A              Please read over the following fact sheets you were  given: _____________________________________________________________________             [facility] - Preparing for Surgery Before surgery, you can play an important role.  Because skin is not sterile, your skin needs to be as free of germs as possible.  You can reduce the number of germs on your skin by washing with CHG (chlorahexidine gluconate) soap before surgery.  CHG is an antiseptic cleaner which kills germs and bonds with the skin to continue killing germs even after washing. Please DO NOT use if you have an allergy to CHG or antibacterial soaps.  If your skin becomes reddened/irritated stop using the CHG and inform your nurse when you arrive at Short Stay. Do not shave (including legs and underarms) for at least 48 hours prior to the first CHG shower.  You may shave your face/neck. Please follow these  instructions carefully:  1.  Shower with CHG Soap the night before surgery and the  morning of Surgery.  2.  If you choose to wash your hair, wash your hair first as usual with your  normal  shampoo.  3.  After you shampoo, rinse your hair and body thoroughly to remove the  shampoo.                           4.  Use CHG as you would any other liquid soap.  You can apply chg directly  to the skin and wash                       Gently with a scrungie or clean washcloth.  5.  Apply the CHG Soap to your body ONLY FROM THE NECK DOWN.   Do not use on face/ open                           Wound or open sores. Avoid contact with eyes, ears mouth and genitals (private parts).                       Wash face,  Genitals (private parts) with your normal soap.             6.  Wash thoroughly, paying special attention to the area where your surgery  will be performed.  7.  Thoroughly rinse your body with warm water from the neck down.  8.  DO NOT shower/wash with your normal soap after using and rinsing off  the CHG Soap.                9.  Pat yourself dry with a clean towel.            10.  Wear  clean pajamas.            11.  Place clean sheets on your bed the night of your first shower and do not  sleep with pets. Day of Surgery : Do not apply any lotions/deodorants the morning of surgery.  Please wear clean clothes to the hospital/surgery center.  FAILURE TO FOLLOW THESE INSTRUCTIONS MAY RESULT IN THE CANCELLATION OF YOUR SURGERY PATIENT SIGNATURE_________________________________  NURSE SIGNATURE__________________________________  ________________________________________________________________________

## 2020-05-03 NOTE — Telephone Encounter (Signed)
This encounter was created in error - please disregard.

## 2020-05-08 ENCOUNTER — Encounter (HOSPITAL_COMMUNITY): Payer: Self-pay

## 2020-05-08 ENCOUNTER — Encounter (HOSPITAL_COMMUNITY): Payer: Self-pay | Admitting: Physician Assistant

## 2020-05-08 ENCOUNTER — Encounter (HOSPITAL_COMMUNITY)
Admission: RE | Admit: 2020-05-08 | Discharge: 2020-05-08 | Disposition: A | Discharge status: Home / Self Care | Payer: No Typology Code available for payment source | Source: Ambulatory Visit | Attending: Surgery | Admitting: Surgery

## 2020-05-08 ENCOUNTER — Other Ambulatory Visit: Payer: Self-pay

## 2020-05-08 DIAGNOSIS — Z01810 Encounter for preprocedural cardiovascular examination: Secondary | ICD-10-CM

## 2020-05-08 DIAGNOSIS — Z01812 Encounter for preprocedural laboratory examination: Secondary | ICD-10-CM | POA: Diagnosis not present

## 2020-05-08 HISTORY — DX: Presence of automatic (implantable) cardiac defibrillator: Z95.810

## 2020-05-08 HISTORY — DX: Acute embolism and thrombosis of unspecified deep veins of unspecified lower extremity: I82.409

## 2020-05-08 LAB — CBC WITH DIFFERENTIAL/PLATELET
Abs Immature Granulocytes: 0.01 10*3/uL (ref 0.00–0.07)
Basophils Absolute: 0 10*3/uL (ref 0.0–0.1)
Basophils Relative: 1 %
Eosinophils Absolute: 0.2 10*3/uL (ref 0.0–0.5)
Eosinophils Relative: 4 %
HCT: 52.4 % — ABNORMAL HIGH (ref 39.0–52.0)
Hemoglobin: 17 g/dL (ref 13.0–17.0)
Immature Granulocytes: 0 %
Lymphocytes Relative: 33 %
Lymphs Abs: 1.6 10*3/uL (ref 0.7–4.0)
MCH: 28.9 pg (ref 26.0–34.0)
MCHC: 32.4 g/dL (ref 30.0–36.0)
MCV: 89 fL (ref 80.0–100.0)
Monocytes Absolute: 0.7 10*3/uL (ref 0.1–1.0)
Monocytes Relative: 14 %
Neutro Abs: 2.2 10*3/uL (ref 1.7–7.7)
Neutrophils Relative %: 48 %
Platelets: 224 10*3/uL (ref 150–400)
RBC: 5.89 MIL/uL — ABNORMAL HIGH (ref 4.22–5.81)
RDW: 14.8 % (ref 11.5–15.5)
WBC: 4.7 10*3/uL (ref 4.0–10.5)
nRBC: 0 % (ref 0.0–0.2)

## 2020-05-08 LAB — BASIC METABOLIC PANEL
Anion gap: 9 (ref 5–15)
BUN: 45 mg/dL — ABNORMAL HIGH (ref 8–23)
CO2: 19 mmol/L — ABNORMAL LOW (ref 22–32)
Calcium: 9.4 mg/dL (ref 8.9–10.3)
Chloride: 107 mmol/L (ref 98–111)
Creatinine, Ser: 1.64 mg/dL — ABNORMAL HIGH (ref 0.61–1.24)
GFR calc Af Amer: 48 mL/min — ABNORMAL LOW (ref >60)
GFR calc non Af Amer: 41 mL/min — ABNORMAL LOW (ref >60)
Glucose, Bld: 85 mg/dL (ref 70–99)
Potassium: 6.6 mmol/L (ref 3.5–5.1)
Sodium: 135 mmol/L (ref 135–145)

## 2020-05-08 NOTE — Progress Notes (Signed)
CRITICAL VALUE ALERT  Critical Value: K+ 6.6  Date & Time Notied:  05-08-20 0940  Provider Notified: Jodell Cipro PA-C  Orders Received/Actions taken:

## 2020-05-12 ENCOUNTER — Inpatient Hospital Stay (HOSPITAL_COMMUNITY): Admission: RE | Admit: 2020-05-12 | Payer: Medicare Other | Source: Ambulatory Visit

## 2020-05-16 ENCOUNTER — Ambulatory Visit (HOSPITAL_COMMUNITY)
Admission: RE | Admit: 2020-05-16 | Payer: No Typology Code available for payment source | Source: Home / Self Care | Admitting: Surgery

## 2020-05-16 ENCOUNTER — Encounter (HOSPITAL_COMMUNITY): Admission: RE | Payer: Self-pay | Source: Home / Self Care

## 2020-05-16 SURGERY — REPAIR, HERNIA, INGUINAL, ADULT
Anesthesia: General | Laterality: Right

## 2020-06-04 ENCOUNTER — Encounter: Payer: Self-pay | Admitting: Internal Medicine

## 2020-06-04 ENCOUNTER — Telehealth: Payer: Self-pay | Admitting: *Deleted

## 2020-06-04 ENCOUNTER — Ambulatory Visit (INDEPENDENT_AMBULATORY_CARE_PROVIDER_SITE_OTHER): Payer: Medicare Other | Admitting: *Deleted

## 2020-06-04 DIAGNOSIS — I5023 Acute on chronic systolic (congestive) heart failure: Secondary | ICD-10-CM

## 2020-06-04 DIAGNOSIS — I429 Cardiomyopathy, unspecified: Secondary | ICD-10-CM

## 2020-06-04 NOTE — Patient Instructions (Addendum)
DUE TO COVID-19 ONLY ONE VISITOR IS ALLOWED TO COME WITH YOU AND STAY IN THE WAITING ROOM ONLY DURING PRE OP AND PROCEDURE DAY OF SURGERY. THE 2 VISITORS MAY VISIT WITH YOU AFTER SURGERY IN YOUR PRIVATE ROOM DURING VISITING HOURS ONLY!  YOU NEED TO HAVE A COVID 19 TEST ON_8/5______ @_9 :00______, THIS TEST MUST BE DONE BEFORE SURGER COVID TESTING SITE [address] [place] , IT IS ON THE RIGHT GOING OUT [address] 2 MINUTES PAST [employer] ON THE RIGHT. ONCE YOUR COVID TEST IS COMPLETED,  PLEASE BEGIN THE QUARANTINE INSTRUCTIONS AS OUTLINED IN YOUR HANDOUT.                Johnhenry Tippin    Your procedure is scheduled on: 06/11/20   Report to [facility] Main  Entrance   Report to admitting at  9:30 AM     Call this number if you have problems the morning of surgery [phone]    Remember: Do not eat food or drink liquids :After Midnight.   BRUSH YOUR TEETH MORNING OF SURGERY AND RINSE YOUR MOUTH OUT, NO CHEWING GUM CANDY OR MINTS.     Take these medicines the morning of surgery with A SIP OF WATER: Coreg, Entresto. Use your inhaler and bring it with you to the hospital                                 You may not have any metal on your body including hair pins and              piercings  Do not wear jewelry,  lotions, powders or deodorant              Men may shave face and neck.   Do not bring valuables to the hospital. [facility] IS NOT             RESPONSIBLE   FOR VALUABLES.  Contacts, dentures or bridgework may not be worn into surgery.      Patients discharged the day of surgery will not be allowed to drive home  . IF YOU ARE HAVING SURGERY AND GOING HOME THE SAME DAY, YOU MUST HAVE AN ADULT TO DRIVE YOU HOME AND BE WITH YOU FOR 24 HOURS.   YOU MAY GO HOME BY TAXI OR UBER OR ORTHERWISE, BUT AN ADULT MUST ACCOMPANY YOU HOME AND STAY WITH YOU FOR 24 HOURS.  Name and phone number of your driver:  Special  Instructions: N/A              Please read over the following fact sheets you were given: _____________________________________________________________________             [facility] - Preparing for Surgery Before surgery, you can play an important role.  Because skin is not sterile, your skin needs to be as free of germs as possible.   You can reduce the number of germs on your skin by washing with CHG (chlorahexidine gluconate) soap before surgery.  CHG is an antiseptic cleaner which kills germs and bonds with the skin to continue killing germs even after washing. Please DO NOT use if you have an allergy to CHG or antibacterial soaps.  If your skin becomes reddened/irritated stop using the CHG and inform your nurse when you arrive at Short Stay. You may shave your face/neck.  Please follow these instructions carefully:  1.  Shower with CHG Soap the night before surgery and the  morning of Surgery.  2.  If you choose to wash your hair, wash your hair first as usual with your  normal  shampoo.  3.  After you shampoo, rinse your hair and body thoroughly to remove the  shampoo.                                        4.  Use CHG as you would any other liquid soap.  You can apply chg directly  to the skin and wash                       Gently with a scrungie or clean washcloth.  5.  Apply the CHG Soap to your body ONLY FROM THE NECK DOWN.   Do not use on face/ open                           Wound or open sores. Avoid contact with eyes, ears mouth and genitals (private parts).                       Wash face,  Genitals (private parts) with your normal soap.             6.  Wash thoroughly, paying special attention to the area where your surgery  will be performed.  7.  Thoroughly rinse your body with warm water from the neck down.  8.  DO NOT shower/wash with your normal soap after using and rinsing off  the CHG Soap.             9.  Pat yourself dry with a clean towel.            10.  Wear  clean pajamas.            11.  Place clean sheets on your bed the night of your first shower and do not  sleep with pets. Day of Surgery : Do not apply any lotions/deodorants the morning of surgery.  Please wear clean clothes to the hospital/surgery center.  FAILURE TO FOLLOW THESE INSTRUCTIONS MAY RESULT IN THE CANCELLATION OF YOUR SURGERY PATIENT SIGNATURE_________________________________  NURSE SIGNATURE__________________________________  ________________________________________________________________________

## 2020-06-04 NOTE — Telephone Encounter (Signed)
Spoke with patient to assist with manual ICD transmission as scheduled transmission on 03/28/20 was missed. Transmission received, added to schedule for processing.

## 2020-06-04 NOTE — Progress Notes (Signed)
PERIOPERATIVE PRESCRIPTION FOR IMPLANTED CARDIAC DEVICE PROGRAMMING  Patient Information: Name:  Brandon Frye  DOB:  1946-04-21  MRN:  [id number]   Planned Procedure: Open Rt inguinal hernia repair  Surgeon: Dr. Milford Cage  Date of Procedure: 06/11/20  Cautery will be used.  Position during surgery:    Please send documentation back to:  [facility] (Fax # [phone])   Device Information:  Clinic EP Physician:  Sherryl Manges, MD  Device Type:  Defibrillator Manufacturer and Phone #:  Medtronic: [id number] Pacemaker Dependent?:  No. Date of Last Device Check:  06/04/2020 Normal Device Function?:  Yes.    Electrophysiologist's Recommendations:   Have magnet available.  Provide continuous ECG monitoring when magnet is used or reprogramming is to be performed.   Procedure should not interfere with device function.  No device programming or magnet placement needed.  Per Device Clinic [address], [place], [place]  8:55 AM 06/04/2020

## 2020-06-05 ENCOUNTER — Encounter (HOSPITAL_COMMUNITY): Payer: Self-pay

## 2020-06-05 ENCOUNTER — Other Ambulatory Visit: Payer: Self-pay

## 2020-06-05 ENCOUNTER — Encounter (HOSPITAL_COMMUNITY)
Admission: RE | Admit: 2020-06-05 | Discharge: 2020-06-05 | Disposition: A | Discharge status: Home / Self Care | Payer: Medicare Other | Source: Ambulatory Visit | Attending: Surgery | Admitting: Surgery

## 2020-06-05 DIAGNOSIS — Z01812 Encounter for preprocedural laboratory examination: Secondary | ICD-10-CM | POA: Insufficient documentation

## 2020-06-05 LAB — CUP PACEART REMOTE DEVICE CHECK
Battery Remaining Longevity: 136 mo
Battery Voltage: 3.1 V
Brady Statistic RV Percent Paced: 0.02 %
Date Time Interrogation Session: 20210802080016
HighPow Impedance: 72 Ohm
Implantable Lead Implant Date: 20210222
Implantable Lead Location: 753860
Implantable Pulse Generator Implant Date: 20210222
Lead Channel Impedance Value: 418 Ohm
Lead Channel Impedance Value: 513 Ohm
Lead Channel Pacing Threshold Amplitude: 0.625 V
Lead Channel Pacing Threshold Pulse Width: 0.4 ms
Lead Channel Sensing Intrinsic Amplitude: 6.125 mV
Lead Channel Sensing Intrinsic Amplitude: 6.125 mV
Lead Channel Setting Pacing Amplitude: 2.5 V
Lead Channel Setting Pacing Pulse Width: 0.4 ms
Lead Channel Setting Sensing Sensitivity: 0.3 mV

## 2020-06-05 LAB — BASIC METABOLIC PANEL
Anion gap: 7 (ref 5–15)
BUN: 23 mg/dL (ref 8–23)
CO2: 29 mmol/L (ref 22–32)
Calcium: 9.6 mg/dL (ref 8.9–10.3)
Chloride: 103 mmol/L (ref 98–111)
Creatinine, Ser: 1.21 mg/dL (ref 0.61–1.24)
GFR calc Af Amer: >60 mL/min (ref >60)
GFR calc non Af Amer: 60 mL/min — ABNORMAL LOW (ref >60)
Glucose, Bld: 111 mg/dL — ABNORMAL HIGH (ref 70–99)
Potassium: 4.9 mmol/L (ref 3.5–5.1)
Sodium: 139 mmol/L (ref 135–145)

## 2020-06-05 LAB — CBC
HCT: 47.4 % (ref 39.0–52.0)
Hemoglobin: 15.6 g/dL (ref 13.0–17.0)
MCH: 28.5 pg (ref 26.0–34.0)
MCHC: 32.9 g/dL (ref 30.0–36.0)
MCV: 86.7 fL (ref 80.0–100.0)
Platelets: 220 10*3/uL (ref 150–400)
RBC: 5.47 MIL/uL (ref 4.22–5.81)
RDW: 13.2 % (ref 11.5–15.5)
WBC: 4.6 10*3/uL (ref 4.0–10.5)
nRBC: 0 % (ref 0.0–0.2)

## 2020-06-05 NOTE — Progress Notes (Signed)
COVID Vaccine Completed:Yes Date COVID Vaccine completed:02/06/20 COVID vaccine manufacturer: Pfizer     PCP - [facility] Cardiologist - Dr. Marveen Reeks  Chest x-ray - 12/11/19 EKG - 04/12/20 Stress Test - no ECHO - no Cardiac Cath - 2017, 12/23/19, AICD implant 12/26/19  Sleep Study - no CPAP -   Fasting Blood Sugar - no Checks Blood Sugar _____ times a day  Blood Thinner Instructions:Xarelto, ASA/ Dr. Excell Seltzer Aspirin Instructions:continue ASA, stop Xarelto 2 days prior to [employer] Last Dose:06/08/20  Anesthesia review:   Patient denies shortness of breath, fever, cough and chest pain at PAT appointment yes   Patient verbalized understanding of instructions that were given to them at the PAT appointment. Patient was also instructed that they will need to review over the PAT instructions again at home before surgery. Yes  Pt is a long time smoker but he denies getting SOB climbing 4 flights of stairs or while doing ADLs. He is new to the area and usually uses the [place] he is trying to arrange transportation home from the hospital.

## 2020-06-06 NOTE — Progress Notes (Signed)
Remote ICD transmission.   

## 2020-06-07 ENCOUNTER — Other Ambulatory Visit (HOSPITAL_COMMUNITY)
Admission: RE | Admit: 2020-06-07 | Discharge: 2020-06-07 | Disposition: A | Discharge status: Home / Self Care | Payer: Medicare Other | Source: Ambulatory Visit | Attending: Surgery | Admitting: Surgery

## 2020-06-07 DIAGNOSIS — Z20822 Contact with and (suspected) exposure to covid-19: Secondary | ICD-10-CM | POA: Diagnosis not present

## 2020-06-07 DIAGNOSIS — Z01812 Encounter for preprocedural laboratory examination: Secondary | ICD-10-CM | POA: Diagnosis present

## 2020-06-07 LAB — SARS CORONAVIRUS 2 (TAT 6-24 HRS): SARS Coronavirus 2: NEGATIVE

## 2020-06-10 MED ORDER — BUPIVACAINE LIPOSOME 1.3 % IJ SUSP
20.0000 mL | Freq: Once | INTRAMUSCULAR | Status: DC
  Filled 2020-06-10: qty 20

## 2020-06-10 NOTE — Anesthesia Preprocedure Evaluation (Addendum)
Anesthesia Evaluation  Patient identified by MRN, date of birth, ID band Patient awake    Reviewed: Allergy & Precautions, NPO status , Patient's Chart, lab work & pertinent test results  Airway Mallampati: II  TM Distance: >3 FB Neck ROM: Full    Dental  (+) Edentulous Upper, Poor Dentition, Missing, Partial Lower   Pulmonary COPD, Current Smoker,    Pulmonary exam normal breath sounds clear to auscultation       Cardiovascular hypertension, Pt. on home beta blockers and Pt. 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   Neuro/Psych negative neurological ROS     GI/Hepatic negative GI ROS, Neg liver ROS,   Endo/Other  negative endocrine ROS  Renal/GU negative Renal ROS     Musculoskeletal  (+) Arthritis ,   Abdominal (+) - obese,   Peds  Hematology negative hematology ROS (+)   Anesthesia Other Findings   Reproductive/Obstetrics                           Anesthesia Physical Anesthesia Plan  ASA: IV  Anesthesia Plan: General   Post-op Pain Management:    Induction: Intravenous  PONV Risk Score and Plan: 3 and Ondansetron, Dexamethasone and Treatment may vary due to age or medical condition  Airway Management Planned: Oral ETT  Additional Equipment: Arterial line  Intra-op Plan:   Post-operative Plan: Extubation in OR  Informed Consent: I have reviewed the patients History and Physical, chart, labs and discussed the procedure including the risks, benefits and alternatives for the proposed anesthesia with the patient or authorized representative who has indicated his/her understanding and acceptance.     Dental advisory  given  Plan Discussed with: CRNA  Anesthesia Plan Comments:       Anesthesia Quick Evaluation

## 2020-06-11 ENCOUNTER — Encounter (HOSPITAL_COMMUNITY): Payer: Self-pay | Admitting: Surgery

## 2020-06-11 ENCOUNTER — Ambulatory Visit (HOSPITAL_COMMUNITY)
Admission: RE | Admit: 2020-06-11 | Discharge: 2020-06-11 | Disposition: A | Discharge status: Home / Self Care | Payer: No Typology Code available for payment source | Source: Ambulatory Visit | Attending: Surgery | Admitting: Surgery

## 2020-06-11 ENCOUNTER — Ambulatory Visit (HOSPITAL_COMMUNITY): Payer: No Typology Code available for payment source | Admitting: Anesthesiology

## 2020-06-11 ENCOUNTER — Encounter (HOSPITAL_COMMUNITY)
Admission: RE | Disposition: A | Discharge status: Home / Self Care | Payer: Self-pay | Source: Ambulatory Visit | Attending: Surgery

## 2020-06-11 ENCOUNTER — Other Ambulatory Visit: Payer: Self-pay

## 2020-06-11 DIAGNOSIS — Z888 Allergy status to other drugs, medicaments and biological substances status: Secondary | ICD-10-CM | POA: Diagnosis not present

## 2020-06-11 DIAGNOSIS — I5022 Chronic systolic (congestive) heart failure: Secondary | ICD-10-CM | POA: Diagnosis not present

## 2020-06-11 DIAGNOSIS — I428 Other cardiomyopathies: Secondary | ICD-10-CM | POA: Insufficient documentation

## 2020-06-11 DIAGNOSIS — I480 Paroxysmal atrial fibrillation: Secondary | ICD-10-CM | POA: Insufficient documentation

## 2020-06-11 DIAGNOSIS — Z7982 Long term (current) use of aspirin: Secondary | ICD-10-CM | POA: Insufficient documentation

## 2020-06-11 DIAGNOSIS — M1990 Unspecified osteoarthritis, unspecified site: Secondary | ICD-10-CM | POA: Insufficient documentation

## 2020-06-11 DIAGNOSIS — F17210 Nicotine dependence, cigarettes, uncomplicated: Secondary | ICD-10-CM | POA: Insufficient documentation

## 2020-06-11 DIAGNOSIS — Z79899 Other long term (current) drug therapy: Secondary | ICD-10-CM | POA: Diagnosis not present

## 2020-06-11 DIAGNOSIS — I110 Hypertensive heart disease with heart failure: Secondary | ICD-10-CM | POA: Insufficient documentation

## 2020-06-11 DIAGNOSIS — Z7901 Long term (current) use of anticoagulants: Secondary | ICD-10-CM | POA: Diagnosis not present

## 2020-06-11 DIAGNOSIS — K4090 Unilateral inguinal hernia, without obstruction or gangrene, not specified as recurrent: Secondary | ICD-10-CM | POA: Insufficient documentation

## 2020-06-11 DIAGNOSIS — J449 Chronic obstructive pulmonary disease, unspecified: Secondary | ICD-10-CM | POA: Diagnosis not present

## 2020-06-11 DIAGNOSIS — Z95810 Presence of automatic (implantable) cardiac defibrillator: Secondary | ICD-10-CM | POA: Insufficient documentation

## 2020-06-11 HISTORY — PX: INSERTION OF MESH: SHX5868

## 2020-06-11 HISTORY — PX: INGUINAL HERNIA REPAIR: SHX194

## 2020-06-11 SURGERY — REPAIR, HERNIA, INGUINAL, ADULT
Anesthesia: General | Site: Inguinal | Laterality: Right

## 2020-06-11 MED ORDER — PHENYLEPHRINE 40 MCG/ML (10ML) SYRINGE FOR IV PUSH (FOR BLOOD PRESSURE SUPPORT)
PREFILLED_SYRINGE | INTRAVENOUS | Status: DC | PRN
  Administered 2020-06-11 (×2): 40 ug via INTRAVENOUS
  Administered 2020-06-11: 80 ug via INTRAVENOUS

## 2020-06-11 MED ORDER — ONDANSETRON HCL 4 MG/2ML IJ SOLN
INTRAMUSCULAR | Status: AC
  Filled 2020-06-11: qty 2

## 2020-06-11 MED ORDER — SODIUM CHLORIDE 0.9% FLUSH: 3.0000 mL | INTRAVENOUS | Status: DC | PRN

## 2020-06-11 MED ORDER — LIDOCAINE 2% (20 MG/ML) 5 ML SYRINGE
INTRAMUSCULAR | Status: AC
  Filled 2020-06-11: qty 5

## 2020-06-11 MED ORDER — EPHEDRINE 5 MG/ML INJ
INTRAVENOUS | Status: AC
  Filled 2020-06-11: qty 10

## 2020-06-11 MED ORDER — ACETAMINOPHEN 325 MG PO TABS: 650.0000 mg | ORAL_TABLET | ORAL | Status: DC | PRN

## 2020-06-11 MED ORDER — PROPOFOL 10 MG/ML IV BOLUS
INTRAVENOUS | Status: AC
  Filled 2020-06-11: qty 20

## 2020-06-11 MED ORDER — ROCURONIUM BROMIDE 10 MG/ML (PF) SYRINGE
PREFILLED_SYRINGE | INTRAVENOUS | Status: DC | PRN
  Administered 2020-06-11: 50 mg via INTRAVENOUS
  Administered 2020-06-11: 20 mg via INTRAVENOUS

## 2020-06-11 MED ORDER — SUCCINYLCHOLINE CHLORIDE 20 MG/ML IJ SOLN
INTRAMUSCULAR | Status: DC | PRN
Start: 2020-06-11 — End: 2020-06-11
  Administered 2020-06-11: 120 mg via INTRAVENOUS

## 2020-06-11 MED ORDER — ALBUTEROL SULFATE HFA 108 (90 BASE) MCG/ACT IN AERS
INHALATION_SPRAY | RESPIRATORY_TRACT | Status: DC | PRN
  Administered 2020-06-11: 2 via RESPIRATORY_TRACT

## 2020-06-11 MED ORDER — CHLORHEXIDINE GLUCONATE 4 % EX LIQD: 60.0000 mL | Freq: Once | CUTANEOUS | Status: DC

## 2020-06-11 MED ORDER — 0.9 % SODIUM CHLORIDE (POUR BTL) OPTIME
TOPICAL | Status: DC | PRN
  Administered 2020-06-11: 1000 mL

## 2020-06-11 MED ORDER — DOCUSATE SODIUM 100 MG PO CAPS
100.0000 mg | ORAL_CAPSULE | Freq: Two times a day (BID) | ORAL | 0 refills | Status: AC
Start: 2020-06-11 — End: 2020-07-11

## 2020-06-11 MED ORDER — ONDANSETRON HCL 4 MG/2ML IJ SOLN
INTRAMUSCULAR | Status: DC | PRN
  Administered 2020-06-11: 4 mg via INTRAVENOUS

## 2020-06-11 MED ORDER — ACETAMINOPHEN 500 MG PO TABS
1000.0000 mg | ORAL_TABLET | ORAL | Status: AC
  Administered 2020-06-11: 1000 mg via ORAL
  Filled 2020-06-11: qty 2

## 2020-06-11 MED ORDER — OXYCODONE HCL 5 MG PO TABS: 5.0000 mg | ORAL_TABLET | Freq: Three times a day (TID) | ORAL | 0 refills | Status: DC | PRN

## 2020-06-11 MED ORDER — DEXAMETHASONE SODIUM PHOSPHATE 10 MG/ML IJ SOLN
INTRAMUSCULAR | Status: AC
  Filled 2020-06-11: qty 1

## 2020-06-11 MED ORDER — LACTATED RINGERS IV SOLN: INTRAVENOUS | Status: DC

## 2020-06-11 MED ORDER — DEXAMETHASONE SODIUM PHOSPHATE 10 MG/ML IJ SOLN
INTRAMUSCULAR | Status: DC | PRN
  Administered 2020-06-11: 7 mg via INTRAVENOUS

## 2020-06-11 MED ORDER — BUPIVACAINE LIPOSOME 1.3 % IJ SUSP
INTRAMUSCULAR | Status: DC | PRN
  Administered 2020-06-11: 20 mL

## 2020-06-11 MED ORDER — FENTANYL CITRATE (PF) 100 MCG/2ML IJ SOLN
INTRAMUSCULAR | Status: DC | PRN
  Administered 2020-06-11: 100 ug via INTRAVENOUS
  Administered 2020-06-11: 50 ug via INTRAVENOUS

## 2020-06-11 MED ORDER — CHLORHEXIDINE GLUCONATE 0.12 % MT SOLN
15.0000 mL | Freq: Once | OROMUCOSAL | Status: AC
  Administered 2020-06-11: 15 mL via OROMUCOSAL

## 2020-06-11 MED ORDER — ONDANSETRON HCL 4 MG/2ML IJ SOLN: 4.0000 mg | Freq: Once | INTRAMUSCULAR | Status: DC | PRN

## 2020-06-11 MED ORDER — FENTANYL CITRATE (PF) 100 MCG/2ML IJ SOLN: 25.0000 ug | INTRAMUSCULAR | Status: DC | PRN

## 2020-06-11 MED ORDER — EPHEDRINE SULFATE-NACL 50-0.9 MG/10ML-% IV SOSY
PREFILLED_SYRINGE | INTRAVENOUS | Status: DC | PRN
  Administered 2020-06-11: 5 mg via INTRAVENOUS

## 2020-06-11 MED ORDER — FENTANYL CITRATE (PF) 250 MCG/5ML IJ SOLN
INTRAMUSCULAR | Status: AC
  Filled 2020-06-11: qty 5

## 2020-06-11 MED ORDER — ORAL CARE MOUTH RINSE: 15.0000 mL | Freq: Once | OROMUCOSAL | Status: AC

## 2020-06-11 MED ORDER — SUGAMMADEX SODIUM 200 MG/2ML IV SOLN
INTRAVENOUS | Status: DC | PRN
  Administered 2020-06-11: 200 mg via INTRAVENOUS

## 2020-06-11 MED ORDER — PHENYLEPHRINE HCL-NACL 10-0.9 MG/250ML-% IV SOLN
INTRAVENOUS | Status: DC | PRN
  Administered 2020-06-11: 35 ug/min via INTRAVENOUS

## 2020-06-11 MED ORDER — ACETAMINOPHEN 650 MG RE SUPP
650.0000 mg | RECTAL | Status: DC | PRN
  Filled 2020-06-11: qty 1

## 2020-06-11 MED ORDER — PROPOFOL 10 MG/ML IV BOLUS
INTRAVENOUS | Status: DC | PRN
  Administered 2020-06-11: 70 mg via INTRAVENOUS
  Administered 2020-06-11: 40 mg via INTRAVENOUS

## 2020-06-11 MED ORDER — ALBUTEROL SULFATE HFA 108 (90 BASE) MCG/ACT IN AERS
INHALATION_SPRAY | RESPIRATORY_TRACT | Status: AC
  Filled 2020-06-11: qty 6.7

## 2020-06-11 MED ORDER — SODIUM CHLORIDE 0.9 % IV SOLN: 250.0000 mL | INTRAVENOUS | Status: DC | PRN

## 2020-06-11 MED ORDER — BUPIVACAINE-EPINEPHRINE 0.25% -1:200000 IJ SOLN
INTRAMUSCULAR | Status: DC | PRN
  Administered 2020-06-11: 30 mL

## 2020-06-11 MED ORDER — PHENYLEPHRINE 40 MCG/ML (10ML) SYRINGE FOR IV PUSH (FOR BLOOD PRESSURE SUPPORT)
PREFILLED_SYRINGE | INTRAVENOUS | Status: AC
  Filled 2020-06-11: qty 10

## 2020-06-11 MED ORDER — CEFAZOLIN SODIUM-DEXTROSE 2-4 GM/100ML-% IV SOLN
2.0000 g | INTRAVENOUS | Status: AC
  Administered 2020-06-11: 2 g via INTRAVENOUS
  Filled 2020-06-11: qty 100

## 2020-06-11 MED ORDER — LIDOCAINE 2% (20 MG/ML) 5 ML SYRINGE
INTRAMUSCULAR | Status: DC | PRN
  Administered 2020-06-11: 100 mg via INTRAVENOUS

## 2020-06-11 MED ORDER — OXYCODONE HCL 5 MG PO TABS: 5.0000 mg | ORAL_TABLET | ORAL | Status: DC | PRN

## 2020-06-11 MED ORDER — BUPIVACAINE-EPINEPHRINE (PF) 0.25% -1:200000 IJ SOLN
INTRAMUSCULAR | Status: AC
  Filled 2020-06-11: qty 30

## 2020-06-11 MED ORDER — VASOPRESSIN 20 UNIT/ML IV SOLN
INTRAVENOUS | Status: AC
  Filled 2020-06-11: qty 1

## 2020-06-11 MED ORDER — ROCURONIUM BROMIDE 10 MG/ML (PF) SYRINGE
PREFILLED_SYRINGE | INTRAVENOUS | Status: AC
  Filled 2020-06-11: qty 10

## 2020-06-11 MED ORDER — GABAPENTIN 300 MG PO CAPS
300.0000 mg | ORAL_CAPSULE | ORAL | Status: AC
  Administered 2020-06-11: 300 mg via ORAL
  Filled 2020-06-11: qty 1

## 2020-06-11 MED ORDER — SODIUM CHLORIDE 0.9% FLUSH: 3.0000 mL | Freq: Two times a day (BID) | INTRAVENOUS | Status: DC

## 2020-06-11 SURGICAL SUPPLY — 40 items
APL PRP STRL LF DISP 70% ISPRP (MISCELLANEOUS) ×1
APL SKNCLS STERI-STRIP NONHPOA (GAUZE/BANDAGES/DRESSINGS) ×1
BENZOIN TINCTURE PRP APPL 2/3 (GAUZE/BANDAGES/DRESSINGS) ×2 IMPLANT
BLADE SURG 15 STRL LF DISP TIS (BLADE) ×1 IMPLANT
BLADE SURG 15 STRL SS (BLADE) ×2
CHLORAPREP W/TINT 26 (MISCELLANEOUS) ×2 IMPLANT
COVER SURGICAL LIGHT HANDLE (MISCELLANEOUS) ×2 IMPLANT
COVER WAND RF STERILE (DRAPES) IMPLANT
DECANTER SPIKE VIAL GLASS SM (MISCELLANEOUS) ×2 IMPLANT
DRAIN PENROSE 0.5X18 (DRAIN) ×2 IMPLANT
DRAPE LAPAROSCOPIC ABDOMINAL (DRAPES) ×2 IMPLANT
ELECT REM PT RETURN 15FT ADLT (MISCELLANEOUS) ×2 IMPLANT
GAUZE SPONGE 4X4 12PLY STRL (GAUZE/BANDAGES/DRESSINGS) ×2 IMPLANT
GLOVE BIO SURGEON STRL SZ 6 (GLOVE) ×2 IMPLANT
GLOVE INDICATOR 6.5 STRL GRN (GLOVE) ×2 IMPLANT
GOWN STRL REUS W/TWL LRG LVL3 (GOWN DISPOSABLE) ×2 IMPLANT
GOWN STRL REUS W/TWL XL LVL3 (GOWN DISPOSABLE) ×2 IMPLANT
KIT BASIN OR (CUSTOM PROCEDURE TRAY) ×2 IMPLANT
KIT TURNOVER KIT A (KITS) IMPLANT
MESH ULTRAPRO 3X6 7.6X15CM (Mesh General) ×2 IMPLANT
NEEDLE HYPO 22GX1.5 SAFETY (NEEDLE) ×2 IMPLANT
PACK BASIC VI WITH GOWN DISP (CUSTOM PROCEDURE TRAY) ×2 IMPLANT
PENCIL SMOKE EVACUATOR (MISCELLANEOUS) IMPLANT
SPONGE LAP 4X18 RFD (DISPOSABLE) ×2 IMPLANT
STRIP CLOSURE SKIN 1/2X4 (GAUZE/BANDAGES/DRESSINGS) ×2 IMPLANT
SUT ETHIBOND 0 MO6 C/R (SUTURE) ×2 IMPLANT
SUT MNCRL AB 4-0 PS2 18 (SUTURE) ×2 IMPLANT
SUT SILK 3 0 (SUTURE) ×2
SUT SILK 3-0 18XBRD TIE 12 (SUTURE) ×1 IMPLANT
SUT VIC AB 3-0 SH 27 (SUTURE) ×4
SUT VIC AB 3-0 SH 27XBRD (SUTURE) ×2 IMPLANT
SUT VICRYL 0 UR6 27IN ABS (SUTURE) IMPLANT
SUT VICRYL 3 0 BR 18  UND (SUTURE) ×1
SUT VICRYL 3 0 BR 18 UND (SUTURE) ×1 IMPLANT
SYR CONTROL 10ML LL (SYRINGE) ×2 IMPLANT
TAPE CLOTH SURG 4X10 WHT LF (GAUZE/BANDAGES/DRESSINGS) ×2 IMPLANT
TOWEL OR 17X26 10 PK STRL BLUE (TOWEL DISPOSABLE) ×2 IMPLANT
TOWEL OR NON WOVEN STRL DISP B (DISPOSABLE) ×2 IMPLANT
TRAY FOLEY MTR SLVR 16FR STAT (SET/KITS/TRAYS/PACK) IMPLANT
YANKAUER SUCT BULB TIP 10FT TU (MISCELLANEOUS) IMPLANT

## 2020-06-11 NOTE — H&P (Signed)
Surgical Evaluation  Chief Complaint: hernia  HPI: 74yo man with multiple medical problems who presents with chief complaint of right inguinal hernia. He presented to the [place] and then to the [facility] ER and incarceration event 3 weeks ago, though it had become reducible by the time the emergency room physician evaluated. He had noticed a hernia for several months prior to that but it has been reducible. Notes that it protrudes with straining, coughing or changing positions and causes pain when this happens. He does have episodes of coughing from time to time. He denies prior abdominal surgery or hernia surgery. He was essentially healthy for much of his life until this past year. 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 Cardiologist is Dr. Graciela Husbands.  He is on XARELTO.  History of tobacco abuse.  Allergies  Allergen Reactions  . Lisinopril Cough        Past Medical History:  Diagnosis Date  . Arthritis   . Chronic systolic CHF (congestive heart failure) (HCC)   . Non-ischemic cardiomyopathy (HCC) 2017   clean cath, EF 30%         Past Surgical History:  Procedure Laterality Date  . CARDIAC CATHETERIZATION  2017   at [place] in [place], clean, EF 30%  . ICD IMPLANT N/A 12/26/2019   Procedure: ICD IMPLANT;  Surgeon: Duke Salvia, MD;  Location: [facility] INVASIVE CV LAB;  Service: Cardiovascular;  Laterality: N/A;  . KNEE SURGERY    . RIGHT/LEFT HEART CATH AND CORONARY ANGIOGRAPHY N/A 12/23/2019   Procedure: RIGHT/LEFT HEART CATH AND CORONARY ANGIOGRAPHY;  Surgeon: Laurey Morale, MD;  Location: [facility] INVASIVE CV LAB;  Service: Cardiovascular;  Laterality: N/A;         Family History  Problem Relation Age of Onset  . Stroke Mother        Multiple strokes, died at 75  . Heart attack Father 71    Social History         Socioeconomic History  . Marital status: Single    Spouse name: Not on file  . Number of children: Not on file  . Years of education: Not on file  . Highest education level: Not on file  Occupational History  . Not on file  Tobacco Use  . Smoking status: Current Every Day Smoker    Packs/day: 1.00    Years: 25.00    Pack years: 25.00    Types: Cigarettes  . Smokeless tobacco: Never Used  Substance and Sexual Activity  . Alcohol use: No  . Drug use: No  . Sexual activity: Not on file  Other Topics Concern  . Not on file  Social History Narrative  . Not on file   Social Determinants of Health      Financial Resource Strain:   . Difficulty of Paying Living Expenses:   Food Insecurity:   . Worried About Programme researcher, broadcasting/film/video in the Last Year:   . Barista in the Last Year:   Transportation Needs:   . Freight forwarder (Medical):   [name] Lack of Transportation (Non-Medical):   Physical Activity:   . Days of Exercise per Week:   . Minutes of Exercise per Session:   Stress:   . Feeling of Stress :   Social Connections:   . Frequency of Communication with Friends and Family:   . Frequency of Social  Gatherings with Friends and Family:   . Attends Religious Services:   . Active Member of Clubs or Organizations:   . Attends Banker Meetings:   [name] Marital Status:           Current Outpatient Medications on File Prior to Visit  Medication Sig Dispense Refill  . acetaminophen (TYLENOL) 500 MG tablet Take 2 tablets (1,000 mg total) by mouth every 6 (six) hours as needed for mild pain, moderate pain or headache. 30 tablet 0  . albuterol (VENTOLIN HFA) 108 (90 Base) MCG/ACT inhaler Inhale 4 puffs into the lungs every 6 (six) hours as needed for wheezing or shortness of breath.    [name] aspirin EC 81 MG tablet Take 81 mg by mouth daily.    . carvedilol (COREG) 12.5 MG tablet Take 1 tablet (12.5 mg total) by mouth 2 (two) times daily with a meal.  180 tablet 3  . furosemide (LASIX) 20 MG tablet Take 1 tablet (20 mg total) by mouth daily as needed for fluid. 90 tablet 3  . Ipratropium-Albuterol (COMBIVENT RESPIMAT) 20-100 MCG/ACT AERS respimat Inhale 1 puff into the lungs every 6 (six) hours as needed for wheezing or shortness of breath (or coughing). 1 Inhaler 0  . rivaroxaban (XARELTO) 10 MG TABS tablet Take 1 tablet (10 mg total) by mouth daily. Start 12/29/19 90 tablet 3  . sacubitril-valsartan (ENTRESTO) 49-51 MG Take 1 tablet by mouth 2 (two) times daily. 180 tablet 3  . spironolactone (ALDACTONE) 25 MG tablet Take 1 tablet (25 mg total) by mouth daily. 90 tablet 3   No current facility-administered medications on file prior to visit.    Review of Systems: a complete, 10pt review of systems was completed with pertinent positives and negatives as documented in the HPI  Physical Exam: Vitals  Weight: 176.13 lb Height: 72in Body Surface Area: 2.02 m Body Mass Index: 23.89 kg/m  Temp.: 97.85F  Pulse: 87 (Regular)  P.OX: 98% (Room air) BP: 102/62(Sitting, Left Arm, Standard)  Alert and well-appearing Unlabored respirations Abdomen soft and nontender. There is a reducible right inguinal hernia which is tender. No lower extremity edema   CBC Latest Ref Rng & Units 03/09/2020 01/05/2020 12/27/2019  WBC 4.0 - 10.5 K/uL 5.6 5.4 4.2  Hemoglobin 13.0 - 17.0 g/dL 64.4 03.4 17.4(H)  Hematocrit 39.0 - 52.0 % 44.9 50.0 51.0  Platelets 150 - 400 K/uL 235 396 209    CMP Latest Ref Rng & Units 03/12/2020 03/09/2020 02/24/2020  Glucose 70 - 99 mg/dL 99 87 87  BUN 8 - 23 mg/dL 74(Q) 23 22  Creatinine 0.61 - 1.24 mg/dL 5.95(G) 3.87(F) 6.43(P)  Sodium 135 - 145 mmol/L 138 137 139  Potassium 3.5 - 5.1 mmol/L 5.1 4.7 5.4(H)  Chloride 98 - 111 mmol/L 107 106 103  CO2 22 - 32 mmol/L 18(L) 23 29  Calcium 8.9 - 10.3 mg/dL 9.1 9.4 9.5  Total Protein 6.5 - 8.1 g/dL - 7.0 -  Total Bilirubin 0.3 - 1.2 mg/dL - 0.8 -  Alkaline Phos  38 - 126 U/L - 79 -  AST 15 - 41 U/L - 14(L) -  ALT 0 - 44 U/L - 12 -    Recent Labs       Lab Results  Component Value Date   INR 1.4 (H) 01/05/2020   INR 2.0 (H) 08/19/2019      Imaging: Imaging Results (Last 48 hours)  No results found.     A/P: RIGHT INGUINAL HERNIA (  K40.90) Story: Increasingly symptomatic with pain and episode of incarceration. I recommended open repair with mesh. We discussed the relevant anatomy and we discussed the technique of the procedure. Discussed risks of bleeding, infection, pain, scarring, injury to structures in the area including nerves, blood vessels, bowel, bladder, risk of chronic pain, hernia recurrence, risk of seroma or hematoma, urinary retention, and risks of general anesthesia including cardiovascular, pulmonary, and thromboembolic complications. Questions were answered. Patient wishes to proceed with scheduling. He will need cardiac clearance and to hold his Xarelto preoperatively.  Surgery initially postponed due to elevated potassium and creatinine compared to baseline- he has followed up with his pcp at [facility] and brought copies of his more recent labs showing normalization of K and creatinine back down.         Patient Active Problem List   Diagnosis Date Noted  . ICD (implantable cardioverter-defibrillator) in place 03/26/2020  . Acute on chronic combined systolic and diastolic CHF (congestive heart failure) (HCC)   . Cardiomyopathy (HCC) 12/22/2019  . COPD (chronic obstructive pulmonary disease) (HCC) 12/22/2019  . Hypertensive urgency 12/22/2019  . Lung nodule 12/22/2019  . Heart failure (HCC) 12/22/2019  . Arthritis   . Acute on chronic congestive heart failure (HCC)   . Syncope   . NSVT (nonsustained ventricular tachycardia) (HCC)        Phylliss Blakes, MD [facility], [place]

## 2020-06-11 NOTE — Anesthesia Procedure Notes (Signed)
Arterial Line Insertion Start/End8/07/2020 10:00 AM Performed by: Mitzie Na, CRNA, CRNA  Patient location: Pre-op. Preanesthetic checklist: patient identified, IV checked, site marked, risks and benefits discussed, surgical consent, monitors and equipment checked, pre-op evaluation, timeout performed and anesthesia consent Lidocaine 1% used for infiltration Left, radial was placed Catheter size: 20 Fr Hand hygiene performed  and maximum sterile barriers used   Attempts: 2 Procedure performed without using ultrasound guided technique. Following insertion, dressing applied and Biopatch. Post procedure assessment: normal and unchanged  Patient tolerated the procedure well with no immediate complications. Additional procedure comments: A-line kit expiration date verified.[name]

## 2020-06-11 NOTE — Discharge Instructions (Signed)
HERNIA REPAIR: POST OP INSTRUCTIONS   EAT Gradually transition to a high fiber diet with a fiber supplement over the next few weeks after discharge.  Start with a pureed / full liquid diet (see below)  WALK Walk an hour a day (cumulative- not all at once).  Control your pain to do that.    CONTROL PAIN Control pain so that you can walk, sleep, tolerate sneezing/coughing, and go up/down stairs.  HAVE A BOWEL MOVEMENT DAILY Keep your bowels regular to avoid problems.  OK to try a laxative to override constipation.  OK to use an antidairrheal to slow down diarrhea.  Call if not better after 2 tries  CALL IF YOU HAVE PROBLEMS/CONCERNS Call if you are still struggling despite following these instructions. Call if you have concerns not answered by these instructions  ######################################################################    1. DIET: Follow a light bland diet & liquids the first 24 hours after arrival home, such as soup, liquids, starches, etc.  Be sure to drink plenty of fluids.  Quickly advance to a usual solid diet within a few days.  Avoid fast food or heavy meals as your are more likely to get nauseated or have irregular bowels.  A low-sugar, high-fiber diet for the rest of your life is ideal.   2. Take your usually prescribed home medications unless otherwise directed.  3. PAIN CONTROL: a. Pain is best controlled by a usual combination of three different methods TOGETHER: i. Ice/Heat ii. Over the counter pain medication iii. Prescription pain medication b. Most patients will experience some swelling and bruising around the hernia(s) such as the bellybutton, groins, or old incisions.  Ice packs or heating pads (30-60 minutes up to 6 times a day) will help. Use ice for the first few days to help decrease swelling and bruising, then switch to heat to help relax tight/sore spots and speed recovery.  Some people prefer to use ice alone, heat alone, alternating between ice &  heat.  Experiment to what works for you.  Swelling and bruising can take several weeks to resolve.   c. It is helpful to take an over-the-counter pain medication regularly for the first days: i. Acetaminophen (Tylenol, etc) 325-650mg  four times a day (every meal & bedtime) d. A  prescription for pain medication should be given to you upon discharge.  Take your pain medication as prescribed, IF NEEDED.  i. If you are having problems/concerns with the prescription medicine (does not control pain, nausea, vomiting, rash, itching, etc), please call us [phone] to see if we need to switch you to a different pain medicine that will work better for you and/or control your side effect better. ii. If you need a refill on your pain medication, please contact your pharmacy.  They will contact our office to request authorization. Prescriptions will not be filled after 5 pm or on week-ends.  4. Avoid getting constipated.  Between the surgery and the pain medications, it is common to experience some constipation.  Increasing fluid intake and taking a fiber supplement (such as Metamucil, Citrucel, FiberCon, MiraLax, etc) 1-2 times a day regularly will usually help prevent this problem from occurring.  A mild laxative (prune juice, Milk of Magnesia, MiraLax, etc) should be taken according to package directions if there are no bowel movements after 48 hours.    5. Wash / shower every day, starting 2 days after surgery.  You may shower over the steri strips which are waterproof.    6. Remove your  outer bandage 2 days after surgery.  You may leave the incision open to air.  You may replace a dressing/Band-Aid to cover an incision for comfort if you wish.  Continue to shower over incision(s) after the dressing is off.  7. ACTIVITIES as tolerated:   a. You may resume regular (light) daily activities beginning the next day--such as daily self-care, walking, climbing stairs--gradually increasing activities as  tolerated.  Control your pain so that you can walk an hour a day.  If you can walk 30 minutes without difficulty, it is safe to try more intense activity such as jogging, treadmill, bicycling, low-impact aerobics, swimming, etc. b. Refrain from the most intensive and strenuous activity such as sit-ups, heavy lifting, contact sports, etc  Refrain from any heavy lifting or straining until 6 weeks after surgery.   c. DO NOT PUSH THROUGH PAIN.  Let pain be your guide: If it hurts to do something, don't do it.  Pain is your body warning you to avoid that activity for another week until the pain goes down. d. You may drive when you are no longer taking prescription pain medication, you can comfortably wear a seatbelt, and you can safely maneuver your car and apply brakes. e. Brandon Frye may have sexual intercourse when it is comfortable.   8. FOLLOW UP in our office a. Please call [facility] at [phone] to set up an appointment to see your surgeon in the office for a follow-up appointment approximately 2-3 weeks after your surgery. b. Make sure that you call for this appointment the day you arrive home to insure a convenient appointment time.  9.  If you have disability of FMLA / Family leave forms, please bring the forms to the office for processing.  (do not give to your surgeon).  WHEN TO CALL us [phone]: 1. Poor pain control 2. Reactions / problems with new medications (rash/itching, nausea, etc)  3. Fever over 101.5 F (38.5 C) 4. Inability to urinate 5. Nausea and/or vomiting 6. Worsening swelling or bruising 7. Continued bleeding from incision. 8. Increased pain, redness, or drainage from the incision   The clinic staff is available to answer your questions during regular business hours (8:30am-5pm).  Please don't hesitate to call and ask to speak to one of our nurses for clinical concerns.   If you have a medical emergency, go to the nearest emergency room or call 911.  A surgeon from [facility] is always on call at the hospitals in [facility], [place] [address], [address], [place], [place]  [address] ?  [address] [address], [place], [place]   [address] MAIN: [phone] ? TOLL FREE: [phone] ? FAX: [phone] [email or web address]   General Anesthesia, Adult, Care After This sheet gives you information about how to care for yourself after your procedure. Your health care provider may also give you more specific instructions. If you have problems or questions, contact your health care provider. What can I expect after the procedure? After the procedure, the following side effects are common:  Pain or discomfort at the IV site.  Nausea.  Vomiting.  Sore throat.  Trouble concentrating.  Feeling cold or chills.  Weak or tired.  Sleepiness and fatigue.  Soreness and body aches. These side effects can affect parts of the body that were not involved in surgery. Follow these instructions at home:  For at least 24 hours after the procedure:  Have a responsible adult stay with you.  It is important to have someone help care for you until you are awake and alert.  Rest as needed.  Do not: ? Participate in activities in which you could fall or become injured. ? Drive. ? Use heavy machinery. ? Drink alcohol. ? Take sleeping pills or medicines that cause drowsiness. ? Make important decisions or sign legal documents. ? Take care of children on your own. Eating and drinking  Follow any instructions from your health care provider about eating or drinking restrictions.  When you feel hungry, start by eating small amounts of foods that are soft and easy to digest (bland), such as toast. Gradually return to your regular diet.  Drink enough fluid to keep your urine pale yellow.  If you vomit, rehydrate by drinking water, juice, or clear broth. General instructions  If you have sleep apnea, surgery and certain  medicines can increase your risk for breathing problems. Follow instructions from your health care provider about wearing your sleep device: ? Anytime you are sleeping, including during daytime naps. ? While taking prescription pain medicines, sleeping medicines, or medicines that make you drowsy.  Return to your normal activities as told by your health care provider. Ask your health care provider what activities are safe for you.  Take over-the-counter and prescription medicines only as told by your health care provider.  If you smoke, do not smoke without supervision.  Keep all follow-up visits as told by your health care provider. This is important. Contact a health care provider if:  You have nausea or vomiting that does not get better with medicine.  You cannot eat or drink without vomiting.  You have pain that does not get better with medicine.  You are unable to pass urine.  You develop a skin rash.  You have a fever.  You have redness around your IV site that gets worse. Get help right away if:  You have difficulty breathing.  You have chest pain.  You have blood in your urine or stool, or you vomit blood. Summary  After the procedure, it is common to have a sore throat or nausea. It is also common to feel tired.  Have a responsible adult stay with you for the first 24 hours after general anesthesia. It is important to have someone help care for you until you are awake and alert.  When you feel hungry, start by eating small amounts of foods that are soft and easy to digest (bland), such as toast. Gradually return to your regular diet.  Drink enough fluid to keep your urine pale yellow.  Return to your normal activities as told by your health care provider. Ask your health care provider what activities are safe for you. This information is not intended to replace advice given to you by your health care provider. Make sure you discuss any questions you have with your  health care provider. Document Revised: 10/23/2017 Document Reviewed: 06/05/2017 Elsevier Patient Education  2020 [employer].

## 2020-06-11 NOTE — Transfer of Care (Signed)
Immediate Anesthesia Transfer of Care Note  Patient: Brandon Frye  Procedure(s) Performed: OPEN RIGHT INGUINAL HERNIA REPAIR WITH TAP BLOCK (Right Inguinal) INSERTION OF MESH (Right Inguinal)  Patient Location: PACU  Anesthesia Type:General  Level of Consciousness: awake, alert  and oriented  Airway & Oxygen Therapy: Patient Spontanous Breathing and Patient connected to face mask oxygen  Post-op Assessment: Report given to RN and Post -op Vital signs reviewed and stable  Post vital signs: Reviewed and stable  Last Vitals:  Vitals Value Taken Time  BP    Temp    Pulse    Resp    SpO2      Last Pain:  Vitals:   06/11/20 0902  TempSrc:   PainSc: 0-No pain         Complications: No complications documented.

## 2020-06-11 NOTE — Op Note (Signed)
Operative Note  Brandon Frye  [id number]  [id number]  06/11/2020   Surgeon: Berna Bue MD FACS  Procedure performed: Open right inguinal hernia repair with mesh   Preop diagnosis:  right inguinal hernia   Post-op diagnosis/intraop findings: indirect inguinal hernia   Specimens: none   EBL: 5cc   Complications: none   Description of procedure: After obtaining informed consent the patient was taken to the operating room and placed supine on operating room table where general anesthesia was initiated, preoperative antibiotics were administered, SCDs applied, and a formal timeout was performed.  Foley catheter is inserted which is removed at the end of the case.  The groin was clipped, prepped and draped in the usual sterile fashion. An oblique incision was made in the just above the inguinal ligament after infiltrating the tissues with local anesthetic (Exparel mixed with quarter percent Marcaine with epinephrine). Soft tissues were dissected using electrocautery until the external oblique aponeurosis was encountered. This was divided sharply to expand the external ring. A plane was bluntly developed between the spermatic cord and the external oblique. The ilioinguinal nerve was identified, divided between hemostats, and each end ligated with 3-0 Vicryl ties. The spermatic cord was then bluntly dissected away from the pubic tubercle and encircled with a Penrose. Inspection of the inguinal anatomy revealed a moderate indirect hernia sac which was somewhat thickened. The indirect hernia sac was bluntly dissected away from the cord structures.   The sac was reduced into the abdomen and the internal ring was narrowed and inguinal floor reconstructed with interrupted 0 Vicryl sutures to a diameter just large enough to permit passage of the spermatic cord.  A 3 x 6 piece of ultra Pro mesh was brought onto the field and trimmed to approximate the field. This was secured to the pubic tubercle  fascia using 0 ethibond. Interrupted 0 ethibonds were then used to suture the mesh to the inferior shelving edge and to the internal oblique superiorly. The tails of the mesh were wrapped around the spermatic cord, ensuring adequate room for the cord, and sutured to each other with 0 ethibond, and then directed laterally to lie flat. Hemostasis was ensured within the wound. The Penrose was removed. The external oblique aponeurosis was reapproximated with a running 3-0 Vicryl to re-create a narrowed external ring. More local was infiltrated around the pubic tubercle and in the plane just below the external oblique. The Scarpa's was reapproximated with interrupted 3-0 Vicryls. The skin was closed with a running subcuticular Monocryl. The remainder of the local was injected in the subcutaneous and subcuticular space. The field was then cleaned, benzoin and Steri-Strips and sterile bandage were applied. Both testicles were palpated in the scrotum at the end of the case. The patient was then awakened extubated and taken to PACU in stable condition.    All counts were correct at the completion of the case

## 2020-06-11 NOTE — Anesthesia Procedure Notes (Signed)
Procedure Name: Intubation Date/Time: 06/11/2020 10:40 AM Performed by: Maxwell Caul, CRNA Pre-anesthesia Checklist: Patient identified, Emergency Drugs available, Suction available and Patient being monitored Patient Re-evaluated:Patient Re-evaluated prior to induction Oxygen Delivery Method: Circle system utilized Preoxygenation: Pre-oxygenation with 100% oxygen Induction Type: IV induction Ventilation: Mask ventilation without difficulty Laryngoscope Size: Mac and 4 Grade View: Grade I Tube type: Oral Tube size: 7.5 mm Number of attempts: 1 Airway Equipment and Method: Stylet Placement Confirmation: ETT inserted through vocal cords under direct vision,  positive ETCO2 and breath sounds checked- equal and bilateral Secured at: 21 cm Tube secured with: Tape Dental Injury: Teeth and Oropharynx as per pre-operative assessment

## 2020-06-11 NOTE — Anesthesia Postprocedure Evaluation (Signed)
Anesthesia Post Note  Patient: Scientist, water quality  Procedure(s) Performed: OPEN RIGHT INGUINAL HERNIA REPAIR WITH TAP BLOCK (Right Inguinal) INSERTION OF MESH (Right Inguinal)     Patient location during evaluation: PACU Anesthesia Type: General Level of consciousness: sedated and patient cooperative Pain management: pain level controlled Vital Signs Assessment: post-procedure vital signs reviewed and stable Respiratory status: spontaneous breathing Cardiovascular status: stable Anesthetic complications: no   No complications documented.  Last Vitals:  Vitals:   06/11/20 1345 06/11/20 1409  BP: 125/69 113/77  Pulse: 65 71  Resp: 16 16  Temp: 36.8 C   SpO2: 95% 98%    Last Pain:  Vitals:   06/11/20 1409  TempSrc:   PainSc: 1                  Lewie Loron

## 2020-06-12 ENCOUNTER — Encounter (HOSPITAL_COMMUNITY): Payer: Self-pay | Admitting: Surgery

## 2020-06-18 ENCOUNTER — Telehealth: Payer: Self-pay

## 2020-06-18 NOTE — Telephone Encounter (Signed)
[facility] [place]

## 2020-07-13 ENCOUNTER — Other Ambulatory Visit: Payer: Self-pay

## 2020-07-13 ENCOUNTER — Ambulatory Visit (HOSPITAL_COMMUNITY)
Admission: RE | Admit: 2020-07-13 | Discharge: 2020-07-13 | Disposition: A | Discharge status: Home / Self Care | Payer: Medicare Other | Source: Ambulatory Visit | Attending: Cardiology | Admitting: Cardiology

## 2020-07-13 ENCOUNTER — Other Ambulatory Visit (HOSPITAL_COMMUNITY): Payer: Self-pay | Admitting: Cardiology

## 2020-07-13 ENCOUNTER — Encounter (HOSPITAL_COMMUNITY): Payer: Self-pay | Admitting: Cardiology

## 2020-07-13 VITALS — BP 104/68 | HR 74 | Ht 73.0 in | Wt 176.0 lb

## 2020-07-13 DIAGNOSIS — Z8249 Family history of ischemic heart disease and other diseases of the circulatory system: Secondary | ICD-10-CM | POA: Diagnosis not present

## 2020-07-13 DIAGNOSIS — Z79899 Other long term (current) drug therapy: Secondary | ICD-10-CM | POA: Insufficient documentation

## 2020-07-13 DIAGNOSIS — Z7984 Long term (current) use of oral hypoglycemic drugs: Secondary | ICD-10-CM | POA: Insufficient documentation

## 2020-07-13 DIAGNOSIS — J449 Chronic obstructive pulmonary disease, unspecified: Secondary | ICD-10-CM | POA: Insufficient documentation

## 2020-07-13 DIAGNOSIS — Z86718 Personal history of other venous thrombosis and embolism: Secondary | ICD-10-CM | POA: Insufficient documentation

## 2020-07-13 DIAGNOSIS — I110 Hypertensive heart disease with heart failure: Secondary | ICD-10-CM | POA: Insufficient documentation

## 2020-07-13 DIAGNOSIS — Z7982 Long term (current) use of aspirin: Secondary | ICD-10-CM | POA: Insufficient documentation

## 2020-07-13 DIAGNOSIS — F17210 Nicotine dependence, cigarettes, uncomplicated: Secondary | ICD-10-CM | POA: Diagnosis not present

## 2020-07-13 DIAGNOSIS — I2510 Atherosclerotic heart disease of native coronary artery without angina pectoris: Secondary | ICD-10-CM | POA: Insufficient documentation

## 2020-07-13 DIAGNOSIS — I428 Other cardiomyopathies: Secondary | ICD-10-CM | POA: Diagnosis not present

## 2020-07-13 DIAGNOSIS — Z7901 Long term (current) use of anticoagulants: Secondary | ICD-10-CM | POA: Insufficient documentation

## 2020-07-13 DIAGNOSIS — I5022 Chronic systolic (congestive) heart failure: Secondary | ICD-10-CM | POA: Diagnosis not present

## 2020-07-13 LAB — BASIC METABOLIC PANEL
Anion gap: 12 (ref 5–15)
BUN: 22 mg/dL (ref 8–23)
CO2: 20 mmol/L — ABNORMAL LOW (ref 22–32)
Calcium: 9.2 mg/dL (ref 8.9–10.3)
Chloride: 106 mmol/L (ref 98–111)
Creatinine, Ser: 1.14 mg/dL (ref 0.61–1.24)
GFR calc Af Amer: >60 mL/min (ref >60)
GFR calc non Af Amer: >60 mL/min (ref >60)
Glucose, Bld: 78 mg/dL (ref 70–99)
Potassium: 4 mmol/L (ref 3.5–5.1)
Sodium: 138 mmol/L (ref 135–145)

## 2020-07-13 MED ORDER — DAPAGLIFLOZIN PROPANEDIOL 10 MG PO TABS: 10.0000 mg | ORAL_TABLET | Freq: Every day | ORAL | 11 refills | Status: DC

## 2020-07-13 NOTE — Patient Instructions (Signed)
STOP Lasix START Farxiga 10 mg, one tab daily  Labs today We will only contact you if something comes back abnormal or we need to make some changes. Otherwise no news is good news!   Labs needed in 10-14 days  Your physician recommends that you schedule a follow-up appointment in: 3 months with DR Shirlee Latch and echo   Your physician has requested that you have an echocardiogram. Echocardiography is a painless test that uses sound waves to create images of your heart. It provides your doctor with information about the size and shape of your heart and how well your hearts chambers and valves are working. This procedure takes approximately one hour. There are no restrictions for this procedure.  If you have any questions or concerns before your next appointment please send [place] a message through [place] or call our office at [phone].    TO LEAVE A MESSAGE FOR THE NURSE SELECT OPTION 2, PLEASE LEAVE A MESSAGE INCLUDING:  YOUR NAME  DATE OF BIRTH  CALL BACK NUMBER  REASON FOR CALL**this is important as we prioritize the call backs  YOU WILL RECEIVE A CALL BACK THE SAME DAY AS LONG AS YOU CALL BEFORE 4:00 PM

## 2020-07-14 LAB — MISC LABCORP TEST (SEND OUT): Labcorp test code: 143000

## 2020-07-14 NOTE — Progress Notes (Signed)
Advanced Heart Failure Clinic Note   Referring Physician: PCP: [facility] PCP-Cardiologist: Loralie Champagne, MD   74 y.o. presenting to clinic today for followup of chronic systolic heart failure.  Has a prior h/o DVT, COPD, and HTN.  He also had a nonischemic cardiomyopathy diagnosed in 2017 in [place], [place] was 30% with nonobstructive coronary disease on cath at that time.    Patient now lives in [place] and gets care at the [facility].  He was evaluated in 2/21 for syncope. He had  passed out in his house and woke up on the floor.  He was seen at the [place] [facility] and had an echo and a 2 week Zio patch. The echo showed EF 25-30% with bullseye strain pattern and dilated IVC.  The Zio patch showed around 40 episodes of NSVT, the longest was 9 seconds.  When [facility] returned[facility]. On arrival to [facility], he endorsed 2-3 weeks of orthopnea and exertional dyspnea, NYHA III symptoms. He denied chest pain and no further further syncopal episodes. HS-TnI in 20s range with no trend. On exam he was volume overloaded w/ elevated BP. He was admitted and treated w/ IV diuretics. HF meds adjusted. Entresto added. He was set up for [facility] which showed nonobstructive CAD, good filling pressures after diuresis, and low cardiac index at 1.8.   Cardiac MRI showed LV EF 16%, RVEF 31%, and non-coronary LGE pattern, primarily mid-wall septal involvement that could be consistent with prior myocarditis or sarcoidosis, less likely cardiac amyloidosis.  PYP scan negative.  CT chest w/o evidence for sarcoidosis and ACE level normal. Multiple myeloma panel negative for M spike protein.   EP was consulted and recommended ICD placement. Had Medtronic device implanted on 12/26/19 by Dr. Caryl Comes.   He had an uncomplicated right inguinal hernia repair in 8/21.   He presents today for followup of CHF.  He is off spironolactone due to persistent hyperkalemia and painful  gynecomastia, which has not yet fully resolved.  Rare lightheadedness with standing.  No chest pain.  No dyspnea walking on flat ground or up a flight of stairs.  No orthopnea/PND.  He has been taking Lasix 20 mg daily (not prn).    Labs (7/21): K 6.6 Labs (8/21): K 4.9, creatinine 1.2  Review of Systems: All systems reviewed and negative except as per HPI.   PMH: 1. DVT 2. COPD: Current smoker.  3. Right inguinal hernia s/p repair.  4. Chronic systolic CHF: Nonischemic cardiomyopathy. Has Medtronic ICD.  - [facility] EF 25-30% with bullseye strain pattern and dilated IVC. - Cardiac MRI (2/21): Cardiac MRI showed LV EF 16%, RVEF 31%, and non-coronary LGE pattern, primarily mid-wall septal involvement that could be consistent with prior myocarditis or sarcoidosis, less likely cardiac amyloidosis.  - PYP scan (2/21): Not suggestive of TTR amyloidosis.  Myeloma panel negative.  - CT chest (2/21): No evidence for pulmonary sarcoidosis.  - RHC/LHC (2/21): Nonobstructive mild CAD; mean RA 1, PA 36/7, mean PCWP 8, CI 1.8.  5. H/o NSVT and syncope   Current Outpatient Medications  Medication Sig Dispense Refill  . albuterol (VENTOLIN HFA) 108 (90 Base) MCG/ACT inhaler Inhale 2 puffs into the lungs every 6 (six) hours as needed for wheezing or shortness of breath.     [name] aspirin EC 81 MG tablet Take 81 mg by mouth daily.    . carvedilol (COREG) 12.5 MG tablet Take 1  tablet (12.5 mg total) by mouth 2 (two) times daily with a meal. 180 tablet 3  . Ipratropium-Albuterol (COMBIVENT RESPIMAT) 20-100 MCG/ACT AERS respimat Inhale 1 puff into the lungs every 6 (six) hours as needed for wheezing or shortness of breath (or coughing). 1 Inhaler 0  . rivaroxaban (XARELTO) 10 MG TABS tablet Take 1 tablet (10 mg total) by mouth daily. Start 12/29/19 90 tablet 3  . sacubitril-valsartan (ENTRESTO) 49-51 MG Take 1 tablet by mouth 2 (two) times daily. 180 tablet 3  . dapagliflozin propanediol (FARXIGA) 10 MG  TABS tablet Take 1 tablet (10 mg total) by mouth daily before breakfast. 30 tablet 11  . spironolactone (ALDACTONE) 25 MG tablet Take 1 tablet (25 mg total) by mouth daily. (Patient not taking: Reported on 04/12/2020) 90 tablet 3   No current facility-administered medications for this encounter.    Allergies  Allergen Reactions  . Lisinopril Cough      Social History   Socioeconomic History  . Marital status: Single    Spouse name: Not on file  . Number of children: Not on file  . Years of education: Not on file  . Highest education level: Not on file  Occupational History  . Not on file  Tobacco Use  . Smoking status: Current Every Day Smoker    Packs/day: 1.00    Years: 25.00    Pack years: 25.00    Types: Cigarettes  . Smokeless tobacco: Never Used  Vaping Use  . Vaping Use: Never used  Substance and Sexual Activity  . Alcohol use: Yes    Comment: occasional  . Drug use: Yes    Types: Marijuana  . Sexual activity: Not Currently    Birth control/protection: None  Other Topics Concern  . Not on file  Social History Narrative  . Not on file   Social Determinants of Health   Financial Resource Strain:   . Difficulty of Paying Living Expenses: Not on file  Food Insecurity:   . Worried About Charity fundraiser in the Last Year: Not on file  . Ran Out of Food in the Last Year: Not on file  Transportation Needs:   . Lack of Transportation (Medical): Not on file  . Lack of Transportation (Non-Medical): Not on file  Physical Activity:   . Days of Exercise per Week: Not on file  . Minutes of Exercise per Session: Not on file  Stress:   . Feeling of Stress : Not on file  Social Connections:   . Frequency of Communication with Friends and Family: Not on file  . Frequency of Social Gatherings with Friends and Family: Not on file  . Attends Religious Services: Not on file  . Active Member of Clubs or Organizations: Not on file  . Attends Archivist  Meetings: Not on file  . Marital Status: Not on file  Intimate Partner Violence:   . Fear of Current or Ex-Partner: Not on file  . Emotionally Abused: Not on file  . Physically Abused: Not on file  . Sexually Abused: Not on file      Family History  Problem Relation Age of Onset  . Stroke Mother        Multiple strokes, died at 71  . Heart attack Father 50    Vitals:   07/13/20 1048  BP: 104/68  Pulse: 74  SpO2: 97%  Weight: 79.8 kg (176 lb)  Height: 6' 1"  (1.854 m)    PHYSICAL EXAM: General: NAD  Neck: No JVD, no thyromegaly or thyroid nodule.  Lungs: Clear to auscultation bilaterally with normal respiratory effort. CV: Nondisplaced PMI.  Heart regular S1/S2, no S3/S4, no murmur.  No peripheral edema.  No carotid bruit.  Normal pedal pulses.  Abdomen: Soft, nontender, no hepatosplenomegaly, no distention.  Skin: Intact without lesions or rashes.  Neurologic: Alert and oriented x 3.  Psych: Normal affect. Extremities: No clubbing or cyanosis.  HEENT: Normal.   ASSESSMENT & PLAN:  1. Chronic systolic CHF:  Nonischemic cardiomyopathy with Medtronic ICD.  Cath in 2017 showed nonobstructive CAD in setting of EF 30%. Echo in 2/21 at the [facility] showed EF 25-30% with bullseye pattern on strain imaging concerning for amyloidosis. No family history of cardiomyopathy but father had MI at age 68. RHC/LHC in 2/21 showed nonobstructive CAD, good filling pressures after diuresis, and low cardiac index at 1.8.   Cardiac MRI showed LV EF 16%, RVEF 31%, and non-coronary LGE pattern, primarily mid-wall septal involvement that could be consistent with prior myocarditis or sarcoidosis, less likely cardiac amyloidosis.  PYP scan negative.  CT chest w/o evidence for sarcoidosis and ACE level normal.  Multiple myeloma panel negative.  NYHA class II symptoms, he is not volume overloaded on exam.  - Continue Coreg 12.5 mg bid.  - Continue Entresto 49-51 twice daily - He is off spironolactone with  hyperkalemia and painful gynecomastia.  Consider starting eplerenone in the future but would likely need to use Veltassa with it.  - Stop Lasix, start dapagliflozin 10 mg daily.  - Echo + followup in 3 months.  2. H/o Syncope and NSVT: Now s/p MDT ICD. No further syncope.   - Continue  Coreg 12.5 mg bid 3. 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 .    Followup 3 months with echo.   Loralie Champagne, MD 07/14/20

## 2020-07-26 ENCOUNTER — Other Ambulatory Visit: Payer: Self-pay

## 2020-07-26 ENCOUNTER — Ambulatory Visit (HOSPITAL_COMMUNITY)
Admission: RE | Admit: 2020-07-26 | Discharge: 2020-07-26 | Disposition: A | Discharge status: Home / Self Care | Payer: No Typology Code available for payment source | Source: Ambulatory Visit | Attending: Internal Medicine | Admitting: Internal Medicine

## 2020-07-26 ENCOUNTER — Encounter (HOSPITAL_COMMUNITY): Payer: Self-pay

## 2020-07-26 DIAGNOSIS — I5022 Chronic systolic (congestive) heart failure: Secondary | ICD-10-CM

## 2020-07-27 NOTE — Addendum Note (Signed)
Encounter addended by: Modesta Messing, CMA on: 07/27/2020 9:46 AM  Actions taken: Charge Capture section accepted

## 2020-07-30 IMAGING — NM NM TUMOR LOCAL/TRACER DISTR WITH SPECT
3 series · 18 of 18 positions shown · non-contrast
Comparison: none

CLINICAL DATA: HEART FAILURE. CONCERN FOR CARDIAC AMYLOIDOSIS.

EXAM:
NUCLEAR MEDICINE TUMOR LOCALIZATION. PYP CARDIAC AMYLOIDOSIS SCAN
WITH SPECT
TECHNIQUE: Following intravenous administration of radiopharmaceutical,
anterior planar images of the chest were obtained. Regions of
interest were placed on the heart and contralateral chest wall for
quantitative assessment. Additional SPECT imaging of the chest was
obtained.
RADIOPHARMACEUTICALS:  21.4 mCi TECHNETIUM 99 PYROPHOSPHATE

[Series 1: amyloid spect_(id)_tra · 4.1mm · 4.14mm/px · 6 of 128 frames shown]
[frame 11/128]
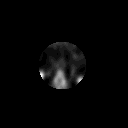
[frame 32/128]
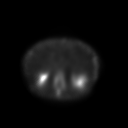
[frame 54/128]
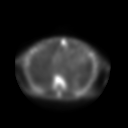
[frame 75/128]
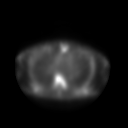
[frame 96/128]
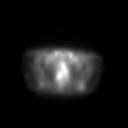
[frame 118/128]
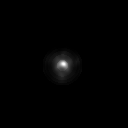

[Series 1: amyloid spect_(id)_cor · 4.1mm · 4.14mm/px · 6 of 128 frames shown]
[frame 11/128]
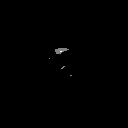
[frame 32/128]
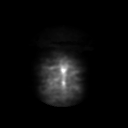
[frame 54/128]
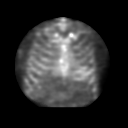
[frame 75/128]
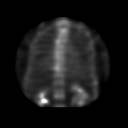
[frame 96/128]
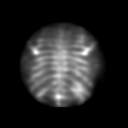
[frame 118/128]
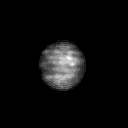

[Series 2: amyloid spect · 4.14mm/px · 6 of 64 frames shown]
[frame 6/64]
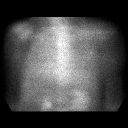
[frame 16/64]
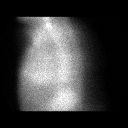
[frame 27/64]
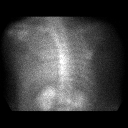
[frame 38/64]
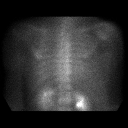
[frame 48/64]
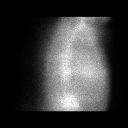
[frame 59/64]
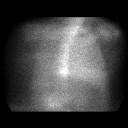

[18 of 18 positions shown; findings below may reference images not displayed]

FINDINGS: Planar Visual assessment:

Anterior planar imaging demonstrates radiotracer uptake within the
heart less than uptake within the adjacent ribs (Grade 1).

Quantitative assessment :

Quantitative assessment of the cardiac uptake compared to the
contralateral chest wall is equal to 1.2 (H/CL = 1.2).

SPECT assessment: SPECT imaging of the chest demonstrates minimal
radiotracer accumulation within the LEFT ventricle.
IMPRESSION: Visual and quantitative assessment (grade 1.2, H/CL equal 1) are NOT
suggestive of transthyretin amyloidosis.

## 2020-08-01 IMAGING — DX DG CHEST 2V
2 series · 2 of 2 positions shown · non-contrast
Comparison: 12/21/2019

CLINICAL DATA: Postop from AICD placement.

EXAM:
CHEST - 2 VIEW

[chest pa]
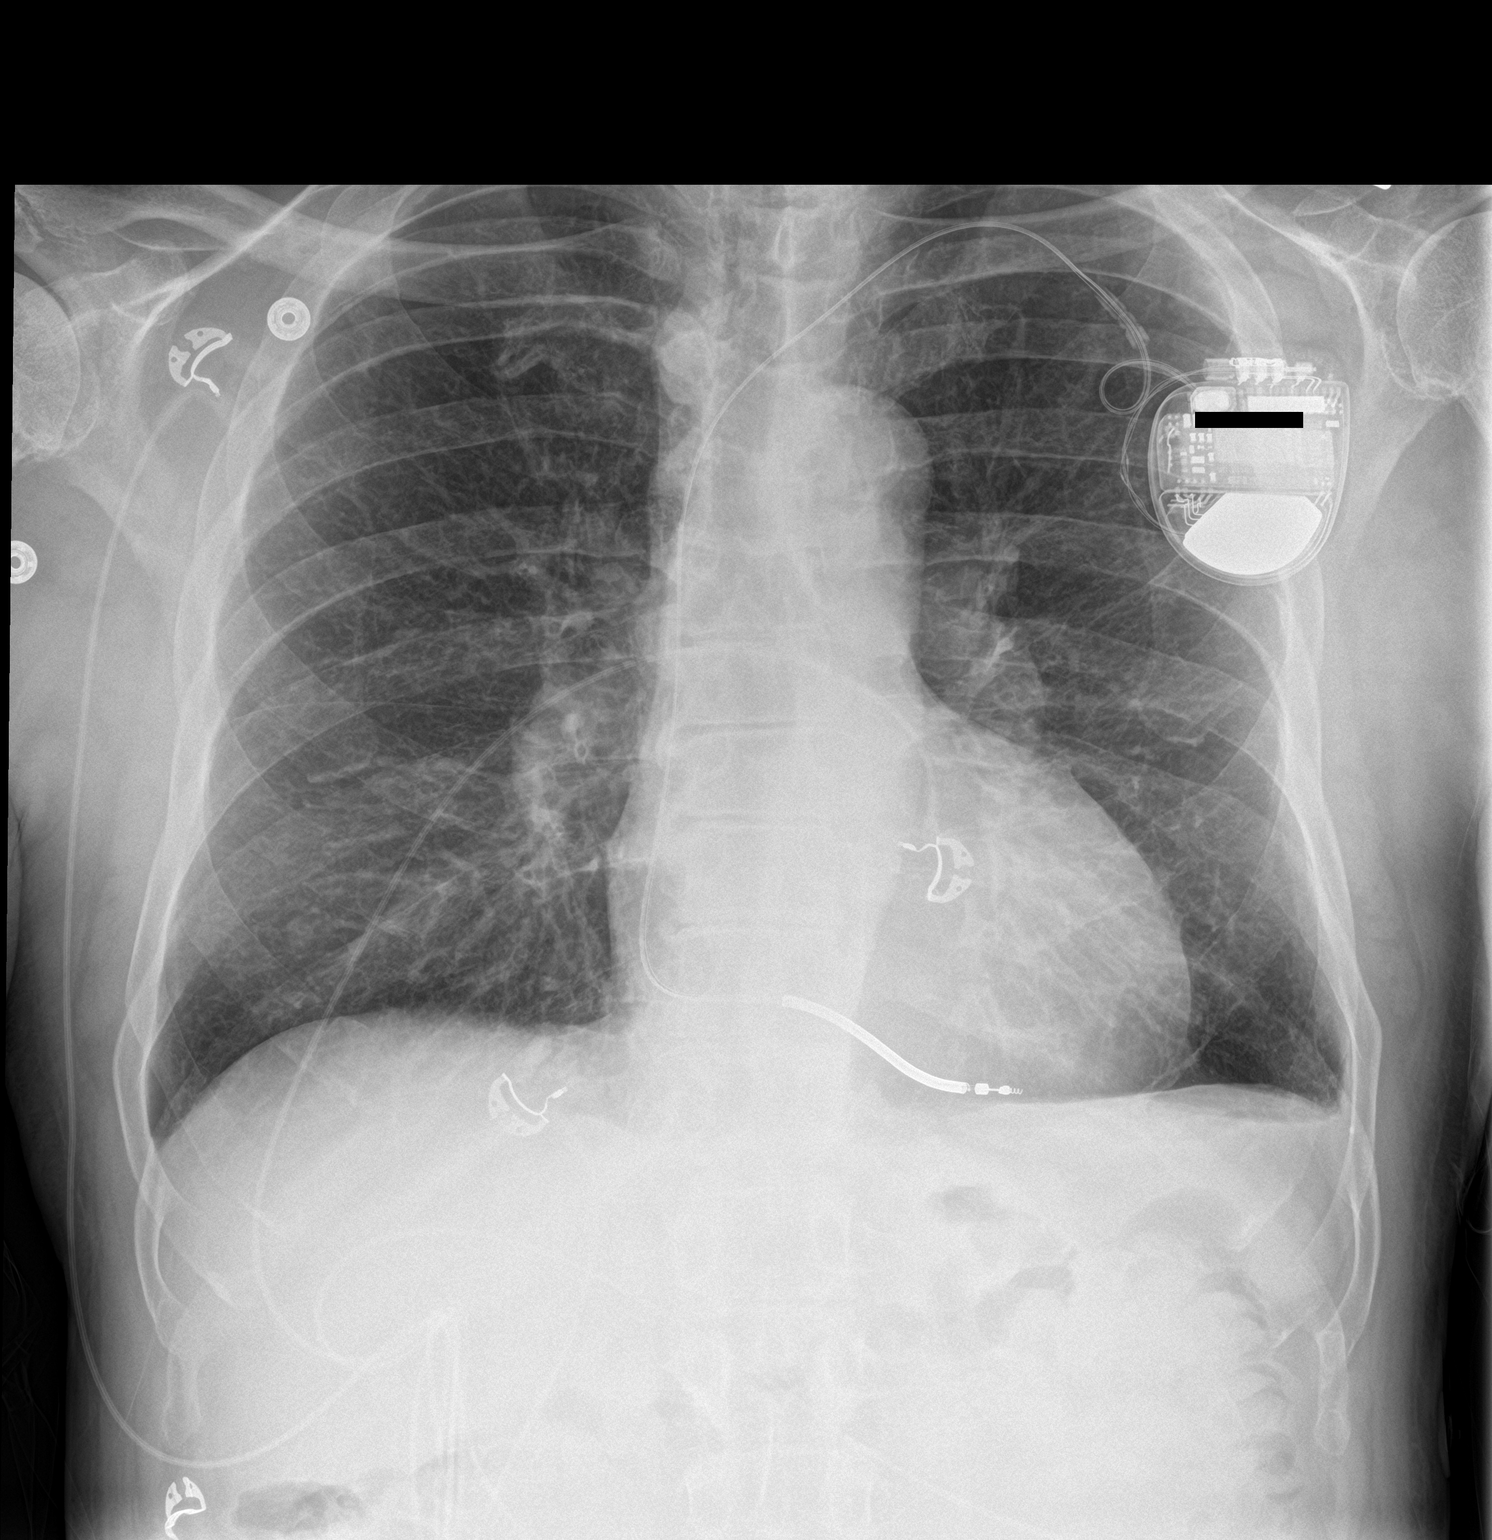

[chest lat]
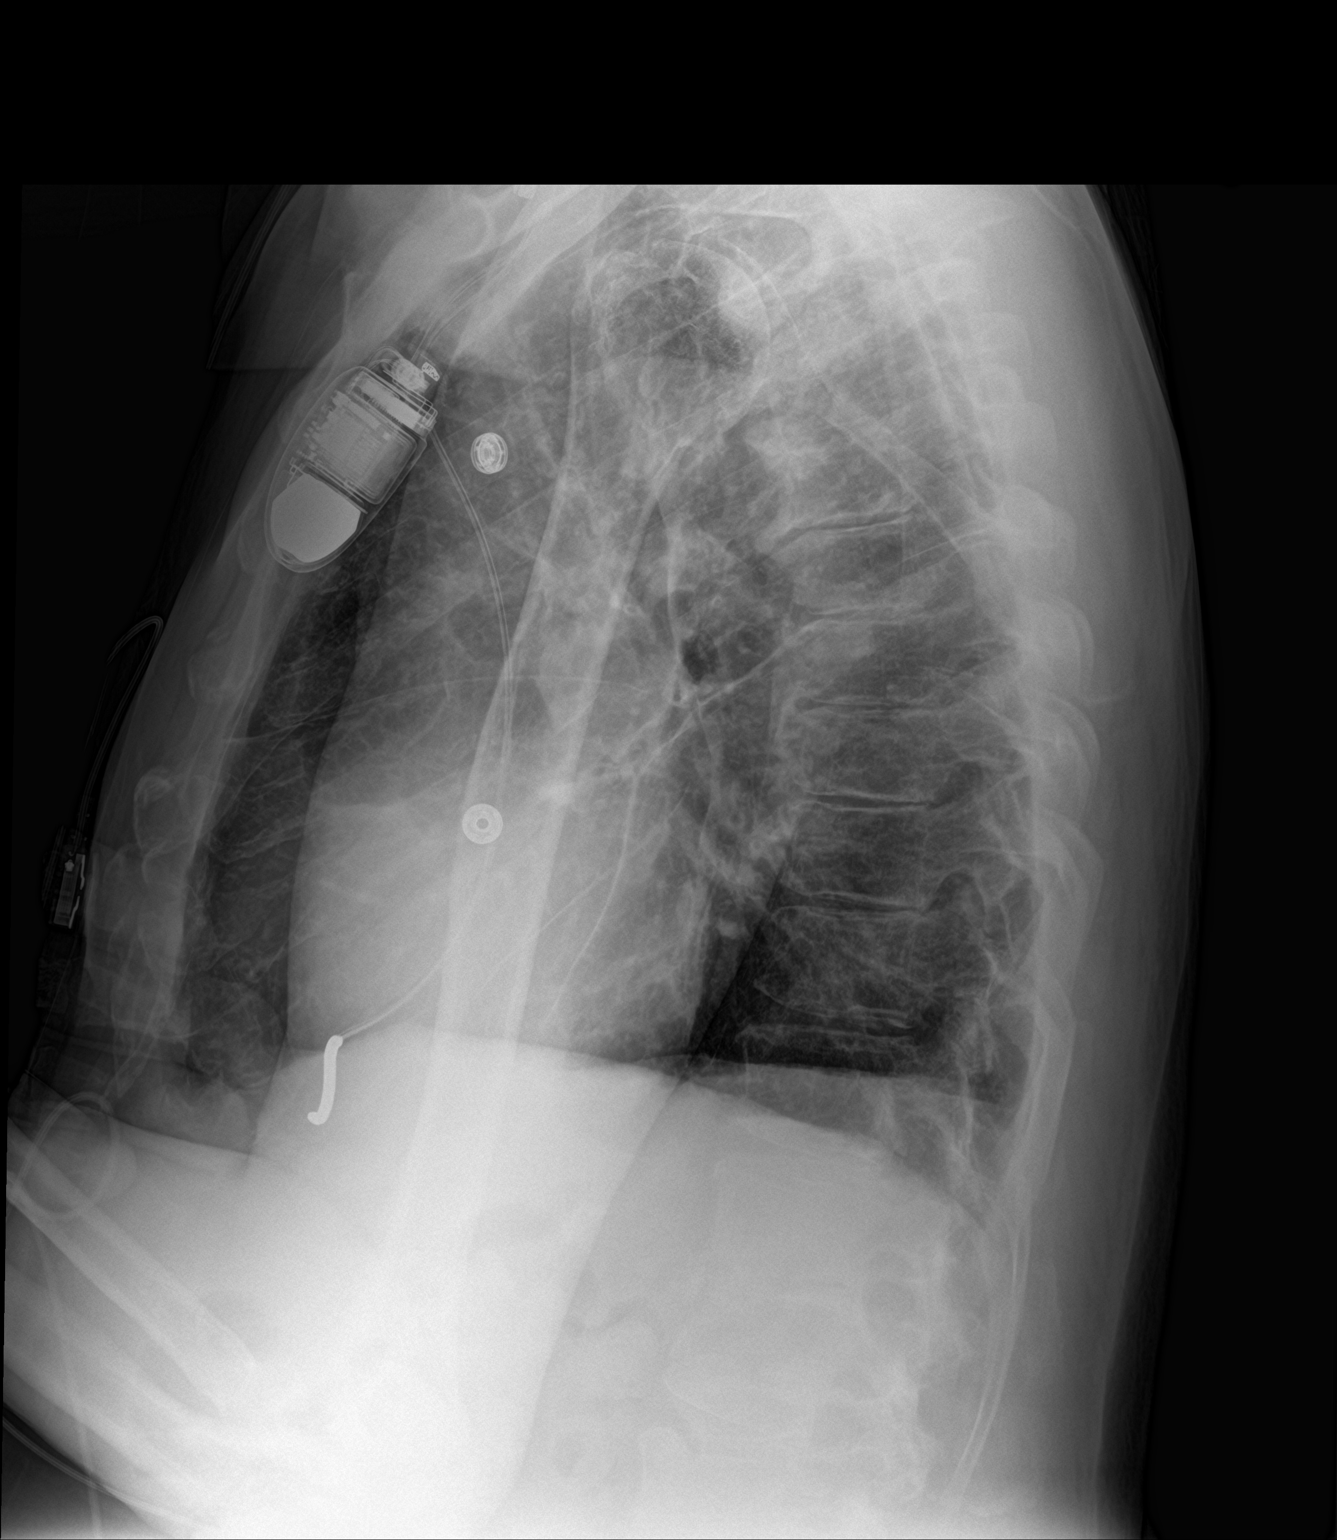

[2 of 2 positions shown; findings below may reference images not displayed]

FINDINGS: Pain new single lead AICD is seen in place with tip within the right
ventricle. No evidence of pneumothorax or pleural effusion. Both
lungs are clear. Mild pleural thickening at left lung base remains
stable. Heart size is normal. Aortic atherosclerosis incidentally
noted.
IMPRESSION: New AICD in appropriate position. No evidence of pneumothorax or
other acute findings.

## 2020-10-16 ENCOUNTER — Other Ambulatory Visit: Payer: Self-pay

## 2020-10-16 ENCOUNTER — Encounter (HOSPITAL_COMMUNITY): Payer: Self-pay | Admitting: Cardiology

## 2020-10-16 ENCOUNTER — Ambulatory Visit (HOSPITAL_COMMUNITY)
Admission: RE | Admit: 2020-10-16 | Discharge: 2020-10-16 | Disposition: A | Discharge status: Home / Self Care | Payer: Medicare Other | Source: Ambulatory Visit | Attending: Cardiology | Admitting: Cardiology

## 2020-10-16 ENCOUNTER — Ambulatory Visit (HOSPITAL_BASED_OUTPATIENT_CLINIC_OR_DEPARTMENT_OTHER)
Admission: RE | Admit: 2020-10-16 | Discharge: 2020-10-16 | Disposition: A | Discharge status: Home / Self Care | Payer: Medicare Other | Source: Ambulatory Visit | Attending: Cardiology | Admitting: Cardiology

## 2020-10-16 VITALS — BP 106/72 | HR 67 | Wt 169.0 lb

## 2020-10-16 DIAGNOSIS — I5022 Chronic systolic (congestive) heart failure: Secondary | ICD-10-CM | POA: Diagnosis not present

## 2020-10-16 DIAGNOSIS — I5043 Acute on chronic combined systolic (congestive) and diastolic (congestive) heart failure: Secondary | ICD-10-CM

## 2020-10-16 DIAGNOSIS — J449 Chronic obstructive pulmonary disease, unspecified: Secondary | ICD-10-CM | POA: Insufficient documentation

## 2020-10-16 DIAGNOSIS — I493 Ventricular premature depolarization: Secondary | ICD-10-CM | POA: Insufficient documentation

## 2020-10-16 DIAGNOSIS — I700 Atherosclerosis of aorta: Secondary | ICD-10-CM | POA: Diagnosis not present

## 2020-10-16 DIAGNOSIS — Z95810 Presence of automatic (implantable) cardiac defibrillator: Secondary | ICD-10-CM | POA: Insufficient documentation

## 2020-10-16 DIAGNOSIS — I509 Heart failure, unspecified: Secondary | ICD-10-CM | POA: Insufficient documentation

## 2020-10-16 LAB — MAGNESIUM: Magnesium: 2.2 mg/dL (ref 1.7–2.4)

## 2020-10-16 LAB — BASIC METABOLIC PANEL
Anion gap: 15 (ref 5–15)
BUN: 22 mg/dL (ref 8–23)
CO2: 23 mmol/L (ref 22–32)
Calcium: 10.1 mg/dL (ref 8.9–10.3)
Chloride: 101 mmol/L (ref 98–111)
Creatinine, Ser: 1.46 mg/dL — ABNORMAL HIGH (ref 0.61–1.24)
GFR, Estimated: 51 mL/min — ABNORMAL LOW (ref >60)
Glucose, Bld: 102 mg/dL — ABNORMAL HIGH (ref 70–99)
Potassium: 5 mmol/L (ref 3.5–5.1)
Sodium: 139 mmol/L (ref 135–145)

## 2020-10-16 LAB — CBC
HCT: 52.7 % — ABNORMAL HIGH (ref 39.0–52.0)
Hemoglobin: 17.7 g/dL — ABNORMAL HIGH (ref 13.0–17.0)
MCH: 29.3 pg (ref 26.0–34.0)
MCHC: 33.6 g/dL (ref 30.0–36.0)
MCV: 87.1 fL (ref 80.0–100.0)
Platelets: 225 10*3/uL (ref 150–400)
RBC: 6.05 MIL/uL — ABNORMAL HIGH (ref 4.22–5.81)
RDW: 13.9 % (ref 11.5–15.5)
WBC: 4.1 10*3/uL (ref 4.0–10.5)
nRBC: 0 % (ref 0.0–0.2)

## 2020-10-16 LAB — ECHOCARDIOGRAM COMPLETE
Area-P 1/2: 2.62 cm2
S' Lateral: 3.3 cm
Single Plane A4C EF: 46.3 %

## 2020-10-16 NOTE — Progress Notes (Signed)
Advanced Heart Failure Clinic Note   Referring Physician: PCP: [facility] PCP-Cardiologist: Loralie Champagne, MD   74 y.o. presenting to clinic today for followup of chronic systolic heart failure.  Has a prior h/o DVT, COPD, and HTN.  He also had a nonischemic cardiomyopathy diagnosed in 2017 in [place], [place] was 30% with nonobstructive coronary disease on cath at that time.    Patient now lives in [place] and gets care at the [place].  He was evaluated in 2/21 for syncope. He had  passed out in his house and woke up on the floor.  He was seen at the [place] and had an echo and a 2 week Zio patch. The echo showed EF 25-30% with bullseye strain pattern and dilated IVC.  The Zio patch showed around 40 episodes of NSVT, the longest was 9 seconds.  When the [facility] returned[facility]. On arrival to [facility], he endorsed 2-3 weeks of orthopnea and exertional dyspnea, NYHA III symptoms. He denied chest pain and no further further syncopal episodes. HS-TnI in 20s range with no trend. On exam he was volume overloaded w/ elevated BP. He was admitted and treated w/ IV diuretics. HF meds adjusted. Entresto added. He was set up for [facility] which showed nonobstructive CAD, good filling pressures after diuresis, and low cardiac index at 1.8.   Cardiac MRI showed LV EF 16%, RVEF 31%, and non-coronary LGE pattern, primarily mid-wall septal involvement that could be consistent with prior myocarditis or sarcoidosis, less likely cardiac amyloidosis.  PYP scan negative.  CT chest w/o evidence for sarcoidosis and ACE level normal. Multiple myeloma panel negative for M spike protein.   EP was consulted and recommended ICD placement. Had Medtronic device implanted on 12/26/19 by Dr. Caryl Comes.   He had an uncomplicated right inguinal hernia repair in 8/21.   Echo was done and reviewed today, EF 55% with mild LVH, normal RV.   He presents today for followup of CHF.  He is  off spironolactone due to persistent hyperkalemia and painful gynecomastia.  He has frequent PVCs noted on telemetry on echo as well as on ECG.  He does not feel palpitations.  No chest pain. No significant exertional dyspnea.  No orthopnea/PND.  Weight down 7 lbs.    Labs (7/21): K 6.6 Labs (8/21): K 4.9, creatinine 1.2 Labs (9/21): K 4, creatinine 1.14  ECG (personally reviewed): NSR with PVCs  Review of Systems: All systems reviewed and negative except as per HPI.   PMH: 1. DVT 2. COPD: Current smoker.  3. Right inguinal hernia s/p repair.  4. Chronic systolic CHF: Nonischemic cardiomyopathy. Has Medtronic ICD.  - [facility] EF 25-30% with bullseye strain pattern and dilated IVC. - Cardiac MRI (2/21): Cardiac MRI showed LV EF 16%, RVEF 31%, and non-coronary LGE pattern, primarily mid-wall septal involvement that could be consistent with prior myocarditis or sarcoidosis, less likely cardiac amyloidosis.  - PYP scan (2/21): Not suggestive of TTR amyloidosis.  Myeloma panel negative.  - CT chest (2/21): No evidence for pulmonary sarcoidosis.  - RHC/LHC (2/21): Nonobstructive mild CAD; mean RA 1, PA 36/7, mean PCWP 8, CI 1.8.  5. H/o NSVT and syncope   Current Outpatient Medications  Medication Sig Dispense Refill  . albuterol (VENTOLIN HFA) 108 (90 Base) MCG/ACT inhaler Inhale 2 puffs into the lungs every 6 (six) hours as needed for wheezing or shortness of breath.     [name]  aspirin EC 81 MG tablet Take 81 mg by mouth daily.    . carvedilol (COREG) 12.5 MG tablet Take 1 tablet (12.5 mg total) by mouth 2 (two) times daily with a meal. 180 tablet 3  . docusate sodium (COLACE) 50 MG capsule Take 50 mg by mouth daily as needed for mild constipation or moderate constipation.    . furosemide (LASIX) 20 MG tablet Take 20 mg by mouth.    . Ipratropium-Albuterol (COMBIVENT RESPIMAT) 20-100 MCG/ACT AERS respimat Inhale 1 puff into the lungs every 6 (six) hours as needed for wheezing or  shortness of breath (or coughing). 1 Inhaler 0  . rivaroxaban (XARELTO) 10 MG TABS tablet Take 1 tablet (10 mg total) by mouth daily. Start 12/29/19 90 tablet 3  . sacubitril-valsartan (ENTRESTO) 49-51 MG Take 1 tablet by mouth 2 (two) times daily. 180 tablet 3   No current facility-administered medications for this encounter.    Allergies  Allergen Reactions  . Lisinopril Cough      Social History   Socioeconomic History  . Marital status: Single    Spouse name: Not on file  . Number of children: Not on file  . Years of education: Not on file  . Highest education level: Not on file  Occupational History  . Not on file  Tobacco Use  . Smoking status: Current Every Day Smoker    Packs/day: 1.00    Years: 25.00    Pack years: 25.00    Types: Cigarettes  . Smokeless tobacco: Never Used  Vaping Use  . Vaping Use: Never used  Substance and Sexual Activity  . Alcohol use: Yes    Comment: occasional  . Drug use: Yes    Types: Marijuana  . Sexual activity: Not Currently    Birth control/protection: None  Other Topics Concern  . Not on file  Social History Narrative  . Not on file   Social Determinants of Health   Financial Resource Strain: Not on file  Food Insecurity: Not on file  Transportation Needs: Not on file  Physical Activity: Not on file  Stress: Not on file  Social Connections: Not on file  Intimate Partner Violence: Not on file      Family History  Problem Relation Age of Onset  . Stroke Mother        Multiple strokes, died at 62  . Heart attack Father 41    Vitals:   10/16/20 0901  BP: 106/72  Pulse: 67  SpO2: 97%  Weight: 76.7 kg (169 lb)    PHYSICAL EXAM: General: NAD Neck: No JVD, no thyromegaly or thyroid nodule.  Lungs: Clear to auscultation bilaterally with normal respiratory effort. CV: Nondisplaced PMI.  Heart regular S1/S2, no S3/S4, no murmur.  No peripheral edema.  No carotid bruit.  Normal pedal pulses.  Abdomen: Soft,  nontender, no hepatosplenomegaly, no distention.  Skin: Intact without lesions or rashes.  Neurologic: Alert and oriented x 3.  Psych: Normal affect. Extremities: No clubbing or cyanosis.  HEENT: Normal.   ASSESSMENT & PLAN:  1. Chronic systolic CHF:  Nonischemic cardiomyopathy with Medtronic ICD.  Cath in 2017 showed nonobstructive CAD in setting of EF 30%. [facility]-30% with bullseye pattern on strain imaging concerning for amyloidosis. No family history of cardiomyopathy but father had MI at age 41. RHC/LHC in 2/21 showed nonobstructive CAD, good filling pressures after diuresis, and low cardiac index at 1.8.   Cardiac MRI showed LV EF   16%, RVEF 31%, and non-coronary LGE pattern, primarily mid-wall septal involvement that could be consistent with prior myocarditis or sarcoidosis, less likely cardiac amyloidosis.  PYP scan negative.  CT chest w/o evidence for sarcoidosis and ACE level normal.  Multiple myeloma panel negative.  Echo today showed EF up to 55% with mild LVH.  NYHA class II symptoms, he is not volume overloaded on exam.  - Continue Coreg 12.5 mg bid.  - Continue Entresto 49-51 twice daily - He is off spironolactone with hyperkalemia and painful gynecomastia.  With normalized EF, will not start eplerenone.   - Continue Lasix 20 mg daily, check BMET today.  2. PVCs: PVCs seem frequent.  - I will arrange for 7 day Zio patch to assess PVC burden.  - Continue Coreg.  3. 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 .    Followup 4 months  Dalton McLean, MD 10/16/20 

## 2020-10-16 NOTE — Patient Instructions (Signed)
Labs done today, your results will be available in MyChart, we will contact you for abnormal readings.  Your provider has recommended that  you wear a Zio Patch for 7 days.  This monitor will record your heart rhythm for our review.  IF you have any symptoms while wearing the monitor please press the button.  If you have any issues with the patch or you notice a red or orange light on it please call the company at [phone].  Once you remove the patch please mail it back to the company as soon as possible so we can get the results.  Please call our office in March 2022 for follow up appointment  If you have any questions or concerns before your next appointment please send [place] a message through [place] or call our office at [phone].    TO LEAVE A MESSAGE FOR THE NURSE SELECT OPTION 2, PLEASE LEAVE A MESSAGE INCLUDING:  YOUR NAME  DATE OF BIRTH  CALL BACK NUMBER  REASON FOR CALL**this is important as we prioritize the call backs  YOU WILL RECEIVE A CALL BACK THE SAME DAY AS LONG AS YOU CALL BEFORE 4:00 PM  At the [facility] Clinic, you and your health needs are our priority. As part of our continuing mission to provide you with exceptional heart care, we have created designated Provider Care Teams. These Care Teams include your primary Cardiologist (physician) and Advanced Practice Providers (APPs- Physician Assistants and Nurse Practitioners) who all work together to provide you with the care you need, when you need it.   You may see any of the following providers on your designated Care Team at your next follow up:  Dr Arvilla Meres  Dr Carron Curie, NP  Robbie Lis, [place]  Karle Plumber, PharmD   Please be sure to bring in all your medications bottles to every appointment.

## 2020-10-16 NOTE — Progress Notes (Signed)
  Echocardiogram 2D Echocardiogram has been performed.  Pieter Partridge 10/16/2020, 8:36 AM

## 2020-10-16 NOTE — Progress Notes (Signed)
Zio patch placed onto patient.  All instructions and information reviewed with patient, they verbalize understanding with no questions.

## 2020-11-05 ENCOUNTER — Telehealth (HOSPITAL_COMMUNITY): Payer: Self-pay | Admitting: Cardiology

## 2020-11-05 DIAGNOSIS — I493 Ventricular premature depolarization: Secondary | ICD-10-CM

## 2020-11-05 MED ORDER — CARVEDILOL 12.5 MG PO TABS: 18.7500 mg | ORAL_TABLET | Freq: Two times a day (BID) | ORAL | 3 refills | Status: DC

## 2020-11-05 NOTE — Telephone Encounter (Signed)
Very frequent PVCs, 30% of total beats, though EF has improved back to 55% on recent echo. Would like to avoid committing him to amiodarone if possible. Would have him increase Coreg to 18.75 mg bid and refer for EP evaluation of PVCs.   Per Dr Shirlee Latch   Pt aware and voiced understanding Orders placed

## 2020-11-21 MED ORDER — CARVEDILOL 12.5 MG PO TABS
18.7500 mg | ORAL_TABLET | Freq: Two times a day (BID) | ORAL | 3 refills | Status: DC
Start: 2020-11-21 — End: 2021-03-12

## 2020-11-21 NOTE — Addendum Note (Signed)
Addended by: Theresia Bough on: 11/21/2020 10:18 AM   Modules accepted: Orders

## 2020-12-04 ENCOUNTER — Telehealth: Payer: Self-pay | Admitting: Internal Medicine

## 2020-12-04 NOTE — Telephone Encounter (Signed)
Follow Up:     Pt returning a call from today, but does not know who called him.

## 2021-03-12 ENCOUNTER — Other Ambulatory Visit: Payer: Self-pay

## 2021-03-12 ENCOUNTER — Ambulatory Visit (INDEPENDENT_AMBULATORY_CARE_PROVIDER_SITE_OTHER): Payer: Medicare (Managed Care) | Admitting: Internal Medicine

## 2021-03-12 ENCOUNTER — Encounter: Payer: Self-pay | Admitting: Internal Medicine

## 2021-03-12 VITALS — BP 98/68 | HR 71 | Ht 73.0 in | Wt 172.2 lb

## 2021-03-12 DIAGNOSIS — R55 Syncope and collapse: Secondary | ICD-10-CM

## 2021-03-12 DIAGNOSIS — I472 Ventricular tachycardia: Secondary | ICD-10-CM

## 2021-03-12 DIAGNOSIS — Z95810 Presence of automatic (implantable) cardiac defibrillator: Secondary | ICD-10-CM

## 2021-03-12 DIAGNOSIS — I428 Other cardiomyopathies: Secondary | ICD-10-CM

## 2021-03-12 DIAGNOSIS — I509 Heart failure, unspecified: Secondary | ICD-10-CM | POA: Diagnosis not present

## 2021-03-12 DIAGNOSIS — I493 Ventricular premature depolarization: Secondary | ICD-10-CM

## 2021-03-12 DIAGNOSIS — R9431 Abnormal electrocardiogram [ECG] [EKG]: Secondary | ICD-10-CM

## 2021-03-12 DIAGNOSIS — I4729 Other ventricular tachycardia: Secondary | ICD-10-CM

## 2021-03-12 NOTE — Progress Notes (Signed)
Patient Care Team: [facility] as PCP - General Laurey Morale, MD as PCP - Cardiology (Cardiology)   HPI  CC ventricular tachycardia and cardiomyopathy  Brandon Frye is a 75 y.o. male seen in follow-up for an ICD-Medtronic implanted for secondary 2/21 prevention for syncope with nonsustained ventricular tachycardia in the setting of nonischemic cardiomyopathy.  DATE TEST EF   2/21 Echo   20-25 %   2/21 LHC    % No obstruc CAD   12/21 Echo  55%     Date Cr K Hgb  12/21 1.46   5.0 17.7         Date PVCs  12/21 30%        The patient denies chest pain, shortness of breath, nocturnal dyspnea, orthopnea or peripheral edema.  There have been no palpitations, lightheadedness or syncope .    History of DVT on low-dose rivaroxaban  Records and Results Reviewed  Past Medical History:  Diagnosis Date  . AICD (automatic cardioverter/defibrillator) present   . Arthritis    hands, hip knees ankles,back  . Chronic systolic CHF (congestive heart failure) (HCC)   . DVT (deep venous thrombosis) (HCC)   . Non-ischemic cardiomyopathy (HCC) 2017   clean cath, EF 30%    Past Surgical History:  Procedure Laterality Date  . CARDIAC CATHETERIZATION  2017   at [facility] in [place], clean, EF 30%  . ICD IMPLANT N/A 12/26/2019   Procedure: ICD IMPLANT;  Surgeon: Duke Salvia, MD;  Location: [facility] INVASIVE CV LAB;  Service: Cardiovascular;  Laterality: N/A;  . INGUINAL HERNIA REPAIR Right 06/11/2020   Procedure: OPEN RIGHT INGUINAL HERNIA REPAIR WITH TAP BLOCK;  Surgeon: Berna Bue, MD;  Location: [facility] ORS;  Service: General;  Laterality: Right;  . INSERTION OF MESH Right 06/11/2020   Procedure: INSERTION OF MESH;  Surgeon: Berna Bue, MD;  Location: [facility] ORS;  Service: General;  Laterality: Right;  . KNEE SURGERY     Arthroscopic Left knee  . RIGHT/LEFT HEART CATH AND CORONARY ANGIOGRAPHY N/A 12/23/2019   Procedure: RIGHT/LEFT HEART CATH AND CORONARY  ANGIOGRAPHY;  Surgeon: Laurey Morale, MD;  Location: [facility] INVASIVE CV LAB;  Service: Cardiovascular;  Laterality: N/A;    Current Meds  Medication Sig  . albuterol (VENTOLIN HFA) 108 (90 Base) MCG/ACT inhaler Inhale 2 puffs into the lungs every 6 (six) hours as needed for wheezing or shortness of breath.   [name] aspirin EC 81 MG tablet Take 81 mg by mouth daily.  . brimonidine (ALPHAGAN P) 0.1 % SOLN INSTILL 1 DROP IN [facility] EYE TWICE A DAY  . Calcium Polycarbophil (FIBER) 625 MG TABS Take 1 tablet by mouth daily.  . carvedilol (COREG) 12.5 MG tablet Take 6.25 mg by mouth 2 (two) times daily with a meal.  . docusate sodium (COLACE) 50 MG capsule Take 50 mg by mouth daily as needed for mild constipation or moderate constipation.  . furosemide (LASIX) 20 MG tablet Take 20 mg by mouth.  . Ipratropium-Albuterol (COMBIVENT RESPIMAT) 20-100 MCG/ACT AERS respimat Inhale 1 puff into the lungs every 6 (six) hours as needed for wheezing or shortness of breath (or coughing).  . rivaroxaban (XARELTO) 10 MG TABS tablet Take 1 tablet (10 mg total) by mouth daily. Start 12/29/19  . sacubitril-valsartan (ENTRESTO) 49-51 MG Take 1 tablet by mouth 2 (two) times daily.  . sildenafil (VIAGRA) 100 MG tablet Take 100 mg by mouth as needed.  . [DISCONTINUED] carvedilol (  COREG) 12.5 MG tablet Take 1.5 tablets (18.75 mg total) by mouth 2 (two) times daily with a meal. (Patient taking differently: Take 6.25 mg by mouth 2 (two) times daily with a meal.)    Allergies  Allergen Reactions  . Lisinopril Cough      Review of Systems negative except from HPI and PMH  Physical Exam BP 98/68   Pulse 71   Ht 6\' 1"  (1.854 m)   Wt 172 lb 3.2 oz (78.1 kg)   SpO2 96%   BMI 22.72 kg/m  Well developed and well nourished in no acute distress HENT normal Neck supple with JVP-flat Clear Device pocket well healed; without hematoma or erythema.  There is no tethering  Irregular rate and rhythm, no  murmur Abd-soft with active  BS No Clubbing cyanosis   edema Skin-warm and dry A & Oriented  Grossly normal sensory and motor function  ECG sinus with frequent PVCs with a left bundle inferior axis morphology transition V 2-V4   CrCl cannot be calculated (Patient's most recent lab result is older than the maximum 21 days allowed.).   Assessment and  Plan  Nonischemic cardiomyopathy  Syncope  PVCs-RVOT-frequent  Ventricular tachycardia-nonsustained  ICD-Medtronic  The patient's device was interrogated.  The information was reviewed. No changes were made in the programming.      Polycythemia   No interval syncope  Euvolemic continue current meds-- with interval EF by now normalization we will continue his beta-blockers and Entresto.  I am somewhat surprised at the normalization of his LV function because of the heavy PVC burden.  In the absence of associated symptoms, the indication for proceeding with ablation would be changes in LV function.  Hence, we will repeat an echo and then would recommend annual or semiannual echoes to follow LV function and to proceed with catheter ablation in the event that recurrent LV dysfunction is appreciated  He has polycythemia.  However, I do not know the hemoglobin at this juncture would justify further evaluation.  We will continue to follow.

## 2021-03-12 NOTE — Patient Instructions (Signed)
Medication Instructions:  Your physician recommends that you continue on your current medications as directed. Please refer to the Current Medication list given to you today.  *If you need a refill on your cardiac medications before your next appointment, please call your pharmacy*   Lab Work: None ordered.  If you have labs (blood work) drawn today and your tests are completely normal, you will receive your results only by: [name] MyChart Message (if you have MyChart) OR . A paper copy in the mail If you have any lab test that is abnormal or we need to change your treatment, we will call you to review the results.   Testing/Procedures: Your physician has requested that you have an echocardiogram. Echocardiography is a painless test that uses sound waves to create images of your heart. It provides your doctor with information about the size and shape of your heart and how well your heart's chambers and valves are working. This procedure takes approximately one hour. There are no restrictions for this procedure.   Follow-Up: At [facility], you and your health needs are our priority.  As part of our continuing mission to provide you with exceptional heart care, we have created designated Provider Care Teams.  These Care Teams include your primary Cardiologist (physician) and Advanced Practice Providers (APPs -  Physician Assistants and Nurse Practitioners) who all work together to provide you with the care you need, when you need it.  We recommend signing up for the patient portal called "MyChart".  Sign up information is provided on this After Visit Summary.  MyChart is used to connect with patients for Virtual Visits (Telemedicine).  Patients are able to view lab/test results, encounter notes, upcoming appointments, etc.  Non-urgent messages can be sent to your provider as well.   To learn more about what you can do with MyChart, go to [email or web address].    Your next appointment:   12  month(s)  The format for your next appointment:   In Person  Provider:   Sherryl Manges, MD   Other Instructions Please schedule appointment with Dr Shirlee Latch

## 2021-04-10 ENCOUNTER — Other Ambulatory Visit: Payer: Self-pay

## 2021-04-10 ENCOUNTER — Ambulatory Visit (HOSPITAL_COMMUNITY): Payer: Medicare (Managed Care) | Attending: Cardiovascular Disease

## 2021-04-10 DIAGNOSIS — R9431 Abnormal electrocardiogram [ECG] [EKG]: Secondary | ICD-10-CM | POA: Diagnosis not present

## 2021-04-10 DIAGNOSIS — I493 Ventricular premature depolarization: Secondary | ICD-10-CM | POA: Diagnosis not present

## 2021-04-10 LAB — ECHOCARDIOGRAM COMPLETE
Area-P 1/2: 2.29 cm2
S' Lateral: 3.5 cm

## 2021-04-25 ENCOUNTER — Telehealth: Payer: Self-pay

## 2021-04-25 NOTE — Telephone Encounter (Signed)
Spoke with Brandon Frye and advised per Dr Graciela Husbands echo shows normal heart function and this is great news.  Will continue to follow Brandon Frye.  Brandon Frye verbalizes understanding and agrees with current plan.

## 2021-05-07 ENCOUNTER — Encounter (HOSPITAL_COMMUNITY): Payer: Medicare (Managed Care) | Admitting: Cardiology

## 2021-06-29 ENCOUNTER — Encounter (HOSPITAL_COMMUNITY): Payer: Self-pay

## 2021-06-29 ENCOUNTER — Emergency Department (HOSPITAL_COMMUNITY)
Admission: EM | Admit: 2021-06-29 | Discharge: 2021-06-29 | Disposition: A | Discharge status: Home / Self Care | Payer: No Typology Code available for payment source | Attending: Emergency Medicine | Admitting: Emergency Medicine

## 2021-06-29 ENCOUNTER — Other Ambulatory Visit: Payer: Self-pay

## 2021-06-29 DIAGNOSIS — Z7901 Long term (current) use of anticoagulants: Secondary | ICD-10-CM | POA: Insufficient documentation

## 2021-06-29 DIAGNOSIS — F17210 Nicotine dependence, cigarettes, uncomplicated: Secondary | ICD-10-CM | POA: Diagnosis not present

## 2021-06-29 DIAGNOSIS — J449 Chronic obstructive pulmonary disease, unspecified: Secondary | ICD-10-CM | POA: Insufficient documentation

## 2021-06-29 DIAGNOSIS — Z7951 Long term (current) use of inhaled steroids: Secondary | ICD-10-CM | POA: Diagnosis not present

## 2021-06-29 DIAGNOSIS — Z7982 Long term (current) use of aspirin: Secondary | ICD-10-CM | POA: Diagnosis not present

## 2021-06-29 DIAGNOSIS — K625 Hemorrhage of anus and rectum: Secondary | ICD-10-CM | POA: Diagnosis not present

## 2021-06-29 DIAGNOSIS — Z79899 Other long term (current) drug therapy: Secondary | ICD-10-CM | POA: Insufficient documentation

## 2021-06-29 DIAGNOSIS — I5043 Acute on chronic combined systolic (congestive) and diastolic (congestive) heart failure: Secondary | ICD-10-CM | POA: Insufficient documentation

## 2021-06-29 DIAGNOSIS — Z95810 Presence of automatic (implantable) cardiac defibrillator: Secondary | ICD-10-CM | POA: Diagnosis not present

## 2021-06-29 LAB — I-STAT CHEM 8, ED
BUN: 35 mg/dL — ABNORMAL HIGH (ref 8–23)
Calcium, Ion: 1.2 mmol/L (ref 1.15–1.40)
Chloride: 105 mmol/L (ref 98–111)
Creatinine, Ser: 2.2 mg/dL — ABNORMAL HIGH (ref 0.61–1.24)
Glucose, Bld: 99 mg/dL (ref 70–99)
HCT: 46 % (ref 39.0–52.0)
Hemoglobin: 15.6 g/dL (ref 13.0–17.0)
Potassium: 4.8 mmol/L (ref 3.5–5.1)
Sodium: 135 mmol/L (ref 135–145)
TCO2: 24 mmol/L (ref 22–32)

## 2021-06-29 LAB — CBC WITH DIFFERENTIAL/PLATELET
Abs Immature Granulocytes: 0.01 10*3/uL (ref 0.00–0.07)
Basophils Absolute: 0 10*3/uL (ref 0.0–0.1)
Basophils Relative: 1 %
Eosinophils Absolute: 0.1 10*3/uL (ref 0.0–0.5)
Eosinophils Relative: 3 %
HCT: 42.5 % (ref 39.0–52.0)
Hemoglobin: 14.4 g/dL (ref 13.0–17.0)
Immature Granulocytes: 0 %
Lymphocytes Relative: 39 %
Lymphs Abs: 1.7 10*3/uL (ref 0.7–4.0)
MCH: 29.6 pg (ref 26.0–34.0)
MCHC: 33.9 g/dL (ref 30.0–36.0)
MCV: 87.4 fL (ref 80.0–100.0)
Monocytes Absolute: 0.5 10*3/uL (ref 0.1–1.0)
Monocytes Relative: 11 %
Neutro Abs: 2 10*3/uL (ref 1.7–7.7)
Neutrophils Relative %: 46 %
Platelets: 199 10*3/uL (ref 150–400)
RBC: 4.86 MIL/uL (ref 4.22–5.81)
RDW: 13.5 % (ref 11.5–15.5)
WBC: 4.2 10*3/uL (ref 4.0–10.5)
nRBC: 0 % (ref 0.0–0.2)

## 2021-06-29 LAB — COMPREHENSIVE METABOLIC PANEL
ALT: 12 U/L (ref 0–44)
AST: 19 U/L (ref 15–41)
Albumin: 3.9 g/dL (ref 3.5–5.0)
Alkaline Phosphatase: 61 U/L (ref 38–126)
Anion gap: 7 (ref 5–15)
BUN: 33 mg/dL — ABNORMAL HIGH (ref 8–23)
CO2: 23 mmol/L (ref 22–32)
Calcium: 9 mg/dL (ref 8.9–10.3)
Chloride: 104 mmol/L (ref 98–111)
Creatinine, Ser: 2.06 mg/dL — ABNORMAL HIGH (ref 0.61–1.24)
GFR, Estimated: 34 mL/min — ABNORMAL LOW (ref >60)
Glucose, Bld: 107 mg/dL — ABNORMAL HIGH (ref 70–99)
Potassium: 4.8 mmol/L (ref 3.5–5.1)
Sodium: 134 mmol/L — ABNORMAL LOW (ref 135–145)
Total Bilirubin: 0.9 mg/dL (ref 0.3–1.2)
Total Protein: 7.2 g/dL (ref 6.5–8.1)

## 2021-06-29 LAB — PROTIME-INR
INR: 2 — ABNORMAL HIGH (ref 0.8–1.2)
Prothrombin Time: 22.3 seconds — ABNORMAL HIGH (ref 11.4–15.2)

## 2021-06-29 LAB — TYPE AND SCREEN
ABO/RH(D): O POS
Antibody Screen: NEGATIVE

## 2021-06-29 MED ORDER — SODIUM CHLORIDE 0.9 % IV SOLN: Freq: Once | INTRAVENOUS | Status: AC

## 2021-06-29 NOTE — ED Provider Notes (Signed)
[facility] EMERGENCY DEPARTMENT Provider Note   CSN: [id number] Arrival date & time: 06/29/21  1042     History No chief complaint on file.   Brandon Frye is a 75 y.o. male.  Patient is a 75 yo male presenting for rectal bleeding. Patient admits to rectal bleeding described as dark blood, scant, x 2 days. Denies nausea, vomiting, or abdominal pain. Denies light headedness, dizziness, or syncopal episodes. Denies previous hx of GI bleed. On Xarelto.   The history is provided by the patient. No language interpreter was used.  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/26/2020   Acute on chronic combined systolic and diastolic CHF (congestive heart failure) (HCC)    Cardiomyopathy (HCC) 12/22/2019   COPD (chronic obstructive pulmonary disease) (HCC) 12/22/2019   Hypertensive urgency 12/22/2019   Lung nodule 12/22/2019   Heart failure (HCC) 12/22/2019   Arthritis    Acute on chronic congestive heart failure (HCC)    Syncope    NSVT (nonsustained ventricular tachycardia) (HCC)     Past Surgical History:  Procedure Laterality Date   CARDIAC CATHETERIZATION  2017   at [facility] in [place], clean, EF 30%   ICD IMPLANT N/A 12/26/2019   Procedure: ICD IMPLANT;  Surgeon: Duke Salvia, MD;  Location: [facility] INVASIVE CV LAB;  Service: Cardiovascular;  Laterality: N/A;   INGUINAL HERNIA REPAIR Right 06/11/2020   Procedure: OPEN RIGHT  INGUINAL HERNIA REPAIR WITH TAP BLOCK;  Surgeon: Berna Bue, MD;  Location: [facility] ORS;  Service: General;  Laterality: Right;   INSERTION OF MESH Right 06/11/2020   Procedure: INSERTION OF MESH;  Surgeon: Berna Bue, MD;  Location: [facility] ORS;  Service: General;  Laterality: Right;   KNEE SURGERY     Arthroscopic Left knee   RIGHT/LEFT HEART CATH AND CORONARY ANGIOGRAPHY N/A 12/23/2019   Procedure: RIGHT/LEFT HEART CATH AND CORONARY ANGIOGRAPHY;  Surgeon: Laurey Morale, MD;  Location: [facility] INVASIVE CV LAB;  Service: Cardiovascular;  Laterality: N/A;       Family History  Problem Relation Age of Onset   Stroke Mother        Multiple strokes, died at 68   Heart attack Father 23    Social History   Tobacco Use   Smoking status: Every Day    Packs/day: 1.00    Years: 25.00    Pack years: 25.00    Types: Cigarettes   Smokeless tobacco: Never  Vaping Use   Vaping Use: Never used  Substance Use Topics   Alcohol use: Yes    Comment: occasional   Drug use: Yes    Types: Marijuana    Home Medications Prior to Admission medications   Medication Sig Start Date End Date Taking? Authorizing Provider  albuterol (VENTOLIN HFA) 108 (90 Base) MCG/ACT inhaler Inhale 2 puffs into the lungs every 6 (six) hours as needed for wheezing or shortness of breath.  [provider]  aspirin EC 81 MG tablet Take 81 mg by mouth daily.    [provider]  brimonidine (ALPHAGAN P) 0.1 % SOLN INSTILL 1 DROP IN [facility] EYE TWICE A DAY 02/07/21   [provider]  Calcium Polycarbophil (FIBER) 625 MG TABS Take 1 tablet by mouth daily. 11/22/20   [provider]  carvedilol (COREG) 12.5 MG tablet Take 6.25 mg by mouth 2 (two) times daily with a meal.    [provider]  docusate sodium (COLACE) 50 MG capsule Take 50 mg by mouth daily as needed for mild constipation or moderate constipation.    [provider]  furosemide (LASIX) 20 MG tablet Take 20 mg by  mouth.    [provider]  Ipratropium-Albuterol (COMBIVENT RESPIMAT) 20-100 MCG/ACT AERS respimat Inhale 1 puff into the lungs every 6 (six) hours as needed for wheezing or shortness of breath (or coughing). 04/10/17   [name], MD  rivaroxaban (XARELTO) 10 MG TABS tablet Take 1 tablet (10 mg total) by mouth daily. Start 12/29/19 03/19/20   Laurey Morale, MD  sacubitril-valsartan (ENTRESTO) 49-51 MG Take 1 tablet by mouth 2 (two) times daily. 03/19/20   Laurey Morale, MD  sildenafil (VIAGRA) 100 MG tablet Take 100 mg by mouth as needed. 02/19/21   [provider]    Allergies    Lisinopril  Review of Systems   Review of Systems  Constitutional:  Negative for chills and fever.  HENT:  Negative for ear pain and sore throat.   Eyes:  Negative for pain and visual disturbance.  Respiratory:  Negative for cough and shortness of breath.   Cardiovascular:  Negative for chest pain and palpitations.  Gastrointestinal:  Positive for blood in stool and hematochezia. Negative for abdominal pain and vomiting.  Genitourinary:  Negative for dysuria and hematuria.  Musculoskeletal:  Negative for arthralgias and back pain.  Skin:  Negative for color change and rash.  Neurological:  Negative for seizures and syncope.  All other systems reviewed and are negative.  Physical Exam Updated Vital Signs BP (!) 88/56   Pulse (!) 34   Temp 98.7 F (37.1 C) (Oral)   Resp (!) 26   SpO2 93%   Physical Exam Vitals and nursing note reviewed.  Constitutional:      Appearance: He is well-developed.  HENT:     Head: Normocephalic and atraumatic.  Eyes:     Conjunctiva/sclera: Conjunctivae normal.  Cardiovascular:     Rate and Rhythm: Normal rate and regular rhythm.     Heart sounds: No murmur heard. Pulmonary:     Effort: Pulmonary effort is normal. No respiratory distress.     Breath sounds: Normal breath sounds.  Abdominal:     Palpations: Abdomen is soft.     Tenderness: There  is no abdominal tenderness.  Musculoskeletal:     Cervical back: Neck supple.  Skin:    General: Skin is warm and dry.  Neurological:     Mental Status: He is alert.    ED Results / Procedures / Treatments   Labs (all labs ordered are listed, but only abnormal results are displayed) Labs Reviewed  I-STAT CHEM 8, ED - Abnormal; Notable for the following components:      Result Value   BUN 35 (*)    Creatinine, Ser 2.20 (*)    All other components within normal limits  PROTIME-INR  CBC WITH DIFFERENTIAL/PLATELET  COMPREHENSIVE METABOLIC PANEL  TYPE AND SCREEN  EKG None  Radiology No results found.  Procedures Procedures   Medications Ordered in ED Medications  0.9 %  sodium chloride infusion (has no administration in time range)    ED Course  I have reviewed the triage vital signs and the nursing notes.  Pertinent labs & imaging results that were available during my care of the patient were reviewed by me and considered in my medical decision making (see chart for details).    MDM Rules/Calculators/A&P                           11:43 AM 75 yo male presenting for rectal bleeding. Patient is Aox3, no acute distress, afebrile, with stable vitals.   Hemoglobin stable.  Rectal exam offered and declined. CT offered and declined.  Patient in no distress and overall condition improved here in the ED. Detailed discussions were had with the patient regarding current findings, and need for close f/u with GI specialist for rectal bleeding. The patient has been instructed to return immediately if the symptoms worsen in any way for re-evaluation. Patient verbalized understanding and is in agreement with current care plan. All questions answered prior to discharge.           Final Clinical Impression(s) / ED Diagnoses Final diagnoses:  Rectal bleeding    Rx / DC Orders ED Discharge Orders     None        Franne Forts, DO 07/01/21 1309

## 2021-06-29 NOTE — ED Provider Notes (Signed)
Emergency Medicine Provider Triage Evaluation Note  Brandon Frye , a 75 y.o. male  was evaluated in triage.  Pt complains of bright red blood PR 2 days.  Denies any lightheadedness dizziness chest pain shortness of breath abdominal pain diarrhea denies any dark or tarry stool.   Patient does take Xarelto  Review of Systems  Positive: BRB PR Negative: LH, sob  Physical Exam  There were no vitals taken for this visit. Gen:   Awake, no distress   Resp:  Normal effort  MSK:   Moves extremities without difficulty  Other:  Abdomen soft, nontender.  No guarding or rebound.  Patient is able answer questions appropriately follow commands.  Medical Decision Making  Medically screening exam initiated at 10:48 AM.  Appropriate orders placed.  Brandon Frye was informed that the remainder of the evaluation will be completed by another provider, this initial triage assessment does not replace that evaluation, and the importance of remaining in the ED until their evaluation is complete.  Well-appearing 75 year old male however is anticoagulated with DOAC and BRBPR with low blood pressure.  We will expediently move patient to major care.  Type and screen INR i-STAT 8 and basic labs obtained.   Solon Augusta [place], [place] 06/29/21 1059    Lorre Nick, MD 07/02/21 [phone]

## 2021-06-29 NOTE — ED Notes (Signed)
EDP notified of low BP 

## 2021-06-29 NOTE — ED Triage Notes (Signed)
Patient complains of being constipated earlier in week and used laxatives and now experiencing painless rectal bleeding. Patient alert and oriented, NAD

## 2021-06-29 NOTE — ED Notes (Signed)
DC instructions reviewed with pt. PT verbalized understanding. PT DC °

## 2022-03-17 ENCOUNTER — Encounter: Payer: Medicare PPO | Admitting: Internal Medicine

## 2022-04-07 DIAGNOSIS — I493 Ventricular premature depolarization: Secondary | ICD-10-CM | POA: Insufficient documentation

## 2022-04-14 ENCOUNTER — Encounter: Payer: Medicare PPO | Admitting: Internal Medicine

## 2022-04-14 DIAGNOSIS — I4729 Other ventricular tachycardia: Secondary | ICD-10-CM

## 2022-04-14 DIAGNOSIS — I428 Other cardiomyopathies: Secondary | ICD-10-CM

## 2022-04-14 DIAGNOSIS — I493 Ventricular premature depolarization: Secondary | ICD-10-CM

## 2022-04-14 DIAGNOSIS — Z95810 Presence of automatic (implantable) cardiac defibrillator: Secondary | ICD-10-CM

## 2022-06-11 ENCOUNTER — Emergency Department (HOSPITAL_COMMUNITY)
Admission: EM | Admit: 2022-06-11 | Discharge: 2022-06-11 | Discharge status: Against Medical Advice | Payer: No Typology Code available for payment source | Attending: Physician Assistant | Admitting: Physician Assistant

## 2022-06-11 ENCOUNTER — Other Ambulatory Visit: Payer: Self-pay

## 2022-06-11 ENCOUNTER — Encounter (HOSPITAL_COMMUNITY): Payer: Self-pay | Admitting: Emergency Medicine

## 2022-06-11 DIAGNOSIS — Z5321 Procedure and treatment not carried out due to patient leaving prior to being seen by health care provider: Secondary | ICD-10-CM | POA: Diagnosis not present

## 2022-06-11 DIAGNOSIS — R197 Diarrhea, unspecified: Secondary | ICD-10-CM | POA: Diagnosis present

## 2022-06-11 DIAGNOSIS — R109 Unspecified abdominal pain: Secondary | ICD-10-CM | POA: Insufficient documentation

## 2022-06-11 LAB — COMPREHENSIVE METABOLIC PANEL
ALT: 11 U/L (ref 0–44)
AST: 21 U/L (ref 15–41)
Albumin: 4.1 g/dL (ref 3.5–5.0)
Alkaline Phosphatase: 67 U/L (ref 38–126)
Anion gap: 10 (ref 5–15)
BUN: 33 mg/dL — ABNORMAL HIGH (ref 8–23)
CO2: 24 mmol/L (ref 22–32)
Calcium: 9.4 mg/dL (ref 8.9–10.3)
Chloride: 102 mmol/L (ref 98–111)
Creatinine, Ser: 1.93 mg/dL — ABNORMAL HIGH (ref 0.61–1.24)
GFR, Estimated: 36 mL/min — ABNORMAL LOW (ref >60)
Glucose, Bld: 80 mg/dL (ref 70–99)
Potassium: 4.9 mmol/L (ref 3.5–5.1)
Sodium: 136 mmol/L (ref 135–145)
Total Bilirubin: 0.7 mg/dL (ref 0.3–1.2)
Total Protein: 7.9 g/dL (ref 6.5–8.1)

## 2022-06-11 LAB — LIPASE, BLOOD: Lipase: 32 U/L (ref 11–51)

## 2022-06-11 LAB — CBC WITH DIFFERENTIAL/PLATELET
Abs Immature Granulocytes: 0.02 10*3/uL (ref 0.00–0.07)
Basophils Absolute: 0 10*3/uL (ref 0.0–0.1)
Basophils Relative: 1 %
Eosinophils Absolute: 0.2 10*3/uL (ref 0.0–0.5)
Eosinophils Relative: 2 %
HCT: 42.2 % (ref 39.0–52.0)
Hemoglobin: 14.2 g/dL (ref 13.0–17.0)
Immature Granulocytes: 0 %
Lymphocytes Relative: 25 %
Lymphs Abs: 1.6 10*3/uL (ref 0.7–4.0)
MCH: 29.5 pg (ref 26.0–34.0)
MCHC: 33.6 g/dL (ref 30.0–36.0)
MCV: 87.7 fL (ref 80.0–100.0)
Monocytes Absolute: 0.8 10*3/uL (ref 0.1–1.0)
Monocytes Relative: 13 %
Neutro Abs: 3.7 10*3/uL (ref 1.7–7.7)
Neutrophils Relative %: 59 %
Platelets: 192 10*3/uL (ref 150–400)
RBC: 4.81 MIL/uL (ref 4.22–5.81)
RDW: 13.7 % (ref 11.5–15.5)
WBC: 6.3 10*3/uL (ref 4.0–10.5)
nRBC: 0 % (ref 0.0–0.2)

## 2022-06-11 LAB — URINALYSIS, ROUTINE W REFLEX MICROSCOPIC
Bilirubin Urine: NEGATIVE
Glucose, UA: NEGATIVE mg/dL
Hgb urine dipstick: NEGATIVE
Ketones, ur: NEGATIVE mg/dL
Leukocytes,Ua: NEGATIVE
Nitrite: NEGATIVE
Protein, ur: NEGATIVE mg/dL
Specific Gravity, Urine: 1.034 — ABNORMAL HIGH (ref 1.005–1.030)
pH: 5 (ref 5.0–8.0)

## 2022-06-11 LAB — LACTIC ACID, PLASMA: Lactic Acid, Venous: 1.2 mmol/L (ref 0.5–1.9)

## 2022-06-11 NOTE — ED Triage Notes (Signed)
Pt with abdominal pain x 4 days.  Hx of colitis.

## 2022-06-11 NOTE — ED Notes (Signed)
Pt has been aggressive with staff. Pt walked out the department and never came back

## 2022-06-11 NOTE — ED Provider Triage Note (Signed)
Emergency Medicine Provider Triage Evaluation Note  Brandon Frye , a 76 y.o. male  was evaluated in triage.  Pt complains of diarrhea and abdominal pain for the past 4 days.  Patient had a CT outpatient which showed colitis versus appendicitis.  Patient reports he is not getting any better.  Had an episode of diarrhea this afternoon without any blood in his stool.  Review of Systems  Positive: Diarrhea, abdominal pain Negative: Fever, nausea, vomiting.  Physical Exam  BP 123/79   Pulse 71   Temp 98.2 F (36.8 C) (Oral)   Resp (!) 22   SpO2 95%  Gen:   Awake, no distress   Resp:  Normal effort  MSK:   Moves extremities without difficulty  Other:  Back brace in place, abdomen soft mildly tender along the right lower quadrant.  Medical Decision Making  Medically screening exam initiated at 4:14 PM.  Appropriate orders placed.  [name] was informed that the remainder of the evaluation will be completed by another provider, this initial triage assessment does not replace that evaluation, and the importance of remaining in the ED until their evaluation is complete.     Claude Manges, PA-C 06/11/22 [phone]

## 2022-06-12 ENCOUNTER — Inpatient Hospital Stay (HOSPITAL_COMMUNITY)
Admission: EM | Admit: 2022-06-12 | Discharge: 2022-06-15 | DRG: 683 | Disposition: A | Discharge status: Home / Self Care | Payer: No Typology Code available for payment source | Attending: Internal Medicine | Admitting: Internal Medicine

## 2022-06-12 ENCOUNTER — Emergency Department (HOSPITAL_COMMUNITY)
Admission: EM | Admit: 2022-06-12 | Discharge: 2022-06-12 | Discharge status: Against Medical Advice | Payer: No Typology Code available for payment source | Attending: Student | Admitting: Student

## 2022-06-12 ENCOUNTER — Encounter (HOSPITAL_COMMUNITY): Payer: Self-pay

## 2022-06-12 ENCOUNTER — Other Ambulatory Visit: Payer: Self-pay

## 2022-06-12 ENCOUNTER — Emergency Department (HOSPITAL_COMMUNITY): Payer: No Typology Code available for payment source

## 2022-06-12 DIAGNOSIS — K529 Noninfective gastroenteritis and colitis, unspecified: Secondary | ICD-10-CM | POA: Diagnosis present

## 2022-06-12 DIAGNOSIS — R1012 Left upper quadrant pain: Secondary | ICD-10-CM | POA: Insufficient documentation

## 2022-06-12 DIAGNOSIS — I509 Heart failure, unspecified: Secondary | ICD-10-CM

## 2022-06-12 DIAGNOSIS — Z79899 Other long term (current) drug therapy: Secondary | ICD-10-CM | POA: Diagnosis not present

## 2022-06-12 DIAGNOSIS — J449 Chronic obstructive pulmonary disease, unspecified: Secondary | ICD-10-CM | POA: Diagnosis present

## 2022-06-12 DIAGNOSIS — I130 Hypertensive heart and chronic kidney disease with heart failure and stage 1 through stage 4 chronic kidney disease, or unspecified chronic kidney disease: Secondary | ICD-10-CM | POA: Diagnosis present

## 2022-06-12 DIAGNOSIS — E860 Dehydration: Secondary | ICD-10-CM | POA: Diagnosis present

## 2022-06-12 DIAGNOSIS — N179 Acute kidney failure, unspecified: Secondary | ICD-10-CM | POA: Diagnosis present

## 2022-06-12 DIAGNOSIS — R630 Anorexia: Secondary | ICD-10-CM | POA: Diagnosis not present

## 2022-06-12 DIAGNOSIS — I5032 Chronic diastolic (congestive) heart failure: Secondary | ICD-10-CM

## 2022-06-12 DIAGNOSIS — Z5321 Procedure and treatment not carried out due to patient leaving prior to being seen by health care provider: Secondary | ICD-10-CM | POA: Insufficient documentation

## 2022-06-12 DIAGNOSIS — I428 Other cardiomyopathies: Secondary | ICD-10-CM | POA: Diagnosis present

## 2022-06-12 DIAGNOSIS — Z8249 Family history of ischemic heart disease and other diseases of the circulatory system: Secondary | ICD-10-CM | POA: Diagnosis not present

## 2022-06-12 DIAGNOSIS — R1031 Right lower quadrant pain: Secondary | ICD-10-CM | POA: Diagnosis present

## 2022-06-12 DIAGNOSIS — Z7982 Long term (current) use of aspirin: Secondary | ICD-10-CM | POA: Diagnosis not present

## 2022-06-12 DIAGNOSIS — Z86718 Personal history of other venous thrombosis and embolism: Secondary | ICD-10-CM

## 2022-06-12 DIAGNOSIS — Z823 Family history of stroke: Secondary | ICD-10-CM

## 2022-06-12 DIAGNOSIS — Z95810 Presence of automatic (implantable) cardiac defibrillator: Secondary | ICD-10-CM | POA: Diagnosis not present

## 2022-06-12 DIAGNOSIS — I5042 Chronic combined systolic (congestive) and diastolic (congestive) heart failure: Secondary | ICD-10-CM | POA: Diagnosis present

## 2022-06-12 DIAGNOSIS — E875 Hyperkalemia: Secondary | ICD-10-CM | POA: Diagnosis present

## 2022-06-12 DIAGNOSIS — F17210 Nicotine dependence, cigarettes, uncomplicated: Secondary | ICD-10-CM | POA: Diagnosis present

## 2022-06-12 DIAGNOSIS — Z7901 Long term (current) use of anticoagulants: Secondary | ICD-10-CM | POA: Diagnosis not present

## 2022-06-12 DIAGNOSIS — N1831 Chronic kidney disease, stage 3a: Secondary | ICD-10-CM | POA: Diagnosis present

## 2022-06-12 DIAGNOSIS — R197 Diarrhea, unspecified: Secondary | ICD-10-CM | POA: Diagnosis not present

## 2022-06-12 DIAGNOSIS — S91109D Unspecified open wound of unspecified toe(s) without damage to nail, subsequent encounter: Secondary | ICD-10-CM

## 2022-06-12 LAB — COMPREHENSIVE METABOLIC PANEL
ALT: 11 U/L (ref 0–44)
ALT: 10 U/L (ref 0–44)
AST: 18 U/L (ref 15–41)
AST: 22 U/L (ref 15–41)
Albumin: 3.8 g/dL (ref 3.5–5.0)
Albumin: 4.5 g/dL (ref 3.5–5.0)
Alkaline Phosphatase: 71 U/L (ref 38–126)
Alkaline Phosphatase: 66 U/L (ref 38–126)
Anion gap: 9 (ref 5–15)
Anion gap: 8 (ref 5–15)
BUN: 40 mg/dL — ABNORMAL HIGH (ref 8–23)
BUN: 46 mg/dL — ABNORMAL HIGH (ref 8–23)
CO2: 23 mmol/L (ref 22–32)
CO2: 25 mmol/L (ref 22–32)
Calcium: 9.2 mg/dL (ref 8.9–10.3)
Calcium: 9.6 mg/dL (ref 8.9–10.3)
Chloride: 103 mmol/L (ref 98–111)
Chloride: 104 mmol/L (ref 98–111)
Creatinine, Ser: 2.2 mg/dL — ABNORMAL HIGH (ref 0.61–1.24)
Creatinine, Ser: 2.32 mg/dL — ABNORMAL HIGH (ref 0.61–1.24)
GFR, Estimated: 31 mL/min — ABNORMAL LOW (ref >60)
GFR, Estimated: 29 mL/min — ABNORMAL LOW (ref >60)
Glucose, Bld: 117 mg/dL — ABNORMAL HIGH (ref 70–99)
Glucose, Bld: 82 mg/dL (ref 70–99)
Potassium: 4.4 mmol/L (ref 3.5–5.1)
Potassium: 5.2 mmol/L — ABNORMAL HIGH (ref 3.5–5.1)
Sodium: 135 mmol/L (ref 135–145)
Sodium: 137 mmol/L (ref 135–145)
Total Bilirubin: 0.3 mg/dL (ref 0.3–1.2)
Total Bilirubin: 0.8 mg/dL (ref 0.3–1.2)
Total Protein: 7.3 g/dL (ref 6.5–8.1)
Total Protein: 8.5 g/dL — ABNORMAL HIGH (ref 6.5–8.1)

## 2022-06-12 LAB — CBC WITH DIFFERENTIAL/PLATELET
Abs Immature Granulocytes: 0.01 10*3/uL (ref 0.00–0.07)
Abs Immature Granulocytes: 0.03 10*3/uL (ref 0.00–0.07)
Basophils Absolute: 0 10*3/uL (ref 0.0–0.1)
Basophils Absolute: 0 10*3/uL (ref 0.0–0.1)
Basophils Relative: 1 %
Basophils Relative: 1 %
Eosinophils Absolute: 0.2 10*3/uL (ref 0.0–0.5)
Eosinophils Absolute: 0.2 10*3/uL (ref 0.0–0.5)
Eosinophils Relative: 3 %
Eosinophils Relative: 3 %
HCT: 38.8 % — ABNORMAL LOW (ref 39.0–52.0)
HCT: 43.5 % (ref 39.0–52.0)
Hemoglobin: 13.3 g/dL (ref 13.0–17.0)
Hemoglobin: 14.3 g/dL (ref 13.0–17.0)
Immature Granulocytes: 0 %
Immature Granulocytes: 1 %
Lymphocytes Relative: 22 %
Lymphocytes Relative: 27 %
Lymphs Abs: 1.2 10*3/uL (ref 0.7–4.0)
Lymphs Abs: 1.7 10*3/uL (ref 0.7–4.0)
MCH: 29.7 pg (ref 26.0–34.0)
MCH: 29.5 pg (ref 26.0–34.0)
MCHC: 34.3 g/dL (ref 30.0–36.0)
MCHC: 32.9 g/dL (ref 30.0–36.0)
MCV: 86.6 fL (ref 80.0–100.0)
MCV: 89.9 fL (ref 80.0–100.0)
Monocytes Absolute: 0.9 10*3/uL (ref 0.1–1.0)
Monocytes Absolute: 0.9 10*3/uL (ref 0.1–1.0)
Monocytes Relative: 16 %
Monocytes Relative: 15 %
Neutro Abs: 3.2 10*3/uL (ref 1.7–7.7)
Neutro Abs: 3.4 10*3/uL (ref 1.7–7.7)
Neutrophils Relative %: 58 %
Neutrophils Relative %: 53 %
Platelets: 179 10*3/uL (ref 150–400)
Platelets: 166 10*3/uL (ref 150–400)
RBC: 4.48 MIL/uL (ref 4.22–5.81)
RBC: 4.84 MIL/uL (ref 4.22–5.81)
RDW: 13.7 % (ref 11.5–15.5)
RDW: 14.1 % (ref 11.5–15.5)
WBC: 5.4 10*3/uL (ref 4.0–10.5)
WBC: 6.3 10*3/uL (ref 4.0–10.5)
nRBC: 0 % (ref 0.0–0.2)
nRBC: 0 % (ref 0.0–0.2)

## 2022-06-12 LAB — LACTIC ACID, PLASMA
Lactic Acid, Venous: 0.7 mmol/L (ref 0.5–1.9)
Lactic Acid, Venous: 0.8 mmol/L (ref 0.5–1.9)

## 2022-06-12 LAB — URINALYSIS, ROUTINE W REFLEX MICROSCOPIC
Bilirubin Urine: NEGATIVE
Glucose, UA: NEGATIVE mg/dL
Hgb urine dipstick: NEGATIVE
Ketones, ur: NEGATIVE mg/dL
Leukocytes,Ua: NEGATIVE
Nitrite: NEGATIVE
Protein, ur: NEGATIVE mg/dL
Specific Gravity, Urine: 1.028 (ref 1.005–1.030)
pH: 5 (ref 5.0–8.0)

## 2022-06-12 LAB — LIPASE, BLOOD: Lipase: 37 U/L (ref 11–51)

## 2022-06-12 MED ORDER — OXYCODONE-ACETAMINOPHEN 5-325 MG PO TABS
1.0000 | ORAL_TABLET | Freq: Once | ORAL | Status: DC
  Filled 2022-06-12: qty 1

## 2022-06-12 MED ORDER — LORATADINE 10 MG PO TABS
10.0000 mg | ORAL_TABLET | Freq: Every day | ORAL | Status: DC
  Administered 2022-06-13 – 2022-06-15 (×3): 10 mg via ORAL
  Filled 2022-06-12 (×3): qty 1

## 2022-06-12 MED ORDER — CARVEDILOL 12.5 MG PO TABS
12.5000 mg | ORAL_TABLET | Freq: Two times a day (BID) | ORAL | Status: DC
  Administered 2022-06-13 – 2022-06-15 (×5): 12.5 mg via ORAL
  Filled 2022-06-12 (×5): qty 1

## 2022-06-12 MED ORDER — HYDROCODONE-ACETAMINOPHEN 5-325 MG PO TABS
1.0000 | ORAL_TABLET | Freq: Four times a day (QID) | ORAL | Status: DC | PRN
  Administered 2022-06-13 – 2022-06-14 (×4): 1 via ORAL
  Filled 2022-06-12 (×4): qty 1

## 2022-06-12 MED ORDER — BRIMONIDINE TARTRATE 0.15 % OP SOLN
1.0000 [drp] | Freq: Two times a day (BID) | OPHTHALMIC | Status: DC
  Administered 2022-06-13 – 2022-06-15 (×4): 1 [drp] via OPHTHALMIC
  Filled 2022-06-12 (×2): qty 5

## 2022-06-12 MED ORDER — RIVAROXABAN 10 MG PO TABS
10.0000 mg | ORAL_TABLET | Freq: Every day | ORAL | Status: DC
  Administered 2022-06-13 – 2022-06-15 (×3): 10 mg via ORAL
  Filled 2022-06-12 (×3): qty 1

## 2022-06-12 MED ORDER — GABAPENTIN 300 MG PO CAPS
300.0000 mg | ORAL_CAPSULE | Freq: Every day | ORAL | Status: DC
  Administered 2022-06-13 – 2022-06-14 (×3): 300 mg via ORAL
  Filled 2022-06-12 (×3): qty 1

## 2022-06-12 MED ORDER — HEPARIN SODIUM (PORCINE) 5000 UNIT/ML IJ SOLN: 5000.0000 [IU] | Freq: Three times a day (TID) | INTRAMUSCULAR | Status: DC

## 2022-06-12 MED ORDER — LACTATED RINGERS IV SOLN: INTRAVENOUS | Status: AC

## 2022-06-12 MED ORDER — FLUTICASONE PROPIONATE 50 MCG/ACT NA SUSP
1.0000 | Freq: Every day | NASAL | Status: DC
  Administered 2022-06-14 – 2022-06-15 (×2): 1 via NASAL
  Filled 2022-06-12: qty 16

## 2022-06-12 MED ORDER — METRONIDAZOLE 500 MG/100ML IV SOLN
500.0000 mg | Freq: Two times a day (BID) | INTRAVENOUS | Status: DC
  Administered 2022-06-12 – 2022-06-15 (×6): 500 mg via INTRAVENOUS
  Filled 2022-06-12 (×7): qty 100

## 2022-06-12 MED ORDER — SODIUM CHLORIDE 0.9 % IV SOLN: INTRAVENOUS | Status: DC

## 2022-06-12 MED ORDER — SODIUM CHLORIDE 0.9 % IV SOLN: INTRAVENOUS | Status: DC | PRN

## 2022-06-12 MED ORDER — SODIUM ZIRCONIUM CYCLOSILICATE 5 G PO PACK
5.0000 g | PACK | Freq: Once | ORAL | Status: AC
  Administered 2022-06-13: 5 g via ORAL
  Filled 2022-06-12: qty 1

## 2022-06-12 MED ORDER — SODIUM CHLORIDE 0.9 % IV SOLN
2.0000 g | INTRAVENOUS | Status: DC
  Administered 2022-06-12 – 2022-06-14 (×3): 2 g via INTRAVENOUS
  Filled 2022-06-12 (×3): qty 20

## 2022-06-12 MED ORDER — SODIUM CHLORIDE 0.9 % IV BOLUS
1000.0000 mL | Freq: Once | INTRAVENOUS | Status: AC
  Administered 2022-06-12: 1000 mL via INTRAVENOUS

## 2022-06-12 MED ORDER — ATORVASTATIN CALCIUM 40 MG PO TABS
40.0000 mg | ORAL_TABLET | Freq: Every day | ORAL | Status: DC
  Administered 2022-06-13 – 2022-06-15 (×3): 40 mg via ORAL
  Filled 2022-06-12 (×3): qty 1

## 2022-06-12 MED ORDER — OXYCODONE-ACETAMINOPHEN 5-325 MG PO TABS
1.0000 | ORAL_TABLET | Freq: Once | ORAL | Status: AC
  Administered 2022-06-12: 1 via ORAL
  Filled 2022-06-12: qty 1

## 2022-06-12 MED ORDER — ASPIRIN 81 MG PO TBEC
81.0000 mg | DELAYED_RELEASE_TABLET | Freq: Every day | ORAL | Status: DC
  Administered 2022-06-13 – 2022-06-15 (×3): 81 mg via ORAL
  Filled 2022-06-12 (×3): qty 1

## 2022-06-12 MED ORDER — FLUTICASONE FUROATE 27.5 MCG/SPRAY NA SUSP: 1.0000 | Freq: Every day | NASAL | Status: DC

## 2022-06-12 MED ORDER — FENTANYL CITRATE PF 50 MCG/ML IJ SOSY
50.0000 ug | PREFILLED_SYRINGE | Freq: Once | INTRAMUSCULAR | Status: AC
  Administered 2022-06-12: 50 ug via INTRAVENOUS
  Filled 2022-06-12: qty 1

## 2022-06-12 NOTE — ED Provider Triage Note (Signed)
Emergency Medicine Provider Triage Evaluation Note  Tzion Wedel , a 76 y.o. male  was evaluated in triage.  Pt complains of ongoing abdominal pain for the past 5 days.  Patient reports the pain started to the central/left-sided abdomen but has since seem to localize to the right side of the abdomen more so in the RLQ.  It is constant, severe, no alleviating or aggravating factors.  Has had decreased appetite and has had an episode of diarrhea associated with this. [facility], wait was too long therefore he left and then returned. Denies fever, chills, vomiting, melena, or hematochezia.   Review of Systems  Per above  Physical Exam  BP 100/64 (BP Location: Right Arm)   Pulse 80   Temp 98.3 F (36.8 C) (Oral)   Resp 19   Ht 6\' 1"  (1.854 m)   Wt 79.4 kg   SpO2 95%   BMI 23.09 kg/m  Gen:   Awake, no distress   Resp:  Normal effort  MSK:   Moves extremities without difficulty  Other:  RLQ abdominal TTP.   Medical Decision Making  Medically screening exam initiated at 5:14 AM.  Appropriate orders placed.  Giomar Gusler was informed that the remainder of the evaluation will be completed by another provider, this initial triage assessment does not replace that evaluation, and the importance of remaining in the ED until their evaluation is complete.  Abdominal pain.   CT report from yesterday AM below.     Poor renal function that has been variable at prior visits- holding off on ordering CT imaging from triage with labs pending @ this time.    [name], Cherly Anderson 06/13/22 0050

## 2022-06-12 NOTE — ED Notes (Signed)
PATIENT LEFT WITHOUT BEING SEEN

## 2022-06-12 NOTE — Assessment & Plan Note (Signed)
Due to cardiomyopathy and hx of NSVT

## 2022-06-12 NOTE — Assessment & Plan Note (Signed)
K of 5.3 -Give Lokelma x1 

## 2022-06-12 NOTE — ED Triage Notes (Signed)
Pt sent here from [place] with abnormal CT and LUQ abd pain. Pt also has diarrhea. Denies NV. Has been going for 3 days.

## 2022-06-12 NOTE — Assessment & Plan Note (Addendum)
-  start on IV Rocephin and Flagyl. -full liquid diet for bowel rest -check GI stool panel and C.diff testing -There is a short-segment of the mid sigmoid colon does appear to show circumferential wall thickening. Follow-up colonoscopy recommended to exclude underlying lesion. Reportedly had colonoscopy in 2020 at [facility] that was "negative."  -There was also concerns of possible early appendicitis on imaging but does not appear to correlate with exam. General surgery Dr. Gerrit Friends is aware and will see in consultation tomorrow

## 2022-06-12 NOTE — ED Notes (Addendum)
Sent Lt [facility] Si

## 2022-06-12 NOTE — Assessment & Plan Note (Addendum)
Creatinine with AKI versus worsening from 1.93 yesterday to 2.32 on presentation.  -Likely pre-renal from GI symptoms. -Hold home Lasix and Entresto -keep on IV LR fluid continuous overnight -CT imaging had concerns of contrast nephrotoxicity by nephrology Dr. Allena Katz is not concerned.

## 2022-06-12 NOTE — ED Triage Notes (Signed)
Patient states he went to the [place] yesterday and was told to come to the ED due to an abnormal CT scan of the abdomen. Patient c/o LUQ pain that radiates to the RUQ. Patient also c/o diarrhea x 4 days.  Patient denies any N/V.

## 2022-06-12 NOTE — Assessment & Plan Note (Signed)
Hx of recurrent DVT. Continue Xarelto for now but will need to monitor Cr Clearance closely with AKI

## 2022-06-12 NOTE — Assessment & Plan Note (Signed)
stable °

## 2022-06-12 NOTE — ED Notes (Signed)
Lab said they need a recollect on the light green, informed April, P-EMT.

## 2022-06-12 NOTE — Assessment & Plan Note (Addendum)
Last echo with EF of 60-65% and grade 1 diastolic dysfunction on 04/2021. Stable.

## 2022-06-12 NOTE — ED Provider Notes (Signed)
[facility]-EMERGENCY DEPT Provider Note   CSN: [id number] Arrival date & time: 06/12/22  1348     History  Chief Complaint  Patient presents with   Abdominal Pain    Brandon Frye is a 76 y.o. male.  HPI Patient presenting for evaluation of abdominal pain.  He has been diagnosed with colitis, based on his CT scan at the [facility] so decided to come here to get that taken care of.  He states he is not been able to eat for 3 days due to upper abdominal pain.  He states he is having diarrhea which is nonbloody.  He has not had any vomiting.  He denies fever.  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  He presented to [facility] emergency department twice within the last 24 hours, each time had labs done and left before evaluation.  Labs done were nondiagnostic    Home Medications Prior to Admission medications   Medication Sig Start Date End Date Taking? Authorizing Provider  albuterol (VENTOLIN HFA) 108 (90 Base) MCG/ACT inhaler Inhale 2 puffs into the lungs every 6 (six) hours as needed for wheezing or shortness of breath.     [provider]  aspirin EC 81 MG tablet Take 81 mg by mouth daily.    [provider]  brimonidine (ALPHAGAN P) 0.1 % SOLN INSTILL 1 DROP IN [facility] EYE TWICE A DAY 02/07/21   [provider]  Calcium Polycarbophil (FIBER) 625 MG TABS Take 1 tablet by mouth daily. 11/22/20   [provider]  carvedilol (COREG) 12.5 MG tablet Take 6.25 mg by mouth 2 (two) times daily with a meal.    [provider]  docusate sodium (COLACE) 50 MG capsule Take 50 mg by mouth daily as needed for mild constipation or moderate constipation.    [provider]  furosemide (LASIX) 20 MG tablet Take 20 mg by mouth.    [provider]  Ipratropium-Albuterol (COMBIVENT RESPIMAT)  20-100 MCG/ACT AERS respimat Inhale 1 puff into the lungs every 6 (six) hours as needed for wheezing or shortness of breath (or coughing). 04/10/17   [name], MD  rivaroxaban (XARELTO) 10 MG TABS tablet Take 1 tablet (10 mg total) by mouth daily. Start 12/29/19 03/19/20   Laurey Morale, MD  sacubitril-valsartan (ENTRESTO) 49-51 MG Take 1 tablet by mouth 2 (two) times daily. 03/19/20   Laurey Morale, MD  sildenafil (VIAGRA) 100 MG tablet Take 100 mg by mouth as needed. 02/19/21   [provider]      Allergies    Lisinopril    Review of Systems   Review of Systems  Physical Exam Updated Vital Signs BP 130/87   Pulse 80   Temp 97.7 F (36.5 C)   Resp 16   Ht 6\' 1"  (1.854 m)   Wt 79.4 kg   SpO2 97%   BMI 23.09 kg/m  Physical Exam Vitals and nursing note reviewed.  Constitutional:      Appearance: He is well-developed. He is not ill-appearing.  HENT:     Head: Normocephalic and atraumatic.     Right Ear: External ear normal.     Left Ear: External ear normal.  Eyes:     Conjunctiva/sclera: Conjunctivae normal.     Pupils: Pupils are equal, round, and reactive to light.  Neck:     Trachea: Phonation  normal.  Cardiovascular:     Rate and Rhythm: Normal rate and regular rhythm.     Heart sounds: Normal heart sounds.  Pulmonary:     Effort: Pulmonary effort is normal. No respiratory distress.     Breath sounds: Normal breath sounds. No stridor.  Abdominal:     General: There is no distension.     Palpations: Abdomen is soft. There is no mass.     Tenderness: There is abdominal tenderness (Bilateral upper quadrants, mild). There is no guarding or rebound.  Musculoskeletal:        General: Normal range of motion.     Cervical back: Normal range of motion and neck supple.  Skin:    General: Skin is warm and dry.  Neurological:     Mental Status: He is alert and oriented to person, place, and time.     Cranial Nerves: No cranial nerve deficit.     Sensory: No  sensory deficit.     Motor: No abnormal muscle tone.     Coordination: Coordination normal.  Psychiatric:        Behavior: Behavior normal.        Thought Content: Thought content normal.        Judgment: Judgment normal.     ED Results / Procedures / Treatments   Labs (all labs ordered are listed, but only abnormal results are displayed) Labs Reviewed  COMPREHENSIVE METABOLIC PANEL - Abnormal; Notable for the following components:      Result Value   Potassium 5.2 (*)    BUN 46 (*)    Creatinine, Ser 2.32 (*)    Total Protein 8.5 (*)    GFR, Estimated 29 (*)    All other components within normal limits  CBC WITH DIFFERENTIAL/PLATELET  LACTIC ACID, PLASMA  LACTIC ACID, PLASMA    EKG None  Radiology CT ABDOMEN PELVIS WO CONTRAST  Result Date: 06/12/2022 CLINICAL DATA:  The patient states he went to the [place] yesterday and was told to come to the ED due to an abnormal CT scan of the abdomen. Complains of left-greater-than-right upper abdominal pain and diarrhea. EXAM: CT ABDOMEN AND PELVIS WITHOUT CONTRAST TECHNIQUE: Multidetector CT imaging of the abdomen and pelvis was performed following the standard protocol without IV contrast. RADIATION DOSE REDUCTION: This exam was performed according to the departmental dose-optimization program which includes automated exposure control, adjustment of the mA and/or kV according to patient size and/or use of iterative reconstruction technique. COMPARISON:  Last CT on this patient in our system was high-resolution chest CT 12/24/2019 FINDINGS: Lower chest: The cardiac size is normal. There is artifact from a single cardiac assist device lead in the right ventricle, not present previously with interval decreased size of the heart. There is left lower lobe posterior basal chronic pleural-parenchymal scarring and adhesions. There is linear calcification along the left lateral basal pleural surface. Lung bases are otherwise clear. Hepatobiliary: There  is hyperdense layering material in the proximal gallbladder which could be tiny layering stones or vicariously excreted contrast. There is no wall thickening or biliary dilatation. The unenhanced liver is unremarkable. Pancreas: No focal abnormality is seen without contrast no adjacent inflammatory change. Spleen: Unremarkable without contrast. Adrenals/Urinary Tract: There is no adrenal mass. There is symmetric contrast enhancement of the renal cortex. We do not know details of the patient's recent outside imaging but presumably contrast was administered. Retained cortical contrast at this point is worrisome for contrast nephrotoxicity. Clinical correlation is advised. There are a  few subcentimeter hypodensities in the bilateral renal cortex which are too small to characterize but probably cysts. There is no urinary stone or obstruction. There is contrast in the bladder and mild thickening of the bladder without inflammatory changes, possibly related to prostatomegaly. Stomach/Bowel: Unremarkable gastric wall. Unremarkable unopacified small bowel. There is contrast in the ascending, transverse and descending colon. Contrast also extends into the appendix which is prominent in size measuring 10.3 mm, but the contrast extends all the way to the tip of the appendix there is no inflammatory change. Stranding is seen however, alongside the cecum and proximal ascending colon with slight wall thickening concerning for colitis. Advanced diverticulosis in the distal descending and sigmoid segment is seen but there are no inflammatory changes associated with it. There is a short segment of the mid sigmoid colon however, which appears to show circumferential wall thickening on series 2 axial images 41-44, warranting colonoscopy follow-up after treatment to exclude underlying lesion. No abnormality seen of the rectum. Vascular/Lymphatic: There is moderate aortoiliac calcific plaque without AAA. No adenopathy is seen.  Reproductive: Enlarged prostate, measures 4.8 cm transverse. Other: There is no free air, free hemorrhage or free fluid. Small umbilical and left inguinal fat hernias are noted with evidence of prior right inguinal hernia repair. Musculoskeletal: There is osteopenia and degenerative change of the lumbar spine. There is partial ankylosis across the collapsed discs of L1-2 and L5-S1, moderate marginal osteophytosis throughout. There are small synovial herniation cysts in the right femoral neck, mild-to-moderate bilateral hip DJD. IMPRESSION: 1. Stranding along the cecum and proximal ascending colon and slight wall thickening concerning for colitis. 2. Prominent appendix, but contains contrast throughout its length with no inflammatory changes. Correlate clinically to exclude early appendicitis. 3. Contrast enhancement persisting in the bilateral renal cortex despite excretion into the bladder. This is worrisome for contrast nephrotoxicity. Clinical correlation advised. 4. Hyperdense material in the proximal gallbladder which could be tiny stones or layering vicariously excreted contrast. No wall thickening. 5. Bilateral small renal cortical hypodensities which are too small to characterize. 6. Sigmoid diverticula. No evidence of diverticulitis, but a short-segment of the mid sigmoid colon does appear to show circumferential wall thickening. Follow-up colonoscopy recommended to exclude underlying lesion. 7. Prostatomegaly with mild bladder thickening which is probably due to hypertrophy. No inflammatory change. 8. Aortic atherosclerosis and additional findings described above. Electronically Signed   By: Almira Bar M.D.   On: 06/12/2022 20:22    Procedures Procedures    Medications Ordered in ED Medications  0.9 %  sodium chloride infusion ( Intravenous New Bag/Given 06/12/22 1929)  oxyCODONE-acetaminophen (PERCOCET/ROXICET) 5-325 MG per tablet 1 tablet (1 tablet Oral Given 06/12/22 1622)  sodium chloride  0.9 % bolus 1,000 mL (0 mLs Intravenous Stopped 06/12/22 2042)  fentaNYL (SUBLIMAZE) injection 50 mcg (50 mcg Intravenous Given 06/12/22 2126)    ED Course/ Medical Decision Making/ A&P                           Medical Decision Making Patient presenting with nonspecific symptoms and reported abnormal CT scan.  Unable to view images through the EMR so they will be done with a repeat CT scan.  Will update labs and treat pain with Percocet.  Differential diagnosis includes enteritis, colitis, appendicitis, ileitis and gastroenteritis.  Patient is nontoxic in appearance.  He has a complex history of multiple problems including non-ischemic cardiomyopathy, congestive heart failure, cardiac defibrillator, polysubstance abuse, agent orange syndrome  Amount and/or Complexity of Data Reviewed Independent Historian:     Details: He is a cogent historian External Data Reviewed: notes.    Details: Patient has documentation regarding CT scan done at the [place] in the hospital yesterday.  It indicates that he has diffuse nonspecific colitis and possible appendicitis however not strongly suspected. Labs: ordered.    Details: CBC, metabolic panel --normal except BUN high, creatinine high, potassium high, GFR low Radiology: ordered and independent interpretation performed.    Details: CT scan abdomen pelvis without contrast, ascending colitis, sigmoid colon thickening, possibly consistent with colitis.  Diffuse diverticulosis.  No clear evidence for appendicitis but appendix is dilated.  Prostate enlarged, without urinary bladder distention. Discussion of management or test interpretation with external provider(s): Case discussed with nephrologist, Dr. Allena Katz regarding contrast present in the kidneys.  He does not have concerns for acute nephrotoxicity.  Case discussed with general surgery, Dr. Gerrit Friends who will have his partner see the patient tomorrow.  Case discussed with hospitalist to admit the  patient.  Risk Prescription drug management. Decision regarding hospitalization. Risk Details: Patient presenting with malaise, and decreased oral appetite with abdominal pain.  CT consistent with colitis which is nonspecific.  He is not having diarrhea, vomiting or fever.  White count is normal.  Consider ischemic colitis due to age and prior history of cardiovascular disease.  Incidental creatinine elevation with mild hyperkalemia.  Patient treated with IV fluids for that.  Nephrologist was consulted concerning possible for toxicity.  He doubts that that is present.  General surgery consulted for discussion of abnormal.  Appendix.  He will have his service evaluate the patient tomorrow and recommends hospitalization with consideration for antibiotics for colitis and fluid resuscitation.  Note that the patient previously had a colonoscopy about 2 years ago at the [facility] which he states was "normal."  Patient requires hospitalization for monitoring and stabilization.  Critical Care Total time providing critical care: 45 minutes           Final Clinical Impression(s) / ED Diagnoses Final diagnoses:  Colitis  AKI (acute kidney injury) (HCC)  Hyperkalemia  Dehydration    Rx / DC Orders ED Discharge Orders     None         Mancel Bale, MD 06/12/22 2128

## 2022-06-12 NOTE — H&P (Signed)
History and Physical    Patient: Brandon Frye JAS:[id number] DOB: [birth year] DOA: 06/12/2022 DOS: the patient was seen and examined on 06/12/2022 PCP: [facility]  Patient coming from: Home  Chief Complaint:  Chief Complaint  Patient presents with   Abdominal Pain   HPI: Brandon Frye is a 76 y.o. male with medical history significant of combined systolic and diastolic HF, NSVT s/p ICD, HTN, COPD who presents with abdominal pain and diarrhea.   Symptoms started about 4 days ago. He ate some chicken and squash and a few hours later developed cramping abdominal pain.Started to take Tums. Few days later started to have persistent diarrhea. Had decrease appetite. He also was started on Clindamycin last week after debridement for ingrown toe nail.   Pt presented to [place] in [facility] [facility] yesterday and had CT Abd/Pelvis with contrast showing mesenteric stranding along the cecum concerning for acute on chronic colitis. However he left before any treatment. He then also presented to [facility] yesterday and today but left both times before treatment.   In the ED, he was afebrile, normotensive on room air.  No leukocytosis or anemia.  Creatinine with AKI versus worsening from 1.93 yesterday to 2.32 on presentation.  Potassium of 5.2. UA is negative  In the ED, he was afebrile, normotensive on room air.  He had repeat CT abdomen pelvis without contrast here again confirms colitis with stranding along the cecum and proximal ascending colon. There was concerns of possible early appendicitis but this was revealed by general surgery Dr. Gerrit Friends who does not recommend any intervention overnight but will see in consultation in the morning.  There is also concerns of contrast nephrotoxicity.  EDP discussed with nephrology Dr. Allena Katz who doubts it is present.   He was given IV NS bolus and started on continuous IV fluid. Hospitalist then consulted for admission.   Review of Systems: As  mentioned in the history of present illness. All other systems reviewed and are negative. Past Medical History:  Diagnosis Date   AICD (automatic cardioverter/defibrillator) present    Arthritis    hands, hip knees ankles,back   Chronic systolic CHF (congestive heart failure) (HCC)    DVT (deep venous thrombosis) (HCC)    Non-ischemic cardiomyopathy (HCC) 2017   clean cath, EF 30%   Past Surgical History:  Procedure Laterality Date   CARDIAC CATHETERIZATION  2017   at [facility] in [place], clean, EF 30%   ICD IMPLANT N/A 12/26/2019   Procedure: ICD IMPLANT;  Surgeon: Duke Salvia, MD;  Location: [facility] INVASIVE CV LAB;  Service: Cardiovascular;  Laterality: N/A;   INGUINAL HERNIA REPAIR Right 06/11/2020   Procedure: OPEN RIGHT INGUINAL HERNIA REPAIR WITH TAP BLOCK;  Surgeon: Berna Bue, MD;  Location: [facility] ORS;  Service: General;  Laterality: Right;   INSERTION OF MESH Right 06/11/2020   Procedure: INSERTION OF MESH;  Surgeon: Berna Bue, MD;  Location: [facility] ORS;  Service: General;  Laterality: Right;   KNEE SURGERY     Arthroscopic Left knee   RIGHT/LEFT HEART CATH AND CORONARY ANGIOGRAPHY N/A 12/23/2019   Procedure: RIGHT/LEFT HEART CATH AND CORONARY ANGIOGRAPHY;  Surgeon: Laurey Morale, MD;  Location: [facility] INVASIVE CV LAB;  Service: Cardiovascular;  Laterality: N/A;   Social History:  reports that he has been smoking cigarettes. He has a 25.00 pack-year smoking history. He has never used smokeless tobacco. He reports current alcohol use. He reports current drug use. Drug: Marijuana. Denies cocaine use.  Smokes 1/2 pack  daily.  Allergies  Allergen Reactions   Lisinopril Cough    Family History  Problem Relation Age of Onset   Stroke Mother        Multiple strokes, died at 25   Heart attack Father 83    Prior to Admission medications   Medication Sig Start Date End Date Taking? Authorizing Provider  albuterol (VENTOLIN HFA) 108 (90 Base) MCG/ACT inhaler Inhale 2 puffs  into the lungs every 6 (six) hours as needed for wheezing or shortness of breath.     [provider]  aspirin EC 81 MG tablet Take 81 mg by mouth daily.    [provider]  brimonidine (ALPHAGAN P) 0.1 % SOLN INSTILL 1 DROP IN [facility] EYE TWICE A DAY 02/07/21   [provider]  Calcium Polycarbophil (FIBER) 625 MG TABS Take 1 tablet by mouth daily. 11/22/20   [provider]  docusate sodium (COLACE) 50 MG capsule Take 50 mg by mouth daily as needed for mild constipation or moderate constipation.    [provider]  furosemide (LASIX) 20 MG tablet Take 20 mg by mouth.    [provider]  Ipratropium-Albuterol (COMBIVENT RESPIMAT) 20-100 MCG/ACT AERS respimat Inhale 1 puff into the lungs every 6 (six) hours as needed for wheezing or shortness of breath (or coughing). 04/10/17   [name], MD  rivaroxaban (XARELTO) 10 MG TABS tablet Take 1 tablet (10 mg total) by mouth daily. Start 12/29/19 03/19/20   Laurey Morale, MD  sacubitril-valsartan (ENTRESTO) 49-51 MG Take 1 tablet by mouth 2 (two) times daily. 03/19/20   Laurey Morale, MD  sildenafil (VIAGRA) 100 MG tablet Take 100 mg by mouth as needed. 02/19/21   [provider]    Physical Exam: Vitals:   06/12/22 2000 06/12/22 2100 06/12/22 2200 06/12/22 2234  BP: 126/87 130/87 128/80   Pulse: 77 80 68   Resp: 14 16 18    Temp:    98.2 F (36.8 C)  TempSrc:      SpO2: 99% 97% 96%   Weight:      Height:       Constitutional: NAD, calm, comfortable, non-toxic appearing elderly gentleman appearing younger than stated age laying upright in bed watching TV Eyes: lids and conjunctivae normal ENMT: Mucous membranes are moist.  Neck: normal, supple,  Respiratory: clear to auscultation bilaterally, no wheezing, no crackles. Normal respiratory effort. No accessory muscle use.  Cardiovascular: Regular rate and rhythm, no murmurs / rubs / gallops. No extremity edema. Abdomen: soft,  non-distended, mild tenderness to right lateral mid-axillary region along lower rib cage, no masses palpated.  Bowel sounds positive.  Musculoskeletal: no clubbing / cyanosis. No joint deformity upper and lower extremities. Good ROM, no contractures. Normal muscle tone.  Skin: no rashes, lesions, ulcers. No induration Neurologic: CN 2-12 grossly intact. Strength 5/5 in all 4.  Psychiatric: Normal judgment and insight. Alert and oriented x 3. Normal mood. Data Reviewed:  See HPI  Assessment and Plan: * Colitis -start on IV Rocephin and Flagyl. -full liquid diet for bowel rest -check GI stool panel and C.diff testing -There is a short-segment of the mid sigmoid colon does appear to show circumferential wall thickening. Follow-up colonoscopy recommended to exclude underlying lesion. Reportedly had colonoscopy in 2020 at [facility] that was "negative."  -There was also concerns of possible early appendicitis on imaging but does not appear to correlate with exam. General surgery Dr. [facility] is aware and will see in consultation tomorrow  Open toe wound, subsequent encounter Has small wound from ingrown nail removal procedure on left great toe. Will discontinue recently started Clindamycin. -wound care daily per RN  Hyperkalemia K of 5.3. Give Lokelma x1.  History of DVT (deep vein thrombosis) Hx of recurrent DVT. Continue Xarelto for now but will need to monitor Cr Clearance closely with AKI  Chronic heart failure ([place]) Last echo with EF of 60-65% and grade 1 diastolic dysfunction on [id number]. Stable.  AKI (acute kidney injury) ([place]) Creatinine with AKI versus worsening from 1.93 yesterday to 2.32 on presentation.  -Likely pre-renal from GI symptoms. -Hold home Lasix and Entresto -keep on IV LR fluid continuous overnight -CT imaging had concerns of contrast nephrotoxicity by nephrology Dr. Posey Pronto is not concerned.   ICD (implantable cardioverter-defibrillator) in place Due to cardiomyopathy and  hx of NSVT  COPD (chronic obstructive pulmonary disease) ([place]) stable      Advance Care Planning:   Code Status: Full Code Full  Consults: General surgery, EDP also curbsided nephrology  Family Communication: none at bedside   Severity of Illness: The appropriate patient status for this patient is INPATIENT. Inpatient status is judged to be reasonable and necessary in order to provide the required intensity of service to ensure the patient's safety. The patient's presenting symptoms, physical exam findings, and initial radiographic and laboratory data in the context of their chronic comorbidities is felt to place them at high risk for further clinical deterioration. Furthermore, it is not anticipated that the patient will be medically stable for discharge from the hospital within 2 midnights of admission.   * I certify that at the point of admission it is my clinical judgment that the patient will require inpatient hospital care spanning beyond 2 midnights from the point of admission due to high intensity of service, high risk for further deterioration and high frequency of surveillance required.*  Author: Orene Desanctis, DO 06/12/2022 11:48 PM  For on call review [email or web address].

## 2022-06-12 NOTE — ED Provider Triage Note (Signed)
Emergency Medicine Provider Triage Evaluation Note  Brandon Frye , a 76 y.o. male  was evaluated in triage.  Pt complains of RLQ abdominal pain. Seen twice in the last 24 hours for same and left without being seen due to wait times. Had CT scan at the [place] that showed colitis but also stated that appendicitis cannot be ruled out. Was told to come to the ER for same. He has paperwork with him with CT read. States that his pain is persistent and unchanged for the past 4 days. Endorses associated diarrhea without nausea or vomiting.  Review of Systems  Positive:  Negative:   Physical Exam  BP 121/78 (BP Location: Left Arm)   Pulse 66   Temp 97.8 F (36.6 C) (Oral)   Resp 18   Ht 6\' 1"  (1.854 m)   Wt 79.4 kg   SpO2 97%   BMI 23.09 kg/m  Gen:   Awake, no distress   Resp:  Normal effort  MSK:   Moves extremities without difficulty  Other:  RLQ tenderness  Medical Decision Making  Medically screening exam initiated at 3:11 PM.  Appropriate orders placed.  Elery Cadenhead was informed that the remainder of the evaluation will be completed by another provider, this initial triage assessment does not replace that evaluation, and the importance of remaining in the ED until their evaluation is complete.     [name], PA-C 06/12/22 1514

## 2022-06-12 NOTE — Assessment & Plan Note (Signed)
Has small wound from ingrown nail removal procedure on left great toe. Will discontinue recently started Clindamycin. -wound care daily per RN

## 2022-06-12 NOTE — ED Notes (Signed)
Patient decided to leave stated that he couldn't wait any later

## 2022-06-13 DIAGNOSIS — K529 Noninfective gastroenteritis and colitis, unspecified: Secondary | ICD-10-CM | POA: Diagnosis not present

## 2022-06-13 LAB — BASIC METABOLIC PANEL
Anion gap: 4 — ABNORMAL LOW (ref 5–15)
BUN: 33 mg/dL — ABNORMAL HIGH (ref 8–23)
CO2: 24 mmol/L (ref 22–32)
Calcium: 8.5 mg/dL — ABNORMAL LOW (ref 8.9–10.3)
Chloride: 112 mmol/L — ABNORMAL HIGH (ref 98–111)
Creatinine, Ser: 1.71 mg/dL — ABNORMAL HIGH (ref 0.61–1.24)
GFR, Estimated: 42 mL/min — ABNORMAL LOW (ref >60)
Glucose, Bld: 79 mg/dL (ref 70–99)
Potassium: 4.3 mmol/L (ref 3.5–5.1)
Sodium: 140 mmol/L (ref 135–145)

## 2022-06-13 LAB — C DIFFICILE QUICK SCREEN W PCR REFLEX
C Diff antigen: POSITIVE — AB
C Diff toxin: NEGATIVE

## 2022-06-13 LAB — CLOSTRIDIUM DIFFICILE BY PCR, REFLEXED: Toxigenic C. Difficile by PCR: NEGATIVE

## 2022-06-13 NOTE — Progress Notes (Signed)
PROGRESS NOTE  Brandon Frye  IZT:[id number] DOB: 1946/02/15 DOA: 06/12/2022 PCP: [facility]   Brief Narrative:  Patient is a 76 year old male with history of diastolic congestive heart failure, nonsustained V. tach status post ICD, hypertension, COPD who presented from home with complaint of abdomen pain, diarrhea that started about 4 days ago after he ate chicken, squash followed by cramping abdominal pain, decreased appetite.  He also recently took clindamycin after debridement of ingrowing toenail.  He did CT abdomen/pelvis at [facility] which showed possible colitis and he was sent to the emergency department.  On presentation he was afebrile, normotensive.  Lab work showed acute kidney injury, hyperkalemia.  Repeat CT abdomen/pelvis showed colitis with stranding along the cecum, proximal ascending colon.  There was also suspicion for already appendicitis, general surgery consulted.  General surgery ruled out appendicitis and has signed off.  GI pathogen panel, C. difficile pending.  Continue IV fluids.    Assessment & Plan:  Principal Problem:   Colitis Active Problems:   COPD (chronic obstructive pulmonary disease) (HCC)   ICD (implantable cardioverter-defibrillator) in place   AKI (acute kidney injury) (HCC)   Chronic heart failure (HCC)   History of DVT (deep vein thrombosis)   Hyperkalemia   Open toe wound, subsequent encounter    Colitis: Presented with abdomen pain, diarrhea.  CT abdomen/pelvis as above. Currently on  liquid diet.  Pending GI pathogen panel, C. difficile.But no BM. There was also concern for early appendicitis which does not correlate with exam.  General surgery signed off ruling out appendicitis. Continue supportive care.  Continue gentle IV fluids  Hyperkalemia: Currently potassium stable.  History of DVT: On Xarelto at home.  Currently on hold, will resume when appropriate.  AKI on CKD stage IIIa: Baseline creatinine around 1.5-1.8.   Presented with creatinine in the range of 2.  Continue gentle IV fluids.  Chronic diastolic CHF: Last echo showed EF of 60 to 65%, grade 1 diastolic dysfunction.  Continue gentle IV fluids for now due to concern of hypovolemia from dehydration secondary to diarrhea.  History of NSVT: Status post ICD  COPD: Currently stable without exacerbation.       DVT prophylaxis:rivaroxaban (XARELTO) tablet 10 mg Start: 06/13/22 1000 rivaroxaban (XARELTO) tablet 10 mg     Code Status: Full Code  Family Communication: None at bedside  Patient status:Inpatient  Patient is from :Home  Anticipated discharge JA:SNKN  Estimated DC date:tomorrow   Consultants: None  Procedures:None  Antimicrobials:  Anti-infectives (From admission, onward)    Start     Dose/Rate Route Frequency Ordered Stop   06/12/22 2230  cefTRIAXone (ROCEPHIN) 2 g in sodium chloride 0.9 % 100 mL IVPB        2 g 200 mL/hr over 30 Minutes Intravenous Every 24 hours 06/12/22 2221     06/12/22 2230  metroNIDAZOLE (FLAGYL) IVPB 500 mg        500 mg 100 mL/hr over 60 Minutes Intravenous Every 12 hours 06/12/22 2221         Subjective: Patient seen and examined at the bedside this morning.  During my evaluation, he was very comfortable, lying in bed.  He denies any abdominal pain.  Diarrhea has stopped.  Last diarrhea was yesterday.  No nausea or vomiting.  Objective: Vitals:   06/13/22 0515 06/13/22 0530 06/13/22 0600 06/13/22 0825  BP: (!) 128/93 125/73 123/74   Pulse: 62 (!) 57 (!) 57   Resp:  16 16   Temp:  98 F (36.7 C)  TempSrc:    Oral  SpO2: 96% 94% 95%   Weight:      Height:        Intake/Output Summary (Last 24 hours) at 06/13/2022 0840 Last data filed at 06/13/2022 0039 Gross per 24 hour  Intake 1845 ml  Output --  Net 1845 ml   Filed Weights   06/12/22 1500  Weight: 79.4 kg    Examination:  General exam: Overall comfortable, not in distress HEENT: PERRL Respiratory system:  no  wheezes or crackles  Cardiovascular system: S1 & S2 heard, RRR.  Gastrointestinal system: Abdomen is nondistended, soft and nontender. Central nervous system: Alert and oriented Extremities: No edema, no clubbing ,no cyanosis, left ingrowing toenail wound Skin: No rashes, no ulcers,no icterus     Data Reviewed: I have personally reviewed following labs and imaging studies  CBC: Recent Labs  Lab 06/11/22 1609 06/12/22 0520 06/12/22 1628  WBC 6.3 5.4 6.3  NEUTROABS 3.7 3.2 3.4  HGB 14.2 13.3 14.3  HCT 42.2 38.8* 43.5  MCV 87.7 86.6 89.9  PLT 192 179 [id number]   Basic Metabolic Panel: Recent Labs  Lab 06/11/22 1609 06/12/22 0520 06/12/22 1731 06/13/22 0616  NA 136 135 137 140  K 4.9 4.4 5.2* 4.3  CL 102 103 104 112*  CO2 24 23 25 24   GLUCOSE 80 117* 82 79  BUN 33* 40* 46* 33*  CREATININE 1.93* 2.20* 2.32* 1.71*  CALCIUM 9.4 9.2 9.6 8.5*     No results found for this or any previous visit (from the past 240 hour(s)).   Radiology Studies: CT ABDOMEN PELVIS WO CONTRAST  Result Date: 06/12/2022 CLINICAL DATA:  The patient states he went to the [place] yesterday and was told to come to the ED due to an abnormal CT scan of the abdomen. Complains of left-greater-than-right upper abdominal pain and diarrhea. EXAM: CT ABDOMEN AND PELVIS WITHOUT CONTRAST TECHNIQUE: Multidetector CT imaging of the abdomen and pelvis was performed following the standard protocol without IV contrast. RADIATION DOSE REDUCTION: This exam was performed according to the departmental dose-optimization program which includes automated exposure control, adjustment of the mA and/or kV according to patient size and/or use of iterative reconstruction technique. COMPARISON:  Last CT on this patient in our system was high-resolution chest CT 12/24/2019 FINDINGS: Lower chest: The cardiac size is normal. There is artifact from a single cardiac assist device lead in the right ventricle, not present previously with interval  decreased size of the heart. There is left lower lobe posterior basal chronic pleural-parenchymal scarring and adhesions. There is linear calcification along the left lateral basal pleural surface. Lung bases are otherwise clear. Hepatobiliary: There is hyperdense layering material in the proximal gallbladder which could be tiny layering stones or vicariously excreted contrast. There is no wall thickening or biliary dilatation. The unenhanced liver is unremarkable. Pancreas: No focal abnormality is seen without contrast no adjacent inflammatory change. Spleen: Unremarkable without contrast. Adrenals/Urinary Tract: There is no adrenal mass. There is symmetric contrast enhancement of the renal cortex. We do not know details of the patient's recent outside imaging but presumably contrast was administered. Retained cortical contrast at this point is worrisome for contrast nephrotoxicity. Clinical correlation is advised. There are a few subcentimeter hypodensities in the bilateral renal cortex which are too small to characterize but probably cysts. There is no urinary stone or obstruction. There is contrast in the bladder and mild thickening of the bladder without inflammatory changes, possibly related to  prostatomegaly. Stomach/Bowel: Unremarkable gastric wall. Unremarkable unopacified small bowel. There is contrast in the ascending, transverse and descending colon. Contrast also extends into the appendix which is prominent in size measuring 10.3 mm, but the contrast extends all the way to the tip of the appendix there is no inflammatory change. Stranding is seen however, alongside the cecum and proximal ascending colon with slight wall thickening concerning for colitis. Advanced diverticulosis in the distal descending and sigmoid segment is seen but there are no inflammatory changes associated with it. There is a short segment of the mid sigmoid colon however, which appears to show circumferential wall thickening on  series 2 axial images 41-44, warranting colonoscopy follow-up after treatment to exclude underlying lesion. No abnormality seen of the rectum. Vascular/Lymphatic: There is moderate aortoiliac calcific plaque without AAA. No adenopathy is seen. Reproductive: Enlarged prostate, measures 4.8 cm transverse. Other: There is no free air, free hemorrhage or free fluid. Small umbilical and left inguinal fat hernias are noted with evidence of prior right inguinal hernia repair. Musculoskeletal: There is osteopenia and degenerative change of the lumbar spine. There is partial ankylosis across the collapsed discs of L1-2 and L5-S1, moderate marginal osteophytosis throughout. There are small synovial herniation cysts in the right femoral neck, mild-to-moderate bilateral hip DJD. IMPRESSION: 1. Stranding along the cecum and proximal ascending colon and slight wall thickening concerning for colitis. 2. Prominent appendix, but contains contrast throughout its length with no inflammatory changes. Correlate clinically to exclude early appendicitis. 3. Contrast enhancement persisting in the bilateral renal cortex despite excretion into the bladder. This is worrisome for contrast nephrotoxicity. Clinical correlation advised. 4. Hyperdense material in the proximal gallbladder which could be tiny stones or layering vicariously excreted contrast. No wall thickening. 5. Bilateral small renal cortical hypodensities which are too small to characterize. 6. Sigmoid diverticula. No evidence of diverticulitis, but a short-segment of the mid sigmoid colon does appear to show circumferential wall thickening. Follow-up colonoscopy recommended to exclude underlying lesion. 7. Prostatomegaly with mild bladder thickening which is probably due to hypertrophy. No inflammatory change. 8. Aortic atherosclerosis and additional findings described above. Electronically Signed   By: Almira Bar M.D.   On: 06/12/2022 20:22    Scheduled Meds:  aspirin EC   81 mg Oral Daily   atorvastatin  40 mg Oral Daily   brimonidine  1 drop Both Eyes BID   carvedilol  12.5 mg Oral BID WC   fluticasone  1 spray Each Nare Daily   gabapentin  300 mg Oral QHS   loratadine  10 mg Oral Daily   rivaroxaban  10 mg Oral Daily   Continuous Infusions:  sodium chloride 10 mL/hr at 06/12/22 2245   cefTRIAXone (ROCEPHIN)  IV Stopped (06/12/22 2317)   lactated ringers 75 mL/hr at 06/12/22 2337   metronidazole Stopped (06/13/22 0039)     LOS: 1 day   Burnadette Pop, MD [facility] P8/09/2022, 8:40 AM

## 2022-06-13 NOTE — Consult Note (Signed)
Consult Note  Brandon Frye 14-Aug-1946  [id number].    Requesting MD: Mancel Bale, MD Chief Complaint/Reason for Consult: RLQ abdominal pain  HPI:  Patient is a 76 year old male who presented to the ED with abdominal pain and diarrhea that started 4 days prior to presentation. Patient reports this started a few hrs after eating some some chicken and squash. He also was started on clindamycin recently for ingrown toe nail but symptoms had started prior to this. He denies fever, chills, chest pain, SOB, nausea or vomiting and actually reports he currently feels significantly improved. No further diarrhea since being in the ED here. He also went to the ED at the [facility] [place] [facility] yesterday but left before treatment at either place. He was admitted to the medical service overnight and general surgery asked to evaluate.   PMH otherwise significant for COPD, combined chronic CHF, NSVT s/p ICD, HTN. Prior abdominal surgery includes RIH repair with mesh. Takes xarelto.   ROS: Negative other than HPI.   Family History  Problem Relation Age of Onset   Stroke Mother        Multiple strokes, died at 49   Heart attack Father 78    Past Medical History:  Diagnosis Date   AICD (automatic cardioverter/defibrillator) present    Arthritis    hands, hip knees ankles,back   Chronic systolic CHF (congestive heart failure) (HCC)    DVT (deep venous thrombosis) (HCC)    Non-ischemic cardiomyopathy (HCC) 2017   clean cath, EF 30%    Past Surgical History:  Procedure Laterality Date   CARDIAC CATHETERIZATION  2017   at [facility] in [place], clean, EF 30%   ICD IMPLANT N/A 12/26/2019   Procedure: ICD IMPLANT;  Surgeon: Duke Salvia, MD;  Location: [facility] INVASIVE CV LAB;  Service: Cardiovascular;  Laterality: N/A;   INGUINAL HERNIA REPAIR Right 06/11/2020   Procedure: OPEN RIGHT INGUINAL HERNIA REPAIR WITH TAP BLOCK;  Surgeon: Berna Bue, MD;  Location: [facility] ORS;   Service: General;  Laterality: Right;   INSERTION OF MESH Right 06/11/2020   Procedure: INSERTION OF MESH;  Surgeon: Berna Bue, MD;  Location: [facility] ORS;  Service: General;  Laterality: Right;   KNEE SURGERY     Arthroscopic Left knee   RIGHT/LEFT HEART CATH AND CORONARY ANGIOGRAPHY N/A 12/23/2019   Procedure: RIGHT/LEFT HEART CATH AND CORONARY ANGIOGRAPHY;  Surgeon: Laurey Morale, MD;  Location: [facility] INVASIVE CV LAB;  Service: Cardiovascular;  Laterality: N/A;    Social History:  reports that he has been smoking cigarettes. He has a 25.00 pack-year smoking history. He has never used smokeless tobacco. He reports current alcohol use. He reports current drug use. Drug: Marijuana.  Allergies:  Allergies  Allergen Reactions   Lisinopril Cough    (Not in a hospital admission)   Blood pressure 123/74, pulse (!) 57, temperature 97.9 F (36.6 C), resp. rate 16, height 6\' 1"  (1.854 m), weight 79.4 kg, SpO2 95 %. Physical Exam:  General: pleasant, WD, WN male who is laying in bed in NAD HEENT: head is normocephalic, atraumatic.  Sclera are anicteric.  Ears and nose without any masses or lesions.  Mouth is pink and moist Heart: regular, rate, and rhythm.   Lungs: CTAB, no wheezes, rhonchi, or rales noted.  Respiratory effort nonlabored Abd: soft, NT, ND, +BS, no masses, hernias, or organomegaly MS: all 4 extremities are symmetrical with no cyanosis, clubbing, or edema. Skin: warm  and dry with no masses, lesions, or rashes Psych: A&Ox3 with an appropriate affect.   Results for orders placed or performed during the hospital encounter of 06/12/22 (from the past 48 hour(s))  CBC with Differential     Status: None   Collection Time: 06/12/22  4:28 PM  Result Value Ref Range   WBC 6.3 4.0 - 10.5 K/uL   RBC 4.84 4.22 - 5.81 MIL/uL   Hemoglobin 14.3 13.0 - 17.0 g/dL   HCT 10.1 75.1 - 02.5 %   MCV 89.9 80.0 - 100.0 fL   MCH 29.5 26.0 - 34.0 pg   MCHC 32.9 30.0 - 36.0 g/dL   RDW 85.2 77.8  - 24.2 %   Platelets 166 150 - 400 K/uL   nRBC 0.0 0.0 - 0.2 %   Neutrophils Relative % 53 %   Neutro Abs 3.4 1.7 - 7.7 K/uL   Lymphocytes Relative 27 %   Lymphs Abs 1.7 0.7 - 4.0 K/uL   Monocytes Relative 15 %   Monocytes Absolute 0.9 0.1 - 1.0 K/uL   Eosinophils Relative 3 %   Eosinophils Absolute 0.2 0.0 - 0.5 K/uL   Basophils Relative 1 %   Basophils Absolute 0.0 0.0 - 0.1 K/uL   Immature Granulocytes 1 %   Abs Immature Granulocytes 0.03 0.00 - 0.07 K/uL    Comment: Performed at [facility], [address], [place], [place] [address]  Comprehensive metabolic panel     Status: Abnormal   Collection Time: 06/12/22  5:31 PM  Result Value Ref Range   Sodium 137 135 - 145 mmol/L   Potassium 5.2 (H) 3.5 - 5.1 mmol/L   Chloride 104 98 - 111 mmol/L   CO2 25 22 - 32 mmol/L   Glucose, Bld 82 70 - 99 mg/dL    Comment: Glucose reference range applies only to samples taken after fasting for at least 8 hours.   BUN 46 (H) 8 - 23 mg/dL   Creatinine, Ser 4.43 (H) 0.61 - 1.24 mg/dL   Calcium 9.6 8.9 - 15.4 mg/dL   Total Protein 8.5 (H) 6.5 - 8.1 g/dL   Albumin 4.5 3.5 - 5.0 g/dL   AST 22 15 - 41 U/L   ALT 10 0 - 44 U/L   Alkaline Phosphatase 66 38 - 126 U/L   Total Bilirubin 0.8 0.3 - 1.2 mg/dL   GFR, Estimated 29 (L) >60 mL/min    Comment: (NOTE) Calculated using the CKD-EPI Creatinine Equation (2021)    Anion gap 8 5 - 15    Comment: Performed at [facility], [address] [place], [place] [address]  Lactic acid, plasma     Status: None   Collection Time: 06/12/22  9:35 PM  Result Value Ref Range   Lactic Acid, Venous 0.7 0.5 - 1.9 mmol/L    Comment: Performed at [facility], [address], [place], [place] [address]  Lactic acid, plasma     Status: None   Collection Time: 06/12/22 11:10 PM  Result Value Ref Range   Lactic Acid, Venous 0.8 0.5 - 1.9 mmol/L    Comment: Performed at [facility], [address], [place], [place] [address]  Basic metabolic panel     Status: Abnormal   Collection Time: 06/13/22  6:16 AM  Result Value Ref Range   Sodium 140 135 - 145 mmol/L   Potassium 4.3 3.5 - 5.1 mmol/L   Chloride 112 (H) 98 -  111 mmol/L   CO2 24 22 - 32 mmol/L   Glucose, Bld 79 70 - 99 mg/dL    Comment: Glucose reference range applies only to samples taken after fasting for at least 8 hours.   BUN 33 (H) 8 - 23 mg/dL   Creatinine, Ser 2.48 (H) 0.61 - 1.24 mg/dL   Calcium 8.5 (L) 8.9 - 10.3 mg/dL   GFR, Estimated 42 (L) >60 mL/min    Comment: (NOTE) Calculated using the CKD-EPI Creatinine Equation (2021)    Anion gap 4 (L) 5 - 15    Comment: Performed at [facility], [address], [place], [place] [address]   CT ABDOMEN PELVIS WO CONTRAST  Result Date: 06/12/2022 CLINICAL DATA:  The patient states he went to the [place] yesterday and was told to come to the ED due to an abnormal CT scan of the abdomen. Complains of left-greater-than-right upper abdominal pain and diarrhea. EXAM: CT ABDOMEN AND PELVIS WITHOUT CONTRAST TECHNIQUE: Multidetector CT imaging of the abdomen and pelvis was performed following the standard protocol without IV contrast. RADIATION DOSE REDUCTION: This exam was performed according to the departmental dose-optimization program which includes automated exposure control, adjustment of the mA and/or kV according to patient size and/or use of iterative reconstruction technique. COMPARISON:  Last CT on this patient in our system was high-resolution chest CT 12/24/2019 FINDINGS: Lower chest: The cardiac size is normal. There is artifact from a single cardiac assist device lead in the right ventricle, not present previously with interval decreased size of the heart. There is left lower lobe posterior basal chronic pleural-parenchymal scarring and adhesions. There is linear calcification along the left lateral basal pleural surface. Lung bases are otherwise  clear. Hepatobiliary: There is hyperdense layering material in the proximal gallbladder which could be tiny layering stones or vicariously excreted contrast. There is no wall thickening or biliary dilatation. The unenhanced liver is unremarkable. Pancreas: No focal abnormality is seen without contrast no adjacent inflammatory change. Spleen: Unremarkable without contrast. Adrenals/Urinary Tract: There is no adrenal mass. There is symmetric contrast enhancement of the renal cortex. We do not know details of the patient's recent outside imaging but presumably contrast was administered. Retained cortical contrast at this point is worrisome for contrast nephrotoxicity. Clinical correlation is advised. There are a few subcentimeter hypodensities in the bilateral renal cortex which are too small to characterize but probably cysts. There is no urinary stone or obstruction. There is contrast in the bladder and mild thickening of the bladder without inflammatory changes, possibly related to prostatomegaly. Stomach/Bowel: Unremarkable gastric wall. Unremarkable unopacified small bowel. There is contrast in the ascending, transverse and descending colon. Contrast also extends into the appendix which is prominent in size measuring 10.3 mm, but the contrast extends all the way to the tip of the appendix there is no inflammatory change. Stranding is seen however, alongside the cecum and proximal ascending colon with slight wall thickening concerning for colitis. Advanced diverticulosis in the distal descending and sigmoid segment is seen but there are no inflammatory changes associated with it. There is a short segment of the mid sigmoid colon however, which appears to show circumferential wall thickening on series 2 axial images 41-44, warranting colonoscopy follow-up after treatment to exclude underlying lesion. No abnormality seen of the rectum. Vascular/Lymphatic: There is moderate aortoiliac calcific plaque without AAA. No  adenopathy is seen. Reproductive: Enlarged prostate, measures 4.8 cm transverse. Other: There is no free air, free hemorrhage or free fluid. Small umbilical and  left inguinal fat hernias are noted with evidence of prior right inguinal hernia repair. Musculoskeletal: There is osteopenia and degenerative change of the lumbar spine. There is partial ankylosis across the collapsed discs of L1-2 and L5-S1, moderate marginal osteophytosis throughout. There are small synovial herniation cysts in the right femoral neck, mild-to-moderate bilateral hip DJD. IMPRESSION: 1. Stranding along the cecum and proximal ascending colon and slight wall thickening concerning for colitis. 2. Prominent appendix, but contains contrast throughout its length with no inflammatory changes. Correlate clinically to exclude early appendicitis. 3. Contrast enhancement persisting in the bilateral renal cortex despite excretion into the bladder. This is worrisome for contrast nephrotoxicity. Clinical correlation advised. 4. Hyperdense material in the proximal gallbladder which could be tiny stones or layering vicariously excreted contrast. No wall thickening. 5. Bilateral small renal cortical hypodensities which are too small to characterize. 6. Sigmoid diverticula. No evidence of diverticulitis, but a short-segment of the mid sigmoid colon does appear to show circumferential wall thickening. Follow-up colonoscopy recommended to exclude underlying lesion. 7. Prostatomegaly with mild bladder thickening which is probably due to hypertrophy. No inflammatory change. 8. Aortic atherosclerosis and additional findings described above. Electronically Signed   By: Almira Bar M.D.   On: 06/12/2022 20:22      Assessment/Plan Ascending colitis  - CT with stranding along cecum and proximal ascending colon and slight wall thickening, contrast fills appendix (which would argue against acute appendicitis) - no leukocytosis, afebrile - abdominal exam  reassuring - agree with FLD and GI panel, C.diff testing given clindamycin  - medical management of colitis per [facility] - no acute surgical issues, we will sign off. Please call if any further questions/concerns.   I reviewed ED provider notes, hospitalist notes, last 24 h vitals and pain scores, last 48 h intake and output, last 24 h labs and trends, and last 24 h imaging results.   Juliet Rude, [facility] 06/13/2022, 7:42 AM Please see Amion for pager number during day hours 7:00am-4:30pm

## 2022-06-14 DIAGNOSIS — K529 Noninfective gastroenteritis and colitis, unspecified: Secondary | ICD-10-CM | POA: Diagnosis not present

## 2022-06-14 LAB — CBC
HCT: 37.9 % — ABNORMAL LOW (ref 39.0–52.0)
Hemoglobin: 12.6 g/dL — ABNORMAL LOW (ref 13.0–17.0)
MCH: 29.4 pg (ref 26.0–34.0)
MCHC: 33.2 g/dL (ref 30.0–36.0)
MCV: 88.6 fL (ref 80.0–100.0)
Platelets: 172 10*3/uL (ref 150–400)
RBC: 4.28 MIL/uL (ref 4.22–5.81)
RDW: 13.6 % (ref 11.5–15.5)
WBC: 4.6 10*3/uL (ref 4.0–10.5)
nRBC: 0 % (ref 0.0–0.2)

## 2022-06-14 LAB — GASTROINTESTINAL PANEL BY PCR, STOOL (REPLACES STOOL CULTURE)

## 2022-06-14 LAB — BASIC METABOLIC PANEL
Anion gap: 7 (ref 5–15)
BUN: 16 mg/dL (ref 8–23)
CO2: 24 mmol/L (ref 22–32)
Calcium: 8.6 mg/dL — ABNORMAL LOW (ref 8.9–10.3)
Chloride: 108 mmol/L (ref 98–111)
Creatinine, Ser: 1.23 mg/dL (ref 0.61–1.24)
GFR, Estimated: >60 mL/min (ref >60)
Glucose, Bld: 80 mg/dL (ref 70–99)
Potassium: 4.2 mmol/L (ref 3.5–5.1)
Sodium: 139 mmol/L (ref 135–145)

## 2022-06-14 NOTE — Progress Notes (Signed)
PROGRESS NOTE  Brandon Frye  [id number] DOB: 1946-06-19 DOA: 06/12/2022 PCP: [facility]   Brief Narrative:  Patient is a 76 year old male with history of diastolic congestive heart failure, nonsustained V. tach status post ICD, hypertension, COPD who presented from home with complaint of abdomen pain, diarrhea that started about 4 days ago after he ate chicken, squash followed by cramping abdominal pain, decreased appetite.  He also recently took clindamycin after debridement of ingrowing toenail.  He did CT abdomen/pelvis at [facility] which showed possible colitis and he was sent to the emergency department.  On presentation he was afebrile, normotensive.  Lab work showed acute kidney injury, hyperkalemia.  Repeat CT abdomen/pelvis showed colitis with stranding along the cecum, proximal ascending colon.  There was also suspicion for already appendicitis, general surgery consulted.  General surgery ruled out appendicitis and has signed off.  GI pathogen panel pending, C. difficile negative. .    Assessment & Plan:  Principal Problem:   Colitis Active Problems:   COPD (chronic obstructive pulmonary disease) ([place])   ICD (implantable cardioverter-defibrillator) in place   AKI (acute kidney injury) ([place])   Chronic heart failure (HCC)   History of DVT (deep vein thrombosis)   Hyperkalemia   Open toe wound, subsequent encounter    Colitis: Presented with abdomen pain, diarrhea.  CT abdomen/pelvis as above C. difficile antigen positive but PCR negative.  GI pathogen panel pending.  Had an episode of loose bowel movement this morning. There was also concern for early appendicitis which does not correlate with exam.  General surgery signed off ruling out appendicitis. Continue supportive care.   Will wait for GI pathogen panel panel, continue antibiotics for today.  Hyperkalemia: Currently potassium stable.  History of DVT: On Xarelto at home.  Currently on hold, will  resume when appropriate.  AKI on CKD stage IIIa: Baseline creatinine around 1.5-1.8.  Presented with creatinine in the range of 2.  Currently kidney function at baseline  Chronic diastolic CHF: Last echo showed EF of 60 to [id number], grade 1 diastolic dysfunction.  Currently appears euvolemic  History of NSVT: Status post ICD  COPD: Currently stable without exacerbation.       DVT prophylaxis:rivaroxaban (XARELTO) tablet 10 mg Start: 06/13/22 1000 rivaroxaban (XARELTO) tablet 10 mg     Code Status: Full Code  Family Communication: Girlfriend at bedside  Patient status:Inpatient  Patient is from :Home  Anticipated discharge NE:[id number]  Estimated DC date:tomorrow   Consultants: None  Procedures:None  Antimicrobials:  Anti-infectives (From admission, onward)    Start     Dose/Rate Route Frequency Ordered Stop   06/12/22 2230  cefTRIAXone (ROCEPHIN) 2 g in sodium chloride 0.9 % 100 mL IVPB        2 g 200 mL/hr over 30 Minutes Intravenous Every 24 hours 06/12/22 2221     06/12/22 2230  metroNIDAZOLE (FLAGYL) IVPB 500 mg        500 mg 100 mL/hr over 60 Minutes Intravenous Every 12 hours 06/12/22 2221         Subjective:  Patient seen and examined at the bedside this morning.  Hemodynamically stable.  Appears comfortable, lying in bed.  Had a loose bowel movement this morning.  Denies any nausea, vomiting or worsening abdominal pain.  Objective: Vitals:   06/13/22 1016 06/13/22 1442 06/13/22 2054 06/14/22 0511  BP: 128/77 135/81 130/81 (!) 153/91  Pulse: 61 64 74 66  Resp: 18 18 18    Temp: 97.8 F (36.6 C) 97.7  F (36.5 C) 98.7 F (37.1 C) 97.8 F (36.6 C)  TempSrc: Oral Oral Oral Oral  SpO2: 97% 97% 94% 94%  Weight:      Height:        Intake/Output Summary (Last 24 hours) at 06/14/2022 1337 Last data filed at 06/14/2022 1159 Gross per 24 hour  Intake 731.08 ml  Output 526 ml  Net 205.08 ml   Filed Weights   06/12/22 1500  Weight: 79.4 kg     Examination:  General exam: Overall comfortable, not in distress HEENT: PERRL Respiratory system:  no wheezes or crackles  Cardiovascular system: S1 & S2 heard, RRR.  Gastrointestinal system: Abdomen is nondistended, soft and has mild generalized tenderness.  Bowel sounds present Central nervous system: Alert and oriented Extremities: No edema, no clubbing ,no cyanosis Skin: No rashes, no ulcers,no icterus     Data Reviewed: I have personally reviewed following labs and imaging studies  CBC: Recent Labs  Lab 06/11/22 1609 06/12/22 0520 06/12/22 1628 06/14/22 0549  WBC 6.3 5.4 6.3 4.6  NEUTROABS 3.7 3.2 3.4  --   HGB 14.2 13.3 14.3 12.6*  HCT 42.2 38.8* 43.5 37.9*  MCV 87.7 86.6 89.9 88.6  PLT 192 179 166 [id number]   Basic Metabolic Panel: Recent Labs  Lab 06/11/22 1609 06/12/22 0520 06/12/22 1731 06/13/22 0616 06/14/22 0549  NA 136 135 137 140 139  K 4.9 4.4 5.2* 4.3 4.2  CL 102 103 104 112* 108  CO2 24 23 25 24 24   GLUCOSE 80 117* 82 79 80  BUN 33* 40* 46* 33* 16  CREATININE 1.93* 2.20* 2.32* 1.71* 1.23  CALCIUM 9.4 9.2 9.6 8.5* 8.6*     Recent Results (from the past 240 hour(s))  C Difficile Quick Screen w PCR reflex     Status: Abnormal   Collection Time: 06/13/22 10:54 AM   Specimen: Stool  Result Value Ref Range Status   C Diff antigen POSITIVE (A) NEGATIVE Final   C Diff toxin NEGATIVE NEGATIVE Final   C Diff interpretation Results are indeterminate. See PCR results.  Final    Comment: Performed at [facility], [place] [address], [place], [place] [address]  C. Diff by PCR, Reflexed     Status: None   Collection Time: 06/13/22 10:54 AM  Result Value Ref Range Status   Toxigenic C. Difficile by PCR NEGATIVE NEGATIVE Final    Comment: Patient is colonized with non toxigenic C. difficile. May not need treatment unless significant symptoms are present. Performed at [facility], [place] [address] [place], [place] [address]       Radiology Studies: CT ABDOMEN PELVIS WO CONTRAST  Result Date: 06/12/2022 CLINICAL DATA:  The patient states he went to the [place] yesterday and was told to come to the ED due to an abnormal CT scan of the abdomen. Complains of left-greater-than-right upper abdominal pain and diarrhea. EXAM: CT ABDOMEN AND PELVIS WITHOUT CONTRAST TECHNIQUE: Multidetector CT imaging of the abdomen and pelvis was performed following the standard protocol without IV contrast. RADIATION DOSE REDUCTION: This exam was performed according to the departmental dose-optimization program which includes automated exposure control, adjustment of the mA and/or kV according to patient size and/or use of iterative reconstruction technique. COMPARISON:  Last CT on this patient in our system was high-resolution chest CT 12/24/2019 FINDINGS: Lower chest: The cardiac size is normal. There is artifact from a single cardiac assist device lead in the right ventricle, not present previously with interval  decreased size of the heart. There is left lower lobe posterior basal chronic pleural-parenchymal scarring and adhesions. There is linear calcification along the left lateral basal pleural surface. Lung bases are otherwise clear. Hepatobiliary: There is hyperdense layering material in the proximal gallbladder which could be tiny layering stones or vicariously excreted contrast. There is no wall thickening or biliary dilatation. The unenhanced liver is unremarkable. Pancreas: No focal abnormality is seen without contrast no adjacent inflammatory change. Spleen: Unremarkable without contrast. Adrenals/Urinary Tract: There is no adrenal mass. There is symmetric contrast enhancement of the renal cortex. We do not know details of the patient's recent outside imaging but presumably contrast was administered. Retained cortical contrast at this point is worrisome for contrast nephrotoxicity. Clinical correlation is advised. There are a few subcentimeter  hypodensities in the bilateral renal cortex which are too small to characterize but probably cysts. There is no urinary stone or obstruction. There is contrast in the bladder and mild thickening of the bladder without inflammatory changes, possibly related to prostatomegaly. Stomach/Bowel: Unremarkable gastric wall. Unremarkable unopacified small bowel. There is contrast in the ascending, transverse and descending colon. Contrast also extends into the appendix which is prominent in size measuring 10.3 mm, but the contrast extends all the way to the tip of the appendix there is no inflammatory change. Stranding is seen however, alongside the cecum and proximal ascending colon with slight wall thickening concerning for colitis. Advanced diverticulosis in the distal descending and sigmoid segment is seen but there are no inflammatory changes associated with it. There is a short segment of the mid sigmoid colon however, which appears to show circumferential wall thickening on series 2 axial images 41-44, warranting colonoscopy follow-up after treatment to exclude underlying lesion. No abnormality seen of the rectum. Vascular/Lymphatic: There is moderate aortoiliac calcific plaque without AAA. No adenopathy is seen. Reproductive: Enlarged prostate, measures 4.8 cm transverse. Other: There is no free air, free hemorrhage or free fluid. Small umbilical and left inguinal fat hernias are noted with evidence of prior right inguinal hernia repair. Musculoskeletal: There is osteopenia and degenerative change of the lumbar spine. There is partial ankylosis across the collapsed discs of L1-2 and L5-S1, moderate marginal osteophytosis throughout. There are small synovial herniation cysts in the right femoral neck, mild-to-moderate bilateral hip DJD. IMPRESSION: 1. Stranding along the cecum and proximal ascending colon and slight wall thickening concerning for colitis. 2. Prominent appendix, but contains contrast throughout its  length with no inflammatory changes. Correlate clinically to exclude early appendicitis. 3. Contrast enhancement persisting in the bilateral renal cortex despite excretion into the bladder. This is worrisome for contrast nephrotoxicity. Clinical correlation advised. 4. Hyperdense material in the proximal gallbladder which could be tiny stones or layering vicariously excreted contrast. No wall thickening. 5. Bilateral small renal cortical hypodensities which are too small to characterize. 6. Sigmoid diverticula. No evidence of diverticulitis, but a short-segment of the mid sigmoid colon does appear to show circumferential wall thickening. Follow-up colonoscopy recommended to exclude underlying lesion. 7. Prostatomegaly with mild bladder thickening which is probably due to hypertrophy. No inflammatory change. 8. Aortic atherosclerosis and additional findings described above. Electronically Signed   By: Almira Bar M.D.   On: 06/12/2022 20:22    Scheduled Meds:  aspirin EC  81 mg Oral Daily   atorvastatin  40 mg Oral Daily   brimonidine  1 drop Both Eyes BID   carvedilol  12.5 mg Oral BID WC   fluticasone  1 spray Each Nare Daily  gabapentin  300 mg Oral QHS   loratadine  10 mg Oral Daily   rivaroxaban  10 mg Oral Daily   Continuous Infusions:  sodium chloride Stopped (06/13/22 2228)   cefTRIAXone (ROCEPHIN)  IV Stopped (06/13/22 2258)   metronidazole 500 mg (06/14/22 0950)     LOS: 2 days   Burnadette Pop, MD [facility] P8/10/2022, 1:37 PM

## 2022-06-14 NOTE — Plan of Care (Signed)
  Problem: Safety: Goal: Ability to remain free from injury will improve Outcome: Progressing   Problem: Pain Managment: Goal: General experience of comfort will improve Outcome: Progressing   

## 2022-06-15 DIAGNOSIS — K529 Noninfective gastroenteritis and colitis, unspecified: Secondary | ICD-10-CM | POA: Diagnosis not present

## 2022-06-15 MED ORDER — LOPERAMIDE HCL 2 MG PO TABS: 2.0000 mg | ORAL_TABLET | Freq: Four times a day (QID) | ORAL | 0 refills | Status: DC | PRN

## 2022-06-15 NOTE — Progress Notes (Signed)
Patient discharged to home, discharge instructions reviewed with patient who verbalized understanding.  

## 2022-06-15 NOTE — Discharge Summary (Signed)
Physician Discharge Summary  Brandon Frye [id number] DOB: 04-12-1946 DOA: 06/12/2022  PCP: [facility]  Admit date: 06/12/2022 Discharge date: 06/15/2022  Admitted From: Home Disposition:  Home  Discharge Condition:Stable CODE STATUS:FULL Diet recommendation: Heart Healthy   Brief/Interim Summary:  Patient is a 76 year old male with history of diastolic congestive heart failure, nonsustained V. tach status post ICD, hypertension, COPD who presented from home with complaint of abdomen pain, diarrhea that started about 4 days ago after he ate chicken, squash followed by cramping abdominal pain, decreased appetite.  He also recently took clindamycin after debridement of ingrowing toenail.  He did CT abdomen/pelvis at [facility] which showed possible colitis and he was sent to the emergency department.  On presentation he was afebrile, normotensive.  Lab work showed acute kidney injury, hyperkalemia.  Repeat CT abdomen/pelvis showed colitis with stranding along the cecum, proximal ascending colon.  There was also suspicion for already appendicitis, general surgery consulted.  General surgery ruled out appendicitis and has signed off.  Abdominal pain has resolved.  GI pathogen panel/C. difficile negative.  Medically stable for discharge home today.  Following problems were addressed during his hospitalization:  Colitis: Presented with abdomen pain, diarrhea.  CT abdomen/pelvis as above C. difficile antigen positive but PCR negative.  GI pathogen panel negative.  Had an episode of loose bowel movement this morning. There was also concern for early appendicitis which does not correlate with exam.  General surgery signed off ruling out appendicitis. Abd resolved.  Continue Imodium for diarrhea.   Hyperkalemia: Currently potassium stable.   History of DVT: On Xarelto at home.  resume   AKI on CKD stage IIIa: Baseline creatinine around 1.5-1.8.  Presented with creatinine in the  range of 2.  Currently kidney function at baseline   Chronic diastolic CHF: Last echo showed EF of 60 to [id number], grade 1 diastolic dysfunction.  Currently appears euvolemic.  Home medication list also shows Entresto.  He follows with CHF team.   History of NSVT: Status post ICD   COPD: Currently stable without exacerbation.      Discharge Diagnoses:  Principal Problem:   Colitis Active Problems:   COPD (chronic obstructive pulmonary disease) ([place])   ICD (implantable cardioverter-defibrillator) in place   AKI (acute kidney injury) ([place])   Chronic heart failure (HCC)   History of DVT (deep vein thrombosis)   Hyperkalemia   Open toe wound, subsequent encounter    Discharge Instructions  Discharge Instructions     Diet - low sodium heart healthy   Complete by: As directed    Discharge instructions   Complete by: As directed    1)Please take prescribed medication as instructed 2)Follow up with your PCP in a week   Increase activity slowly   Complete by: As directed    No wound care   Complete by: As directed       Allergies as of 06/15/2022       Reactions   Lisinopril Cough        Medication List     STOP taking these medications    clindamycin 150 MG capsule Commonly known as: CLEOCIN       TAKE these medications    aspirin EC 81 MG tablet Take 81 mg by mouth daily.   atorvastatin 40 MG tablet Commonly known as: LIPITOR Take 40 mg by mouth daily.   brimonidine 0.1 % Soln Commonly known as: ALPHAGAN P Place 1 drop into both eyes in the morning and at  bedtime.   carvedilol 25 MG tablet Commonly known as: COREG Take 12.5 mg by mouth 2 (two) times daily with a meal.   Entresto 49-51 MG Generic drug: sacubitril-valsartan Take 1 tablet by mouth 2 (two) times daily.   fluticasone 27.5 MCG/SPRAY nasal spray Commonly known as: VERAMYST Place 1 spray into the nose daily.   furosemide 20 MG tablet Commonly known as: LASIX Take 20 mg by mouth.    gabapentin 300 MG capsule Commonly known as: NEURONTIN Take 300 mg by mouth at bedtime.   ipratropium 0.03 % nasal spray Commonly known as: ATROVENT Place 2 sprays into both nostrils 3 (three) times daily.   Ipratropium-Albuterol 20-100 MCG/ACT Aers respimat Commonly known as: Combivent Respimat Inhale 1 puff into the lungs every 6 (six) hours as needed for wheezing or shortness of breath (or coughing).   ketotifen 0.025 % ophthalmic solution Commonly known as: ZADITOR 1 drop 2 (two) times daily as needed (allergies).   loperamide 2 MG tablet Commonly known as: Imodium A-D Take 1 tablet (2 mg total) by mouth 4 (four) times daily as needed for diarrhea or loose stools.   loratadine 10 MG tablet Commonly known as: CLARITIN Take 10 mg by mouth daily.   rivaroxaban 10 MG Tabs tablet Commonly known as: XARELTO Take 1 tablet (10 mg total) by mouth daily. Start 12/29/19   sildenafil 100 MG tablet Commonly known as: VIAGRA Take 100 mg by mouth as needed.        Follow-up Information     Clinic, [place]. Schedule an appointment as soon as possible for a visit in 1 week(s).   Contact information: [place] [address] [phone]                Allergies  Allergen Reactions   Lisinopril Cough    Consultations: None   Procedures/Studies: CT ABDOMEN PELVIS WO CONTRAST  Result Date: 06/12/2022 CLINICAL DATA:  The patient states he went to the [place] yesterday and was told to come to the ED due to an abnormal CT scan of the abdomen. Complains of left-greater-than-right upper abdominal pain and diarrhea. EXAM: CT ABDOMEN AND PELVIS WITHOUT CONTRAST TECHNIQUE: Multidetector CT imaging of the abdomen and pelvis was performed following the standard protocol without IV contrast. RADIATION DOSE REDUCTION: This exam was performed according to the departmental dose-optimization program which includes automated exposure control,  adjustment of the mA and/or kV according to patient size and/or use of iterative reconstruction technique. COMPARISON:  Last CT on this patient in our system was high-resolution chest CT 12/24/2019 FINDINGS: Lower chest: The cardiac size is normal. There is artifact from a single cardiac assist device lead in the right ventricle, not present previously with interval decreased size of the heart. There is left lower lobe posterior basal chronic pleural-parenchymal scarring and adhesions. There is linear calcification along the left lateral basal pleural surface. Lung bases are otherwise clear. Hepatobiliary: There is hyperdense layering material in the proximal gallbladder which could be tiny layering stones or vicariously excreted contrast. There is no wall thickening or biliary dilatation. The unenhanced liver is unremarkable. Pancreas: No focal abnormality is seen without contrast no adjacent inflammatory change. Spleen: Unremarkable without contrast. Adrenals/Urinary Tract: There is no adrenal mass. There is symmetric contrast enhancement of the renal cortex. We do not know details of the patient's recent outside imaging but presumably contrast was administered. Retained cortical contrast at this point is worrisome for contrast nephrotoxicity. Clinical correlation is advised. There are  a few subcentimeter hypodensities in the bilateral renal cortex which are too small to characterize but probably cysts. There is no urinary stone or obstruction. There is contrast in the bladder and mild thickening of the bladder without inflammatory changes, possibly related to prostatomegaly. Stomach/Bowel: Unremarkable gastric wall. Unremarkable unopacified small bowel. There is contrast in the ascending, transverse and descending colon. Contrast also extends into the appendix which is prominent in size measuring 10.3 mm, but the contrast extends all the way to the tip of the appendix there is no inflammatory change. Stranding is  seen however, alongside the cecum and proximal ascending colon with slight wall thickening concerning for colitis. Advanced diverticulosis in the distal descending and sigmoid segment is seen but there are no inflammatory changes associated with it. There is a short segment of the mid sigmoid colon however, which appears to show circumferential wall thickening on series 2 axial images 41-44, warranting colonoscopy follow-up after treatment to exclude underlying lesion. No abnormality seen of the rectum. Vascular/Lymphatic: There is moderate aortoiliac calcific plaque without AAA. No adenopathy is seen. Reproductive: Enlarged prostate, measures 4.8 cm transverse. Other: There is no free air, free hemorrhage or free fluid. Small umbilical and left inguinal fat hernias are noted with evidence of prior right inguinal hernia repair. Musculoskeletal: There is osteopenia and degenerative change of the lumbar spine. There is partial ankylosis across the collapsed discs of L1-2 and L5-S1, moderate marginal osteophytosis throughout. There are small synovial herniation cysts in the right femoral neck, mild-to-moderate bilateral hip DJD. IMPRESSION: 1. Stranding along the cecum and proximal ascending colon and slight wall thickening concerning for colitis. 2. Prominent appendix, but contains contrast throughout its length with no inflammatory changes. Correlate clinically to exclude early appendicitis. 3. Contrast enhancement persisting in the bilateral renal cortex despite excretion into the bladder. This is worrisome for contrast nephrotoxicity. Clinical correlation advised. 4. Hyperdense material in the proximal gallbladder which could be tiny stones or layering vicariously excreted contrast. No wall thickening. 5. Bilateral small renal cortical hypodensities which are too small to characterize. 6. Sigmoid diverticula. No evidence of diverticulitis, but a short-segment of the mid sigmoid colon does appear to show  circumferential wall thickening. Follow-up colonoscopy recommended to exclude underlying lesion. 7. Prostatomegaly with mild bladder thickening which is probably due to hypertrophy. No inflammatory change. 8. Aortic atherosclerosis and additional findings described above. Electronically Signed   By: Almira Bar M.D.   On: 06/12/2022 20:22      Subjective:  Patient seen and examined at the bedside this morning.  Hemodynamically stable for discharge today.  Discharge Exam: Vitals:   06/14/22 2110 06/15/22 0550  BP: 130/80 136/89  Pulse: 63 64  Resp: 18 16  Temp: 98.2 F (36.8 C) 98.2 F (36.8 C)  SpO2: 96% 96%   Vitals:   06/14/22 0511 06/14/22 1417 06/14/22 2110 06/15/22 0550  BP: (!) 153/91 119/69 130/80 136/89  Pulse: 66 61 63 64  Resp:  18 18 16   Temp: 97.8 F (36.6 C) 98.3 F (36.8 C) 98.2 F (36.8 C) 98.2 F (36.8 C)  TempSrc: Oral Oral Oral Oral  SpO2: 94% 99% 96% 96%  Weight:      Height:        General: Pt is alert, awake, not in acute distress Cardiovascular: RRR, S1/S2 +, no rubs, no gallops Respiratory: CTA bilaterally, no wheezing, no rhonchi Abdominal: Soft, NT, ND, bowel sounds + Extremities: no edema, no cyanosis    The results of significant diagnostics from  this hospitalization (including imaging, microbiology, ancillary and laboratory) are listed below for reference.     Microbiology: Recent Results (from the past 240 hour(s))  Gastrointestinal Panel by PCR , Stool     Status: None   Collection Time: 06/13/22 10:54 AM   Specimen: Stool  Result Value Ref Range Status   Campylobacter species NOT DETECTED NOT DETECTED Final   Plesimonas shigelloides NOT DETECTED NOT DETECTED Final   Salmonella species NOT DETECTED NOT DETECTED Final   Yersinia enterocolitica NOT DETECTED NOT DETECTED Final   Vibrio species NOT DETECTED NOT DETECTED Final   Vibrio cholerae NOT DETECTED NOT DETECTED Final   Enteroaggregative E coli (EAEC) NOT DETECTED NOT  DETECTED Final   Enteropathogenic E coli (EPEC) NOT DETECTED NOT DETECTED Final   Enterotoxigenic E coli (ETEC) NOT DETECTED NOT DETECTED Final   Shiga like toxin producing E coli (STEC) NOT DETECTED NOT DETECTED Final   Shigella/Enteroinvasive E coli (EIEC) NOT DETECTED NOT DETECTED Final   Cryptosporidium NOT DETECTED NOT DETECTED Final   Cyclospora cayetanensis NOT DETECTED NOT DETECTED Final   Entamoeba histolytica NOT DETECTED NOT DETECTED Final   Giardia lamblia NOT DETECTED NOT DETECTED Final   Adenovirus F40/41 NOT DETECTED NOT DETECTED Final   Astrovirus NOT DETECTED NOT DETECTED Final   Norovirus GI/GII NOT DETECTED NOT DETECTED Final   Rotavirus A NOT DETECTED NOT DETECTED Final   Sapovirus (I, II, IV, and V) NOT DETECTED NOT DETECTED Final    Comment: Performed at [facility], [address] [place], [place] [address]  C Difficile Quick Screen w PCR reflex     Status: Abnormal   Collection Time: 06/13/22 10:54 AM   Specimen: Stool  Result Value Ref Range Status   C Diff antigen POSITIVE (A) NEGATIVE Final   C Diff toxin NEGATIVE NEGATIVE Final   C Diff interpretation Results are indeterminate. See PCR results.  Final    Comment: Performed at [facility], [place] [address], [place], [place] [address]  C. Diff by PCR, Reflexed     Status: None   Collection Time: 06/13/22 10:54 AM  Result Value Ref Range Status   Toxigenic C. Difficile by PCR NEGATIVE NEGATIVE Final    Comment: Patient is colonized with non toxigenic C. difficile. May not need treatment unless significant symptoms are present. Performed at [facility], [place] [address], [place], [place] [address]      Labs: BNP (last 3 results) No results for input(s): "BNP" in the last 8760 hours. Basic Metabolic Panel: Recent Labs  Lab 06/11/22 1609 06/12/22 0520 06/12/22 1731 06/13/22 0616 06/14/22 0549  NA 136 135 137 140 139  K 4.9 4.4 5.2* 4.3 4.2  CL 102 103 104 112* 108   CO2 24 23 25 24 24   GLUCOSE 80 117* 82 79 80  BUN 33* 40* 46* 33* 16  CREATININE 1.93* 2.20* 2.32* 1.71* 1.23  CALCIUM 9.4 9.2 9.6 8.5* 8.6*   Liver Function Tests: Recent Labs  Lab 06/11/22 1609 06/12/22 0520 06/12/22 1731  AST 21 18 22   ALT 11 11 10   ALKPHOS 67 71 66  BILITOT 0.7 0.3 0.8  PROT 7.9 7.3 8.5*  ALBUMIN 4.1 3.8 4.5   Recent Labs  Lab 06/11/22 1609 06/12/22 0520  LIPASE 32 37   No results for input(s): "AMMONIA" in the last 168 hours. CBC: Recent Labs  Lab 06/11/22 1609 06/12/22 0520 06/12/22 1628 06/14/22 0549  WBC 6.3 5.4 6.3 4.6  NEUTROABS  3.7 3.2 3.4  --   HGB 14.2 13.3 14.3 12.6*  HCT 42.2 38.8* 43.5 37.9*  MCV 87.7 86.6 89.9 88.6  PLT 192 179 166 172   Cardiac Enzymes: No results for input(s): "CKTOTAL", "CKMB", "CKMBINDEX", "TROPONINI" in the last 168 hours. BNP: Invalid input(s): "POCBNP" CBG: No results for input(s): "GLUCAP" in the last 168 hours. D-Dimer No results for input(s): "DDIMER" in the last 72 hours. Hgb A1c No results for input(s): "HGBA1C" in the last 72 hours. Lipid Profile No results for input(s): "CHOL", "HDL", "LDLCALC", "TRIG", "CHOLHDL", "LDLDIRECT" in the last 72 hours. Thyroid function studies No results for input(s): "TSH", "T4TOTAL", "T3FREE", "THYROIDAB" in the last 72 hours.  Invalid input(s): "FREET3" Anemia work up No results for input(s): "VITAMINB12", "FOLATE", "FERRITIN", "TIBC", "IRON", "RETICCTPCT" in the last 72 hours. Urinalysis    Component Value Date/Time   COLORURINE YELLOW 06/12/2022 0517   APPEARANCEUR HAZY (A) 06/12/2022 0517   LABSPEC 1.028 06/12/2022 0517   PHURINE 5.0 06/12/2022 0517   GLUCOSEU NEGATIVE 06/12/2022 0517   HGBUR NEGATIVE 06/12/2022 0517   BILIRUBINUR NEGATIVE 06/12/2022 0517   KETONESUR NEGATIVE 06/12/2022 0517   PROTEINUR NEGATIVE 06/12/2022 0517   NITRITE NEGATIVE 06/12/2022 0517   LEUKOCYTESUR NEGATIVE 06/12/2022 0517   Sepsis Labs Recent Labs  Lab  06/11/22 1609 06/12/22 0520 06/12/22 1628 06/14/22 0549  WBC 6.3 5.4 6.3 4.6   Microbiology Recent Results (from the past 240 hour(s))  Gastrointestinal Panel by PCR , Stool     Status: None   Collection Time: 06/13/22 10:54 AM   Specimen: Stool  Result Value Ref Range Status   Campylobacter species NOT DETECTED NOT DETECTED Final   Plesimonas shigelloides NOT DETECTED NOT DETECTED Final   Salmonella species NOT DETECTED NOT DETECTED Final   Yersinia enterocolitica NOT DETECTED NOT DETECTED Final   Vibrio species NOT DETECTED NOT DETECTED Final   Vibrio cholerae NOT DETECTED NOT DETECTED Final   Enteroaggregative E coli (EAEC) NOT DETECTED NOT DETECTED Final   Enteropathogenic E coli (EPEC) NOT DETECTED NOT DETECTED Final   Enterotoxigenic E coli (ETEC) NOT DETECTED NOT DETECTED Final   Shiga like toxin producing E coli (STEC) NOT DETECTED NOT DETECTED Final   Shigella/Enteroinvasive E coli (EIEC) NOT DETECTED NOT DETECTED Final   Cryptosporidium NOT DETECTED NOT DETECTED Final   Cyclospora cayetanensis NOT DETECTED NOT DETECTED Final   Entamoeba histolytica NOT DETECTED NOT DETECTED Final   Giardia lamblia NOT DETECTED NOT DETECTED Final   Adenovirus F40/41 NOT DETECTED NOT DETECTED Final   Astrovirus NOT DETECTED NOT DETECTED Final   Norovirus GI/GII NOT DETECTED NOT DETECTED Final   Rotavirus A NOT DETECTED NOT DETECTED Final   Sapovirus (I, II, IV, and V) NOT DETECTED NOT DETECTED Final    Comment: Performed at [facility], [address] [place], [place] [address]  C Difficile Quick Screen w PCR reflex     Status: Abnormal   Collection Time: 06/13/22 10:54 AM   Specimen: Stool  Result Value Ref Range Status   C Diff antigen POSITIVE (A) NEGATIVE Final   C Diff toxin NEGATIVE NEGATIVE Final   C Diff interpretation Results are indeterminate. See PCR results.  Final    Comment: Performed at [facility], [address] [place], [place]  [address]  C. Diff by PCR, Reflexed     Status: None   Collection Time: 06/13/22 10:54 AM  Result Value Ref Range Status   Toxigenic C. Difficile by PCR NEGATIVE  NEGATIVE Final    Comment: Patient is colonized with non toxigenic C. difficile. May not need treatment unless significant symptoms are present. Performed at [facility], [place] [address] [place], [place] [address]     Please note: You were cared for by a hospitalist during your hospital stay. Once you are discharged, your primary care physician will handle any further medical issues. Please note that NO REFILLS for any discharge medications will be authorized once you are discharged, as it is imperative that you return to your primary care physician (or establish a relationship with a primary care physician if you do not have one) for your post hospital discharge needs so that they can reassess your need for medications and monitor your lab values.    Time coordinating discharge: 40 minutes  SIGNED:   Shelly Coss, MD  [facility] 06/15/2022, 10:37 AM Pager [phone]  If 7PM-7AM, please contact night-coverage [email or web address] Password [email or web address]

## 2022-06-15 NOTE — Progress Notes (Signed)
  Transition of Care (TOC) Screening Note   Patient Details  Name: Brandon Frye Date of Birth: [birth year]   Transition of Care [facility]) CM/SW Contact:    Amada Jupiter, LCSW Phone Number: 06/15/2022, 11:48 AM    Transition of Care Department [facility]) has reviewed patient and no TOC needs have been identified at this time. We will continue to monitor patient advancement through interdisciplinary progression rounds. If new patient transition needs arise, please place a TOC consult.

## 2022-10-25 ENCOUNTER — Other Ambulatory Visit: Payer: Self-pay

## 2022-10-25 ENCOUNTER — Emergency Department (HOSPITAL_COMMUNITY): Payer: Non-veteran care

## 2022-10-25 ENCOUNTER — Encounter (HOSPITAL_COMMUNITY): Payer: Self-pay

## 2022-10-25 ENCOUNTER — Emergency Department (HOSPITAL_COMMUNITY)
Admission: EM | Admit: 2022-10-25 | Discharge: 2022-10-26 | Discharge status: Against Medical Advice | Payer: Non-veteran care | Attending: Student | Admitting: Student

## 2022-10-25 DIAGNOSIS — M542 Cervicalgia: Secondary | ICD-10-CM | POA: Diagnosis not present

## 2022-10-25 DIAGNOSIS — M545 Low back pain, unspecified: Secondary | ICD-10-CM | POA: Insufficient documentation

## 2022-10-25 DIAGNOSIS — Y92410 Unspecified street and highway as the place of occurrence of the external cause: Secondary | ICD-10-CM | POA: Insufficient documentation

## 2022-10-25 DIAGNOSIS — Z5321 Procedure and treatment not carried out due to patient leaving prior to being seen by health care provider: Secondary | ICD-10-CM | POA: Insufficient documentation
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
## 2022-10-26 NOTE — ED Notes (Signed)
Called x3 for room placement. Eloped from waiting area.

## 2023-10-18 ENCOUNTER — Encounter (HOSPITAL_COMMUNITY): Payer: Self-pay

## 2023-10-18 ENCOUNTER — Observation Stay (HOSPITAL_COMMUNITY)
Admission: EM | Admit: 2023-10-18 | Discharge: 2023-10-18 | Disposition: A | Discharge status: Home / Self Care | Payer: No Typology Code available for payment source | Attending: Internal Medicine | Admitting: Internal Medicine

## 2023-10-18 ENCOUNTER — Emergency Department (HOSPITAL_COMMUNITY): Payer: No Typology Code available for payment source

## 2023-10-18 ENCOUNTER — Other Ambulatory Visit (HOSPITAL_COMMUNITY): Payer: Non-veteran care

## 2023-10-18 ENCOUNTER — Other Ambulatory Visit: Payer: Self-pay

## 2023-10-18 DIAGNOSIS — E861 Hypovolemia: Secondary | ICD-10-CM

## 2023-10-18 DIAGNOSIS — Z79899 Other long term (current) drug therapy: Secondary | ICD-10-CM | POA: Insufficient documentation

## 2023-10-18 DIAGNOSIS — Z7982 Long term (current) use of aspirin: Secondary | ICD-10-CM | POA: Insufficient documentation

## 2023-10-18 DIAGNOSIS — Z86718 Personal history of other venous thrombosis and embolism: Secondary | ICD-10-CM | POA: Diagnosis not present

## 2023-10-18 DIAGNOSIS — I5030 Unspecified diastolic (congestive) heart failure: Secondary | ICD-10-CM | POA: Insufficient documentation

## 2023-10-18 DIAGNOSIS — R55 Syncope and collapse: Secondary | ICD-10-CM | POA: Diagnosis not present

## 2023-10-18 DIAGNOSIS — Z1152 Encounter for screening for COVID-19: Secondary | ICD-10-CM | POA: Insufficient documentation

## 2023-10-18 DIAGNOSIS — J449 Chronic obstructive pulmonary disease, unspecified: Secondary | ICD-10-CM | POA: Diagnosis present

## 2023-10-18 DIAGNOSIS — Z95810 Presence of automatic (implantable) cardiac defibrillator: Secondary | ICD-10-CM | POA: Diagnosis not present

## 2023-10-18 DIAGNOSIS — I5022 Chronic systolic (congestive) heart failure: Secondary | ICD-10-CM | POA: Insufficient documentation

## 2023-10-18 DIAGNOSIS — I429 Cardiomyopathy, unspecified: Secondary | ICD-10-CM

## 2023-10-18 DIAGNOSIS — Z7901 Long term (current) use of anticoagulants: Secondary | ICD-10-CM | POA: Insufficient documentation

## 2023-10-18 DIAGNOSIS — I2510 Atherosclerotic heart disease of native coronary artery without angina pectoris: Secondary | ICD-10-CM | POA: Insufficient documentation

## 2023-10-18 DIAGNOSIS — G250 Essential tremor: Secondary | ICD-10-CM | POA: Insufficient documentation

## 2023-10-18 DIAGNOSIS — I428 Other cardiomyopathies: Secondary | ICD-10-CM

## 2023-10-18 DIAGNOSIS — I959 Hypotension, unspecified: Secondary | ICD-10-CM | POA: Diagnosis not present

## 2023-10-18 DIAGNOSIS — E785 Hyperlipidemia, unspecified: Secondary | ICD-10-CM

## 2023-10-18 LAB — COMPREHENSIVE METABOLIC PANEL
ALT: 50 U/L — ABNORMAL HIGH (ref 0–44)
AST: 38 U/L (ref 15–41)
Albumin: 3.1 g/dL — ABNORMAL LOW (ref 3.5–5.0)
Alkaline Phosphatase: 61 U/L (ref 38–126)
Anion gap: 7 (ref 5–15)
BUN: 31 mg/dL — ABNORMAL HIGH (ref 8–23)
CO2: 22 mmol/L (ref 22–32)
Calcium: 8.4 mg/dL — ABNORMAL LOW (ref 8.9–10.3)
Chloride: 104 mmol/L (ref 98–111)
Creatinine, Ser: 1.46 mg/dL — ABNORMAL HIGH (ref 0.61–1.24)
GFR, Estimated: 50 mL/min — ABNORMAL LOW (ref >60)
Glucose, Bld: 86 mg/dL (ref 70–99)
Potassium: 4.4 mmol/L (ref 3.5–5.1)
Sodium: 133 mmol/L — ABNORMAL LOW (ref 135–145)
Total Bilirubin: 0.5 mg/dL (ref <1.2)
Total Protein: 6.2 g/dL — ABNORMAL LOW (ref 6.5–8.1)

## 2023-10-18 LAB — CBC WITH DIFFERENTIAL/PLATELET
Abs Immature Granulocytes: 0.01 10*3/uL (ref 0.00–0.07)
Basophils Absolute: 0 10*3/uL (ref 0.0–0.1)
Basophils Relative: 1 %
Eosinophils Absolute: 0.2 10*3/uL (ref 0.0–0.5)
Eosinophils Relative: 4 %
HCT: 30.2 % — ABNORMAL LOW (ref 39.0–52.0)
Hemoglobin: 9.9 g/dL — ABNORMAL LOW (ref 13.0–17.0)
Immature Granulocytes: 0 %
Lymphocytes Relative: 39 %
Lymphs Abs: 2 10*3/uL (ref 0.7–4.0)
MCH: 29.1 pg (ref 26.0–34.0)
MCHC: 32.8 g/dL (ref 30.0–36.0)
MCV: 88.8 fL (ref 80.0–100.0)
Monocytes Absolute: 0.9 10*3/uL (ref 0.1–1.0)
Monocytes Relative: 17 %
Neutro Abs: 2.1 10*3/uL (ref 1.7–7.7)
Neutrophils Relative %: 39 %
Platelets: 217 10*3/uL (ref 150–400)
RBC: 3.4 MIL/uL — ABNORMAL LOW (ref 4.22–5.81)
RDW: 13 % (ref 11.5–15.5)
WBC: 5.2 10*3/uL (ref 4.0–10.5)
nRBC: 0 % (ref 0.0–0.2)

## 2023-10-18 LAB — RESPIRATORY PANEL BY PCR

## 2023-10-18 LAB — URINALYSIS, ROUTINE W REFLEX MICROSCOPIC
Bilirubin Urine: NEGATIVE
Glucose, UA: NEGATIVE mg/dL
Hgb urine dipstick: NEGATIVE
Ketones, ur: NEGATIVE mg/dL
Leukocytes,Ua: NEGATIVE
Nitrite: NEGATIVE
Protein, ur: NEGATIVE mg/dL
Specific Gravity, Urine: 1.013 (ref 1.005–1.030)
pH: 5 (ref 5.0–8.0)

## 2023-10-18 LAB — TROPONIN I (HIGH SENSITIVITY)
Troponin I (High Sensitivity): 8 ng/L (ref <18)
Troponin I (High Sensitivity): 7 ng/L (ref <18)

## 2023-10-18 LAB — CBC
HCT: 28.2 % — ABNORMAL LOW (ref 39.0–52.0)
Hemoglobin: 9.4 g/dL — ABNORMAL LOW (ref 13.0–17.0)
MCH: 29.1 pg (ref 26.0–34.0)
MCHC: 33.3 g/dL (ref 30.0–36.0)
MCV: 87.3 fL (ref 80.0–100.0)
Platelets: 197 10*3/uL (ref 150–400)
RBC: 3.23 MIL/uL — ABNORMAL LOW (ref 4.22–5.81)
RDW: 13 % (ref 11.5–15.5)
WBC: 4.7 10*3/uL (ref 4.0–10.5)
nRBC: 0 % (ref 0.0–0.2)

## 2023-10-18 LAB — RAPID URINE DRUG SCREEN, HOSP PERFORMED
Amphetamines: NOT DETECTED
Barbiturates: NOT DETECTED
Benzodiazepines: NOT DETECTED
Cocaine: NOT DETECTED
Opiates: NOT DETECTED
Tetrahydrocannabinol: NOT DETECTED

## 2023-10-18 LAB — BRAIN NATRIURETIC PEPTIDE: B Natriuretic Peptide: 317.2 pg/mL — ABNORMAL HIGH (ref 0.0–100.0)

## 2023-10-18 LAB — I-STAT CG4 LACTIC ACID, ED: Lactic Acid, Venous: 0.9 mmol/L (ref 0.5–1.9)

## 2023-10-18 LAB — CREATININE, SERUM
Creatinine, Ser: 1.5 mg/dL — ABNORMAL HIGH (ref 0.61–1.24)
GFR, Estimated: 48 mL/min — ABNORMAL LOW (ref >60)

## 2023-10-18 LAB — MAGNESIUM: Magnesium: 2 mg/dL (ref 1.7–2.4)

## 2023-10-18 MED ORDER — BENZONATATE 100 MG PO CAPS
100.0000 mg | ORAL_CAPSULE | Freq: Three times a day (TID) | ORAL | Status: DC | PRN
  Administered 2023-10-18: 100 mg via ORAL
  Filled 2023-10-18: qty 1

## 2023-10-18 MED ORDER — CARVEDILOL 12.5 MG PO TABS: 12.5000 mg | ORAL_TABLET | Freq: Two times a day (BID) | ORAL | Status: DC

## 2023-10-18 MED ORDER — RIVAROXABAN 10 MG PO TABS: 10.0000 mg | ORAL_TABLET | Freq: Every day | ORAL | Status: DC

## 2023-10-18 MED ORDER — PRIMIDONE 50 MG PO TABS: 25.0000 mg | ORAL_TABLET | Freq: Every day | ORAL | Status: DC

## 2023-10-18 MED ORDER — GUAIFENESIN ER 600 MG PO TB12: 600.0000 mg | ORAL_TABLET | Freq: Two times a day (BID) | ORAL | 0 refills | Status: DC

## 2023-10-18 MED ORDER — GUAIFENESIN ER 600 MG PO TB12
600.0000 mg | ORAL_TABLET | Freq: Two times a day (BID) | ORAL | Status: DC
Start: 2023-10-18 — End: 2023-10-18
  Administered 2023-10-18: 600 mg via ORAL
  Filled 2023-10-18: qty 1

## 2023-10-18 MED ORDER — ACETAMINOPHEN 650 MG RE SUPP: 650.0000 mg | Freq: Four times a day (QID) | RECTAL | Status: DC | PRN

## 2023-10-18 MED ORDER — ACETAMINOPHEN 325 MG PO TABS: 650.0000 mg | ORAL_TABLET | Freq: Four times a day (QID) | ORAL | Status: DC | PRN

## 2023-10-18 MED ORDER — BENZONATATE 100 MG PO CAPS: 100.0000 mg | ORAL_CAPSULE | Freq: Three times a day (TID) | ORAL | 0 refills | Status: AC | PRN

## 2023-10-18 MED ORDER — HEPARIN SODIUM (PORCINE) 5000 UNIT/ML IJ SOLN
5000.0000 [IU] | Freq: Three times a day (TID) | INTRAMUSCULAR | Status: DC
  Administered 2023-10-18: 5000 [IU] via SUBCUTANEOUS
  Filled 2023-10-18: qty 1

## 2023-10-18 MED ORDER — SODIUM CHLORIDE 0.9 % IV SOLN: INTRAVENOUS | Status: DC

## 2023-10-18 MED ORDER — NICOTINE 14 MG/24HR TD PT24
14.0000 mg | MEDICATED_PATCH | Freq: Every day | TRANSDERMAL | Status: DC
  Administered 2023-10-18: 14 mg via TRANSDERMAL
  Filled 2023-10-18: qty 1

## 2023-10-18 MED ORDER — GABAPENTIN 300 MG PO CAPS: 300.0000 mg | ORAL_CAPSULE | Freq: Every day | ORAL | Status: DC

## 2023-10-18 MED ORDER — ATORVASTATIN CALCIUM 40 MG PO TABS
40.0000 mg | ORAL_TABLET | Freq: Every day | ORAL | Status: DC
Start: 2023-10-18 — End: 2023-10-18
  Administered 2023-10-18: 40 mg via ORAL
  Filled 2023-10-18: qty 1

## 2023-10-18 MED ORDER — GUAIFENESIN ER 600 MG PO TB12: 600.0000 mg | ORAL_TABLET | Freq: Two times a day (BID) | ORAL | Status: DC

## 2023-10-18 MED ORDER — FLUTICASONE PROPIONATE 50 MCG/ACT NA SUSP: 1.0000 | Freq: Every day | NASAL | Status: DC

## 2023-10-18 MED ORDER — ASPIRIN 81 MG PO TBEC: 81.0000 mg | DELAYED_RELEASE_TABLET | Freq: Every day | ORAL | Status: DC

## 2023-10-18 MED ORDER — SENNOSIDES-DOCUSATE SODIUM 8.6-50 MG PO TABS: 1.0000 | ORAL_TABLET | Freq: Every evening | ORAL | Status: DC | PRN

## 2023-10-18 NOTE — ED Provider Notes (Signed)
EMERGENCY DEPARTMENT AT [facility] Provider Note   CSN: [id number] Arrival date & time: 10/18/23  0100     History  Chief Complaint  Patient presents with   Hypotension    Brandon Frye is a 78 y.o. male.  HPI 78 year old male history of nonischemic cardiomyopathy status post AICD, systolic heart failure, prior DVT, COPD presenting for cough and syncope.  Patient is here with his wife.  He has had a cough for several weeks.  About a week ago he coughed so much that he passed out.  Again today he coughed, was reaching over to grab some water and he passed out briefly.  He fell out of the bed.  Wife saw the whole thing, he never hit his head.  He was awake about a second after passing out.  He never felt dizzy or palpitations.  She checked his blood pressure at home and it was 88 systolic.  He feels okay right now.  No chest pain or shortness of breath throughout this and none currently.  No fevers or chills.  No headache or neck pain.  Occasionally has some post tussive emesis.  No recent vomiting or diarrhea.  Is been eating and drinking normally.  No change to his medications.     Home Medications Prior to Admission medications   Medication Sig Start Date End Date Taking? Authorizing Provider  aspirin EC 81 MG tablet Take 81 mg by mouth daily.   Yes [provider]  atorvastatin (LIPITOR) 40 MG tablet Take 40 mg by mouth daily.   Yes [provider]  carvedilol (COREG) 25 MG tablet Take 12.5 mg by mouth 2 (two) times daily with a meal.   Yes [provider]  fluticasone (FLONASE) 50 MCG/ACT nasal spray Place 1 spray into the nose daily. 07/17/23  Yes [provider]  furosemide (LASIX) 20 MG tablet Take 20 mg by mouth.   Yes [provider]  primidone (MYSOLINE) 50 MG tablet Take 25 mg by mouth at bedtime. 09/17/23  Yes [provider]  rivaroxaban (XARELTO) 10 MG TABS tablet Take 1 tablet (10 mg total)  by mouth daily. Start 12/29/19 03/19/20  Yes Laurey Morale, MD  sacubitril-valsartan (ENTRESTO) 49-51 MG Take 1 tablet by mouth 2 (two) times daily. 03/19/20  Yes Laurey Morale, MD  sildenafil (VIAGRA) 100 MG tablet Take 100 mg by mouth as needed. 02/19/21  Yes [provider]  brimonidine (ALPHAGAN P) 0.1 % SOLN Place 1 drop into both eyes in the morning and at bedtime. 02/07/21   [provider]  fluticasone (VERAMYST) 27.5 MCG/SPRAY nasal spray Place 1 spray into the nose daily.    [provider]  gabapentin (NEURONTIN) 300 MG capsule Take 300 mg by mouth at bedtime.    [provider]  ipratropium (ATROVENT) 0.03 % nasal spray Place 2 sprays into both nostrils 3 (three) times daily.    [provider]  Ipratropium-Albuterol (COMBIVENT RESPIMAT) 20-100 MCG/ACT AERS respimat Inhale 1 puff into the lungs every 6 (six) hours as needed for wheezing or shortness of breath (or coughing). Patient not taking: Reported on 06/12/2022 04/10/17   [name], MD  ketotifen (ZADITOR) 0.025 % ophthalmic solution 1 drop 2 (two) times daily as needed (allergies).    [provider]  loperamide (IMODIUM A-D) 2 MG tablet Take 1 tablet (2 mg total) by mouth 4 (four) times daily as needed for diarrhea or loose stools. 06/15/22   Adhikari,  Amrit, MD  loratadine (CLARITIN) 10 MG tablet Take 10 mg by mouth daily.    [provider]      Allergies    Lisinopril    Review of Systems   Review of Systems Review of systems completed and notable as per HPI.  ROS otherwise negative.   Physical Exam Updated Vital Signs BP 119/79 (BP Location: Right Arm)   Pulse 60   Temp 98.2 F (36.8 C) (Oral)   Resp 11   Ht 6\' 1"  (1.854 m)   Wt 79.4 kg   SpO2 100%   BMI 23.09 kg/m  Physical Exam Vitals and nursing note reviewed.  Constitutional:      General: He is not in acute distress.    Appearance: He is well-developed.  HENT:     Head: Normocephalic and  atraumatic.     Nose: Nose normal.     Mouth/Throat:     Mouth: Mucous membranes are moist.     Pharynx: Oropharynx is clear.  Eyes:     Extraocular Movements: Extraocular movements intact.     Conjunctiva/sclera: Conjunctivae normal.     Pupils: Pupils are equal, round, and reactive to light.  Cardiovascular:     Rate and Rhythm: Normal rate and regular rhythm.     Pulses: Normal pulses.     Heart sounds: Normal heart sounds. No murmur heard. Pulmonary:     Effort: Pulmonary effort is normal. No respiratory distress.     Breath sounds: Normal breath sounds.  Abdominal:     Palpations: Abdomen is soft.     Tenderness: There is no abdominal tenderness. There is no guarding or rebound.  Musculoskeletal:        General: No swelling.     Cervical back: Normal range of motion and neck supple. No rigidity or tenderness.     Right lower leg: No edema.     Left lower leg: No edema.  Skin:    General: Skin is warm and dry.     Capillary Refill: Capillary refill takes less than 2 seconds.  Neurological:     General: No focal deficit present.     Mental Status: He is alert and oriented to person, place, and time. Mental status is at baseline.  Psychiatric:        Mood and Affect: Mood normal.     ED Results / Procedures / Treatments   Labs (all labs ordered are listed, but only abnormal results are displayed) Labs Reviewed  COMPREHENSIVE METABOLIC PANEL - Abnormal; Notable for the following components:      Result Value   Sodium 133 (*)    BUN 31 (*)    Creatinine, Ser 1.46 (*)    Calcium 8.4 (*)    Total Protein 6.2 (*)    Albumin 3.1 (*)    ALT 50 (*)    GFR, Estimated 50 (*)    All other components within normal limits  BRAIN NATRIURETIC PEPTIDE - Abnormal; Notable for the following components:   B Natriuretic Peptide 317.2 (*)    All other components within normal limits  CBC WITH DIFFERENTIAL/PLATELET - Abnormal; Notable for the following components:   RBC 3.40 (*)     Hemoglobin 9.9 (*)    HCT 30.2 (*)    All other components within normal limits  CBC - Abnormal; Notable for the following components:   RBC 3.23 (*)    Hemoglobin 9.4 (*)    HCT 28.2 (*)    All  other components within normal limits  RESPIRATORY PANEL BY PCR  URINALYSIS, ROUTINE W REFLEX MICROSCOPIC  MAGNESIUM  RAPID URINE DRUG SCREEN, HOSP PERFORMED  CREATININE, SERUM  I-STAT CG4 LACTIC ACID, ED  TROPONIN I (HIGH SENSITIVITY)  TROPONIN I (HIGH SENSITIVITY)    EKG EKG Interpretation Date/Time:  Sunday October 18 2023 01:27:37 EST Ventricular Rate:  77 PR Interval:  179 QRS Duration:  107 QT Interval:  370 QTC Calculation: 419 R Axis:   46  Text Interpretation: Sinus rhythm Ventricular trigeminy Anterior infarct, old Confirmed by Fulton Reek [phone]) on 10/18/2023 1:34:15 AM  Radiology DG Chest 2 View Result Date: 10/18/2023 CLINICAL DATA:  Syncope and hypotension EXAM: CHEST - 2 VIEW COMPARISON:  12/27/2019 FINDINGS: Left chest wall ICD. Stable cardiomediastinal silhouette. Aortic atherosclerotic calcification. No focal consolidation, pleural effusion, or pneumothorax. No displaced rib fractures. IMPRESSION: No acute cardiopulmonary disease. Electronically Signed   By: Minerva Fester M.D.   On: 10/18/2023 02:06    Procedures Procedures    Medications Ordered in ED Medications  atorvastatin (LIPITOR) tablet 40 mg (has no administration in time range)  0.9 %  sodium chloride infusion ( Intravenous New Bag/Given 10/18/23 0624)  guaiFENesin (MUCINEX) 12 hr tablet 600 mg (600 mg Oral Given 10/18/23 6045)  benzonatate (TESSALON) capsule 100 mg (100 mg Oral Given 10/18/23 4098)  heparin injection 5,000 Units (5,000 Units Subcutaneous Given 10/18/23 0626)  acetaminophen (TYLENOL) tablet 650 mg (has no administration in time range)    Or  acetaminophen (TYLENOL) suppository 650 mg (has no administration in time range)  senna-docusate (Senokot-S) tablet 1 tablet (has no  administration in time range)  nicotine (NICODERM CQ - dosed in mg/24 hours) patch 14 mg (has no administration in time range)  fluticasone (FLONASE) 50 MCG/ACT nasal spray 1 spray (has no administration in time range)  gabapentin (NEURONTIN) capsule 300 mg (has no administration in time range)    ED Course/ Medical Decision Making/ A&P                                 Medical Decision Making Amount and/or Complexity of Data Reviewed Labs: ordered. Radiology: ordered.  Risk Decision regarding hospitalization.   Medical Decision Making:   Braydin Flamand is a 77 y.o. male who presented to the ED today with cough, syncope.  Initial pressure here was soft although on my evaluation he is normotensive.  He is currently asymptomatic other than cough going on for several weeks.  On exam he appears euvolemic, his lungs are clear bilaterally.  Is not had any chest pain or shortness of breath.  EKG shows sinus rhythm, has frequent PVCs no signs of acute ischemia.  I did bedside ultrasound which shows slightly decreased EF, IVC with appropriate variation does not appear grossly volume overloaded or dry.  Wonder if he may have had a vasovagal syncopal episode in setting of frequent cough.  He is anticoagulant with Xarelto and states he been taking all his medications, low suspicion for PE.  Will obtain lab workup, interrogate his AICD as well evaluate for possible arrhythmia.   Patient placed on continuous vitals and telemetry monitoring while in ED which was reviewed periodically.  Reviewed and confirmed nursing documentation for past medical history, family history, social history.  Reassessment and Plan:   Unfortunately our interrogator is not working so unable to evaluate for arrhythmia.  His lab work is notable for baseline renal function, sodium 133,  mild anemia.  Chest x-ray is unremarkable.  He remains stable, did have a coughing episode here that made him feel dizzy.  I think he needs  observation for likely repeat echo, telemetry.  Patient is agreeable.  Discussed with hospitalist and admitted.   Patient's presentation is most consistent with acute complicated illness / injury requiring diagnostic workup.           Final Clinical Impression(s) / ED Diagnoses Final diagnoses:  Syncope, unspecified syncope type    Rx / DC Orders ED Discharge Orders     None         Laurence Spates, MD 10/18/23 [phone]

## 2023-10-18 NOTE — ED Notes (Signed)
ED TO INPATIENT HANDOFF REPORT  ED Nurse Name and Phone #: Sharyne Peach, RN  S Name/Age/Gender Brandon Frye 77 y.o. male Room/Bed: WA16/WA16  Code Status   Code Status: Full Code  Home/SNF/Other Home Patient oriented to: self, place, time, and situation Is this baseline? Yes   Triage Complete: Triage complete  Chief Complaint Syncope and collapse [R55]  Triage Note Pt reports having an episode of hypotension at home (88 over something). Pt has had a cough x 3 weeks and when he coughed tonight, his wife states that he rolled over, falling out of the bed and passed out. Pt's wife went over to him and immediately woke him up.    Allergies Allergies  Allergen Reactions   Lisinopril Cough    Level of Care/Admitting Diagnosis ED Disposition     ED Disposition  Admit   Condition  --   Comment  Hospital Area: [facility] [[id number]]  Level of Care: Telemetry [5]  Admit to tele based on following criteria: Complex arrhythmia (Bradycardia/Tachycardia)  May place patient in observation at [facility] or [facility] if equivalent level of care is available:: Yes  Covid Evaluation: Confirmed COVID Negative  Diagnosis: Syncope and collapse [780.2.ICD-9-CM]  Admitting Physician: Gery Pray [[id number]]  Attending Physician: Rolly Salter [[id number]]          B Medical/Surgery History Past Medical History:  Diagnosis Date   AICD (automatic cardioverter/defibrillator) present    Arthritis    hands, hip knees ankles,back   Chronic systolic CHF (congestive heart failure) (HCC)    DVT (deep venous thrombosis) (HCC)    Non-ischemic cardiomyopathy (HCC) 2017   clean cath, EF 30%   Past Surgical History:  Procedure Laterality Date   CARDIAC CATHETERIZATION  2017   at [facility] in [place], clean, EF 30%   ICD IMPLANT N/A 12/26/2019   Procedure: ICD IMPLANT;  Surgeon: Duke Salvia, MD;  Location: [facility] INVASIVE CV LAB;  Service: Cardiovascular;  Laterality:  N/A;   INGUINAL HERNIA REPAIR Right 06/11/2020   Procedure: OPEN RIGHT INGUINAL HERNIA REPAIR WITH TAP BLOCK;  Surgeon: Berna Bue, MD;  Location: [facility] ORS;  Service: General;  Laterality: Right;   INSERTION OF MESH Right 06/11/2020   Procedure: INSERTION OF MESH;  Surgeon: Berna Bue, MD;  Location: [facility]S;  Service: General;  Laterality: Right;   KNEE SURGERY     Arthroscopic Left knee   RIGHT/LEFT HEART CATH AND CORONARY ANGIOGRAPHY N/A 12/23/2019   Procedure: RIGHT/LEFT HEART CATH AND CORONARY ANGIOGRAPHY;  Surgeon: Laurey Morale, MD;  [facility] INV[facility] CV LAB;  Service: Cardiovascular;  Laterality: N/A;     A IV Location/Drains/Wounds Patient Lines/Drains/Airways Status     Active Line/Drains/Airways     Name Placement date Placement time Site Days   Peripheral IV 10/18/23 20 G Anterior;Left Forearm 10/18/23  0146  Forearm  less than 1            Intake/Output Last 24 hours No intake or output data in the 24 hours ending 10/18/23 0953  Labs/Imaging Results for orders placed or performed during the hospital encounter of 10/18/23 (from the past 48 hours)  Comprehensive metabolic panel     Status: Abnormal   Collection Time: 10/18/23  1:34 AM  Result Value Ref Range   Sodium 133 (L) 135 - 145 mmol/L   Potassium 4.4 3.5 - 5.1 mmol/L   Chloride 104 98 - 111 mmol/L   CO2 22 22 - 32 mmol/L  Glucose, Bld 86 70 - 99 mg/dL    Comment: Glucose reference range applies only to samples taken after fasting for at least 8 hours.   BUN 31 (H) 8 - 23 mg/dL   Creatinine, Ser 1.61 (H) 0.61 - 1.24 mg/dL   Calcium 8.4 (L) 8.9 - 10.3 mg/dL   Total Protein 6.2 (L) 6.5 - 8.1 g/dL   Albumin 3.1 (L) 3.5 - 5.0 g/dL   AST 38 15 - 41 U/L   ALT 50 (H) 0 - 44 U/L   Alkaline Phosphatase 61 38 - 126 U/L   Total Bilirubin 0.5 <1.2 mg/dL   GFR, Estimated 50 (L) >60 mL/min    Comment: (NOTE) Calculated using the CKD-EPI Creatinine Equation (2021)    Anion gap 7 5 - 15    Comment:  Performed at [facility], [address] [place], [place] [address]  Troponin I (High Sensitivity)     Status: None   Collection Time: 10/18/23  1:34 AM  Result Value Ref Range   Troponin I (High Sensitivity) 8 <18 ng/L    Comment: (NOTE) Elevated high sensitivity troponin I (hsTnI) values and significant  changes across serial measurements may suggest ACS but many other  chronic and acute conditions are known to elevate hsTnI results.  Refer to the "Links" section for chest pain algorithms and additional  guidance. Performed at [facility], [address] [place], [place] [address]   CBC with Differential     Status: Abnormal   Collection Time: 10/18/23  1:34 AM  Result Value Ref Range   WBC 5.2 4.0 - 10.5 K/uL   RBC 3.40 (L) 4.22 - 5.81 MIL/uL   Hemoglobin 9.9 (L) 13.0 - 17.0 g/dL   HCT 11.9 (L) 14.7 - 82.9 %   MCV 88.8 80.0 - 100.0 fL   MCH 29.1 26.0 - 34.0 pg   MCHC 32.8 30.0 - 36.0 g/dL   RDW 56.2 13.0 - 86.5 %   Platelets 217 150 - 400 K/uL   nRBC 0.0 0.0 - 0.2 %   Neutrophils Relative % 39 %   Neutro Abs 2.1 1.7 - 7.7 K/uL   Lymphocytes Relative 39 %   Lymphs Abs 2.0 0.7 - 4.0 K/uL   Monocytes Relative 17 %   Monocytes Absolute 0.9 0.1 - 1.0 K/uL   Eosinophils Relative 4 %   Eosinophils Absolute 0.2 0.0 - 0.5 K/uL   Basophils Relative 1 %   Basophils Absolute 0.0 0.0 - 0.1 K/uL   Immature Granulocytes 0 %   Abs Immature Granulocytes 0.01 0.00 - 0.07 K/uL    Comment: Performed at [facility], [address] [place], [place] [address]  Urinalysis, Routine w reflex microscopic -Urine, Clean Catch     Status: None   Collection Time: 10/18/23  1:34 AM  Result Value Ref Range   Color, Urine YELLOW YELLOW   APPearance CLEAR CLEAR   Specific Gravity, Urine 1.013 1.005 - 1.030   pH 5.0 5.0 - 8.0   Glucose, UA NEGATIVE NEGATIVE mg/dL   Hgb urine dipstick NEGATIVE NEGATIVE   Bilirubin Urine NEGATIVE NEGATIVE    Ketones, ur NEGATIVE NEGATIVE mg/dL   Protein, ur NEGATIVE NEGATIVE mg/dL   Nitrite NEGATIVE NEGATIVE   Leukocytes,Ua NEGATIVE NEGATIVE    Comment: Performed at [facility], [address], [place], [place] [address]  Magnesium     Status: None   Collection Time: 10/18/23  1:34 AM  Result  Value Ref Range   Magnesium 2.0 1.7 - 2.4 mg/dL    Comment: Performed at [facility], [address] [place], [place] [address]  Rapid urine drug screen (hospital performed)     Status: None   Collection Time: 10/18/23  1:34 AM  Result Value Ref Range   Opiates NONE DETECTED NONE DETECTED   Cocaine NONE DETECTED NONE DETECTED   Benzodiazepines NONE DETECTED NONE DETECTED   Amphetamines NONE DETECTED NONE DETECTED   Tetrahydrocannabinol NONE DETECTED NONE DETECTED   Barbiturates NONE DETECTED NONE DETECTED    Comment: (NOTE) DRUG SCREEN FOR MEDICAL PURPOSES ONLY.  IF CONFIRMATION IS NEEDED FOR ANY PURPOSE, NOTIFY LAB WITHIN 5 DAYS.  LOWEST DETECTABLE LIMITS FOR URINE DRUG SCREEN Drug Class                     Cutoff (ng/mL) Amphetamine and metabolites    1000 Barbiturate and metabolites    200 Benzodiazepine                 200 Opiates and metabolites        300 Cocaine and metabolites        300 THC                            50 Performed at [facility], [address] [place], [place] [address]   I-Stat Lactic Acid     Status: None   Collection Time: 10/18/23  1:50 AM  Result Value Ref Range   Lactic Acid, Venous 0.9 0.5 - 1.9 mmol/L  Brain natriuretic peptide     Status: Abnormal   Collection Time: 10/18/23  2:00 AM  Result Value Ref Range   B Natriuretic Peptide 317.2 (H) 0.0 - 100.0 pg/mL    Comment: Performed at [facility], [address] [place], [place] [address]  Troponin I (High Sensitivity)     Status: None   Collection Time: 10/18/23  6:36 AM  Result Value Ref Range   Troponin I (High  Sensitivity) 7 <18 ng/L    Comment: (NOTE) Elevated high sensitivity troponin I (hsTnI) values and significant  changes across serial measurements may suggest ACS but many other  chronic and acute conditions are known to elevate hsTnI results.  Refer to the "Links" section for chest pain algorithms and additional  guidance. Performed at [facility], [address] [place], [place] [address]   CBC     Status: Abnormal   Collection Time: 10/18/23  6:36 AM  Result Value Ref Range   WBC 4.7 4.0 - 10.5 K/uL   RBC 3.23 (L) 4.22 - 5.81 MIL/uL   Hemoglobin 9.4 (L) 13.0 - 17.0 g/dL   HCT 42.5 (L) 95.6 - 38.7 %   MCV 87.3 80.0 - 100.0 fL   MCH 29.1 26.0 - 34.0 pg   MCHC 33.3 30.0 - 36.0 g/dL   RDW 56.4 33.2 - 95.1 %   Platelets 197 150 - 400 K/uL   nRBC 0.0 0.0 - 0.2 %    Comment: Performed at [facility], [address] [place], [place] [address]  Creatinine, serum     Status: Abnormal   Collection Time: 10/18/23  6:36 AM  Result Value Ref Range   Creatinine, Ser 1.50 (H) 0.61 - 1.24 mg/dL   GFR, Estimated 48 (L) >60 mL/min    Comment: (NOTE) Calculated using the  CKD-EPI Creatinine Equation (2021) Performed at [facility], [address] [place], [place] [address]    DG Chest 2 View Result Date: 10/18/2023 CLINICAL DATA:  Syncope and hypotension EXAM: CHEST - 2 VIEW COMPARISON:  12/27/2019 FINDINGS: Left chest wall ICD. Stable cardiomediastinal silhouette. Aortic atherosclerotic calcification. No focal consolidation, pleural effusion, or pneumothorax. No displaced rib fractures. IMPRESSION: No acute cardiopulmonary disease. Electronically Signed   By: Minerva Fester M.D.   On: 10/18/2023 02:06    Pending Labs Unresulted Labs (From admission, onward)     Start     Ordered   10/19/23 0500  Basic metabolic panel  Tomorrow morning,   R        10/18/23 0603   10/19/23 0500  CBC with Differential/Platelet  Tomorrow  morning,   R        10/18/23 0603   10/18/23 0557  Respiratory (~20 pathogens) panel by PCR  (Respiratory panel by PCR (~20 pathogens, ~24 hr TAT)  w precautions)  Once,   URGENT        10/18/23 0556            Vitals/Pain Today's Vitals   10/18/23 0637 10/18/23 0716 10/18/23 0942 10/18/23 0945  BP: 119/79  (!) 151/90 138/74  Pulse: 60  61 72  Resp: 11  18 16   Temp: 98.2 F (36.8 C)     TempSrc: Oral     SpO2: 100%  100% 100%  Weight:      Height:      PainSc:  0-No pain      Isolation Precautions Droplet precaution  Medications Medications  atorvastatin (LIPITOR) tablet 40 mg (has no administration in time range)  0.9 %  sodium chloride infusion ( Intravenous New Bag/Given 10/18/23 0624)  guaiFENesin (MUCINEX) 12 hr tablet 600 mg (600 mg Oral Given 10/18/23 4696)  benzonatate (TESSALON) capsule 100 mg (100 mg Oral Given 10/18/23 2952)  acetaminophen (TYLENOL) tablet 650 mg (has no administration in time range)    Or  acetaminophen (TYLENOL) suppository 650 mg (has no administration in time range)  senna-docusate (Senokot-S) tablet 1 tablet (has no administration in time range)  nicotine (NICODERM CQ - dosed in mg/24 hours) patch 14 mg (has no administration in time range)  fluticasone (FLONASE) 50 MCG/ACT nasal spray 1 spray (has no administration in time range)  gabapentin (NEURONTIN) capsule 300 mg (has no administration in time range)  aspirin EC tablet 81 mg (has no administration in time range)  carvedilol (COREG) tablet 12.5 mg (has no administration in time range)  primidone (MYSOLINE) tablet 25 mg (has no administration in time range)  rivaroxaban (XARELTO) tablet 10 mg (has no administration in time range)    Mobility walks     Focused Assessments Cardiac Assessment Handoff:    No results found for: "CKTOTAL", "CKMB", "CKMBINDEX", "TROPONINI" No results found for: "DDIMER" Does the Patient currently have chest pain? No    R Recommendations: See  Admitting Provider Note  Report given to:   Additional Notes:

## 2023-10-18 NOTE — H&P (Signed)
PCP:   [facility]   Chief Complaint:  Recovered syncopal episodes  HPI: This is a 77 year old male with past medical history significant for history of ischemic cardiomyopathy EF 20%, nonsustained V. tach sp ICD (most recent EF 55%), h/o recurrent DVT on Xarelto, HTN, COPD, paranoid disorder, cocaine dependence, rheumatoid arthritis, essential tremor, tobacco use.  For the past week he has had a hacking cough which has frequently resulted in syncopal episodes or episodes of emesis.  He denies ongoing nausea or diarrhea.  He denies fevers or chills.  He endorses some wheezing.  He denies chest pains or palpitations.  He states his defibrillator has not discharged.  Today after his last syncopal episode, his blood pressure check revealed a systolic blood pressure of 88. Per patient he has been compliant with his medication including his heart/BP medications.  His daughter brought him to the ER.  Presenting vitals 92/59, 83, 18, temperature 97.4.  [Repeat temperature normalized].  UA, chest x-ray normal.  Hemoglobin 9.9 [previous 12.6, done 06/15/2019].  Creatinine 1.46.  EDP was unable to interrogate ICD in ER] interrogation device not working].  EKG NSR, HR 77, QTc 417.  Troponin 8 troponin 8 Orthostatic vitals in ER negative.  Review of Systems:  Per HPI  Past Medical History: Past Medical History:  Diagnosis Date   AICD (automatic cardioverter/defibrillator) present    Arthritis    hands, hip knees ankles,back   Chronic systolic CHF (congestive heart failure) (HCC)    DVT (deep venous thrombosis) (HCC)    Non-ischemic cardiomyopathy (HCC) 2017   clean cath, EF 30%   Past Surgical History:  Procedure Laterality Date   CARDIAC CATHETERIZATION  2017   at [facility] in [place], clean, EF 30%   ICD IMPLANT N/A 12/26/2019   Procedure: ICD IMPLANT;  Surgeon: Duke Salvia, MD;  Location: [facility] INVASIVE CV LAB;  Service: Cardiovascular;  Laterality: N/A;   INGUINAL HERNIA REPAIR  Right 06/11/2020   Procedure: OPEN RIGHT INGUINAL HERNIA REPAIR WITH TAP BLOCK;  Surgeon: Berna Bue, MD;  Location: [facility] ORS;  Service: General;  Laterality: Right;   INSERTION OF MESH Right 06/11/2020   Procedure: INSERTION OF MESH;  Surgeon: Berna Bue, MD;  Location: [facility] ORS;  Service: General;  Laterality: Right;   KNEE SURGERY     Arthroscopic Left knee   RIGHT/LEFT HEART CATH AND CORONARY ANGIOGRAPHY N/A 12/23/2019   Procedure: RIGHT/LEFT HEART CATH AND CORONARY ANGIOGRAPHY;  Surgeon: Laurey Morale, MD;  Location: [facility] INVASIVE CV LAB;  Service: Cardiovascular;  Laterality: N/A;    Medications: Prior to Admission medications   Medication Sig Start Date End Date Taking? Authorizing Provider  aspirin EC 81 MG tablet Take 81 mg by mouth daily.   Yes [provider]  atorvastatin (LIPITOR) 40 MG tablet Take 40 mg by mouth daily.   Yes [provider]  carvedilol (COREG) 25 MG tablet Take 12.5 mg by mouth 2 (two) times daily with a meal.   Yes [provider]  fluticasone (FLONASE) 50 MCG/ACT nasal spray Place 1 spray into the nose daily. 07/17/23  Yes [provider]  furosemide (LASIX) 20 MG tablet Take 20 mg by mouth.   Yes [provider]  primidone (MYSOLINE) 50 MG tablet Take 25 mg by mouth at bedtime. 09/17/23  Yes [provider]  rivaroxaban (XARELTO) 10 MG TABS tablet Take 1 tablet (10 mg total) by mouth daily. Start 12/29/19 03/19/20  Yes Laurey Morale, MD  sacubitril-valsartan (ENTRESTO) 49-51 MG Take 1 tablet by mouth 2 (two) times daily. 03/19/20  Yes Laurey Morale, MD  sildenafil (VIAGRA) 100 MG tablet Take 100 mg by mouth as needed. 02/19/21  Yes [provider]  brimonidine (ALPHAGAN P) 0.1 % SOLN Place 1 drop into both eyes in the morning and at bedtime. 02/07/21   [provider]  fluticasone (VERAMYST) 27.5 MCG/SPRAY nasal spray Place 1 spray into the nose daily.    [provider]   gabapentin (NEURONTIN) 300 MG capsule Take 300 mg by mouth at bedtime.    [provider]  ipratropium (ATROVENT) 0.03 % nasal spray Place 2 sprays into both nostrils 3 (three) times daily.    [provider]  Ipratropium-Albuterol (COMBIVENT RESPIMAT) 20-100 MCG/ACT AERS respimat Inhale 1 puff into the lungs every 6 (six) hours as needed for wheezing or shortness of breath (or coughing). Patient not taking: Reported on 06/12/2022 04/10/17   [name], MD  ketotifen (ZADITOR) 0.025 % ophthalmic solution 1 drop 2 (two) times daily as needed (allergies).    [provider]  loperamide (IMODIUM A-D) 2 MG tablet Take 1 tablet (2 mg total) by mouth 4 (four) times daily as needed for diarrhea or loose stools. 06/15/22   Burnadette Pop, MD  loratadine (CLARITIN) 10 MG tablet Take 10 mg by mouth daily.    [provider]    Allergies:   Allergies  Allergen Reactions   Lisinopril Cough    Social History:  reports that he has been smoking cigarettes. He has a 25 pack-year smoking history. He has never used smokeless tobacco. He reports current alcohol use. He reports current drug use. Drug: Marijuana.  Family History: Family History  Problem Relation Age of Onset   Stroke Mother        Multiple strokes, died at 59   Heart attack Father 28    Physical Exam: Vitals:   10/18/23 0109 10/18/23 0113 10/18/23 0115 10/18/23 0120  BP:    108/72  Pulse:   77 81  Resp:  12 18 14   Temp:  (!) 97.4 F (36.3 C)    SpO2:   96% 93%  Weight: 79.4 kg     Height: 6\' 1"  (1.854 m)       General:  Alert and oriented times three, well developed and nourished, no acute distress Eyes: PERRLA, pink conjunctiva, no scleral icterus ENT: Moist oral mucosa, neck supple, no thyromegaly Lungs: clear to ascultation, no wheeze, no crackles, no use of accessory muscles Cardiovascular: regular rate and rhythm, no regurgitation, no gallops, no murmurs. No carotid bruits, no  JVD Abdomen: soft, positive BS, non-tender, non-distended, no organomegaly, not an acute abdomen GU: not examined Neuro: CN II - XII grossly intact, sensation intact Musculoskeletal: strength 5/5 all extremities, no clubbing, cyanosis or edema Skin: no rash, no subcutaneous crepitation, no decubitus Psych: appropriate patient   Labs on Admission:  Recent Labs    10/18/23 0134  NA 133*  K 4.4  CL 104  CO2 22  GLUCOSE 86  BUN 31*  CREATININE 1.46*  CALCIUM 8.4*  MG 2.0   Recent Labs    10/18/23 0134  AST 38  ALT 50*  ALKPHOS 61  BILITOT 0.5  PROT 6.2*  ALBUMIN 3.1*    Recent Labs    10/18/23 0134  WBC 5.2  NEUTROABS 2.1  HGB 9.9*  HCT 30.2*  MCV 88.8  PLT 217    Radiological Exams on Admission: DG  Chest 2 View Result Date: 10/18/2023 CLINICAL DATA:  Syncope and hypotension EXAM: CHEST - 2 VIEW COMPARISON:  12/27/2019 FINDINGS: Left chest wall ICD. Stable cardiomediastinal silhouette. Aortic atherosclerotic calcification. No focal consolidation, pleural effusion, or pneumothorax. No displaced rib fractures. IMPRESSION: No acute cardiopulmonary disease. Electronically Signed   By: Minerva Fester M.D.   On: 10/18/2023 02:06    Assessment/Plan Present on Admission:  Recurrent syncopal episodes -Coughing related however given patient's extensive cardiac history, admit to [facility]. -May be due to hypotension.  Orthostatic vitals negative but patient with borderline blood pressure in ER.  Hold Coreg, Entresto, Lasix -Consult cardiology, interrogate pacemaker. -2D echo ordered -IV fluid hydration -Patient on Xarelto.  Unsafe with recurrent syncopal episodes -Mucinex scheduled ordered.  Tessalon Perles as needed cough -Urine drug screen ordered   CAD //  Acute combined systolic and diastolic heart failure -Coreg 08.6, Entresto, Lasix on hold with patient's borderline hypotension -Continue baby aspirin.  -Patient allergic to lisinopril   Essential  tremor -Mysoline recently started.  Patient denies taking  COPD (chronic obstructive pulmonary disease) (HCC) -Nebulizes as needed   HLD -Atorvastatin   Rheumatoid arthritis  Jessica Seidman 10/18/2023, 4:26 AM

## 2023-10-18 NOTE — Progress Notes (Signed)
[facility] PLAN OF CARE NOTE Patient: Brandon Frye NUU:[id number]   PCP: [facility] DOB: September 02, 1946   DOA: 10/18/2023   DOS: 10/18/2023    Patient was admitted by my colleague earlier on 10/18/2023. I have reviewed the H&P as well as assessment and plan and agree with the same. Important changes in the plan are listed below.  Plan of care: Principal Problem:   Syncope and collapse Active Problems:   Cardiomyopathy (HCC)   COPD (chronic obstructive pulmonary disease) (HCC)   (HFpEF) heart failure with preserved ejection fraction (HCC)   Hypotension   HLD (hyperlipidemia) Pt presented with what appears to be a vasovagal syncope after coughing episode.  So far tele has been unremarkable. And pt is return back to his baseline.  2 view CXR unremarkable.  Exam non focal.  Left basal crackles that clears are cough.  Vitals were stable  Orthostatic was performed but not after standing for 3 minutes.  Will reattempt to get the device interrogated. Pt reports he has never had a shock. Reports that 'last time when they checked the device it was fine' Some PVC burrden seen.  Pt reports that he has lost 35 lbs in last 6 months since he changed his diet.  He did have some low BP when he arrived.  At present he appears stable. And I do not think [facility] needs [facility].  [facility] at [facility].  Also we will recheck orthostatic and interrogate his device.  Echocardiogram is ordered Pt wants to go home now! I have told him that it will be not medically advice that he leaves before the work up is complete.   Level of care: Telemetry    Author: Lynden Oxford, MD  [facility] 10/18/2023 9:51 AM   If 7PM-7AM, please contact night-coverage at [email or web address]

## 2023-10-18 NOTE — ED Notes (Signed)
RN successfully interrogated pacemaker

## 2023-10-18 NOTE — ED Triage Notes (Addendum)
Pt reports having an episode of hypotension at home (88 over something). Pt has had a cough x 3 weeks and when he coughed tonight, his wife states that he rolled over, falling out of the bed and passed out. Pt's wife went over to him and immediately woke him up.

## 2023-12-11 ENCOUNTER — Encounter: Payer: Self-pay | Admitting: Emergency Medicine

## 2024-09-16 NOTE — Progress Notes (Signed)
 [facility] NEW PATIENT EVALUATION   Referring Physician:  Milissa Jayson DASEN, MD Primary Care Physician:  Jayson DASEN Milissa, MD   Patient ID:  Brandon Frye is a 78 y.o. (DOB 05/09/1946) male.    Subjective   HPI:  Brandon Frye is a 78 y.o. male who returns for follow-up due to bilateral upper extremity essential tremors.  Tremors have improved.  He does continue to occasionally note that the tremors get exacerbated.  Tremors get exacerbated typically when he is holding objects.  He is continue to utilize primidone  50 mg p.o. nightly.  He denies any side effects.  He feels like this is offering some benefit.  He denies any new or concerning neurological features.  Past Medical History, Past Surgery History, Social History, and Family History were reviewed and updated.    Past Medical History:  Diagnosis Date  . Arthritis   . Blood clot in vein     Past Surgical History:  Procedure Laterality Date  . Cardiac defibrillator placement    . Cardiac defibrillator placement    . Cardiac defibrillator placement    . Knee surgery     No family history on file. Social History   Socioeconomic History  . Marital status: Single  Tobacco Use  . Smoking status: Every Day    Current packs/day: 0.50    Average packs/day: 1.5 packs/day for 61.9 years (91.9 ttl pk-yrs)    Types: Cigarettes    Start date: 1964    Passive exposure: Current  . Smokeless tobacco: Never  Vaping Use  . Vaping status: Never Used  Substance and Sexual Activity  . Alcohol use: No  . Drug use: No  . Sexual activity: Not Currently      Current Home Medications  Medication Sig  acetaminophen  (TYLENOL ) 500 mg tablet Take two tablets (1,000 mg dose) by mouth.  albuterol  sulfate HFA (PROVENTIL ,VENTOLIN ,PROAIR ) 108 (90 Base) MCG/ACT inhaler Inhale into the lungs.  aspirin  81 MG EC tablet Take one tablet (81 mg dose) by mouth.  atorvastatin  (LIPITOR) 40 mg tablet Take one tablet  (40 mg dose) by mouth daily.  brimonidine  (ALPHAGAN  P) 0.1% SOLN one drop 2 (two) times daily.  carvedilol  (COREG ) 12.5 mg tablet Take one tablet (12.5 mg dose) by mouth 2 (two) times a day with meals.  digoxin  (LANOXIN ,DIGITEK,DIGOX ) 125 mcg tablet Take 0.125 mg by mouth. Patient not taking: Reported on 03/15/2021  ENTRESTO  24-26 MG TABS per tablet Take one tablet by mouth 2 (two) times daily.  fluticasone  propionate (FLONASE ) 50 mcg/actuation nasal spray by Nasal route.  fluticasone -salmeterol (ADVAIR DISKUS/WIXELA) 100-50 mcg/dose AEPB inhalation powder Inhale one puff into the lungs 2 (two) times daily.  gabapentin  (NEURONTIN ) 300 mg capsule Take one capsule (300 mg dose) by mouth.  guaiFENesin  (MUCINEX ) 600 mg 12 hr tablet Take one tablet (600 mg dose) by mouth 2 (two) times daily.  ipratropium (ATROVENT) 0.03% nasal spray by Nasal route.  loratadine  (CLARITIN ) 10 MG tablet Take one tablet (10 mg dose) by mouth daily.  naloxone (NARCAN) nasal spray (TAKE HOME PACK) by Nasal route.  predniSONE  (DELTASONE ) 10 mg tablet Take four tablets (40 mg dose) by mouth.  primidone  (MYSOLINE ) 50 mg tablet 1 tab by mouth at bedtime  rivaroxaban  (XARELTO ) 10 mg TABS Take one tablet (10 mg dose) by mouth daily.  sildenafil citrate (VIAGRA) 100 mg tablet Take one half tablet (50 mg dose) by mouth.  traMADol (ULTRAM) 50 mg tablet Take by mouth.  traZODone (DESYREL)  150 MG tablet Take one tablet (150 mg dose) by mouth.    The patient is allergic to lisinopril.  Review of Systems is complete and negative except as noted in History of Present Illness.  Objective    PHYSICAL EXAM: BP 131/81 (BP Location: Left Upper Arm, Patient Position: Sitting)   Pulse 78   Ht 6' 1 (1.854 m)   Wt 178 lb (80.7 kg)   SpO2 97%   BMI 23.48 kg/m  GENERAL: Awake, alert, in no acute distress Psych: Affect appropriate for situation, patient is calm and cooperative with examination Head: Normocephalic and atraumatic,  without obvious abnormality EENT: Normal conjunctivae, dry mucous membranes, no OP obstruction LUNGS: Normal respiratory effort. Non-labored breathing on room air. Clear to auscultation bilaterally. CV: Auscultation: regular rate and rhythm. Extremities: Warm, well perfused, without obvious deformity  MENTAL STATUS EXAM: Orientation: Alert and oriented to person, place and time. Memory: Cooperative, follows commands well. Recent and remote memory normal.. Attention, concentration: Attention span and concentration are normal. Language: Speech is clear and language is normal. Fund of knowledge: Aware of current events, vocabulary appropriate for patient age.   CRANIAL NERVES: CN 2 (Optic): Visual fields intact to confrontation, funduscopic examination without optic disk pallor or edema, retinal vessels are normal. CN 3,4,6 (EOM): Pupils equal and reactive to light. Full extraocular eye movement without nystagmus. CN 5 (Trigeminal): Facial sensation is normal, no weakness of masticatory muscles. CN 7 (Facial): No facial weakness or asymmetry. CN 8 (Auditory): Auditory acuity grossly normal. CN 9,10 (Glossophar): The uvula is midline, the palate elevates symmetrically. CN 11 (spinal access): Normal sternocleidomastoid and trapezius strength. CN 12 (Hypoglossal): The tongue is midline. No atrophy or fasciculations.   MOTOR: Muscle Strength: Strength - 5/5 and symmetric in the upper and lower extremities, no pronation or drift. Muscle Tone: Tone and muscle bulk are normal in the upper and lower extremities.   REFLEXES:   DTRs - 2+ and symmetrical in all four extremities, plantar responses are flexor bilaterally.   COORDINATION:   Intact finger-to-nose, heel-to-shin, and rapid alternating movements, slight fine action tremor in the bilateral hands.   SENSATION:  Intact to light touch, vibration, pinprick.  No sensory extinction to DSS. Negative Romberg test.  GAIT: Routine gait  normal.   DIAGNOSTIC STUDIES: No diagnostic imaging to review  LABORATORY STUDIES:   No visits with results within 1 Year(s) from this visit.  Latest known visit with results is:  Admission on 06/11/2022, Discharged on 06/11/2022  Component Date Value Ref Range Status  . WBC 06/11/2022 7.5  5.1 - 10.8 thou/mcL Final  . RBC 06/11/2022 4.91  4.05 - 5.64 million/mcL Final  . HGB 06/11/2022 14.4  13.5 - 17.5 gm/dL Final  . HCT 91/90/7976 43.2  40.5 - 52.5 % Final  . MCV 06/11/2022 88  83 - 97 fL Final  . MCH 06/11/2022 29.3  28.0 - 33.0 pg Final  . MCHC 06/11/2022 33.3  32.0 - 36.0 gm/dL Final  . Plt Ct 91/90/7976 180  150 - 400 thou/mcL Final  . RDW SD 06/11/2022 44.9  36.0 - 47.0 fL Final  . MPV 06/11/2022 10.1  8.9 - 11.0 fL Final  . NRBC% 06/11/2022 0.0  0 /100WBC Final  . Absolute NRBC Count 06/11/2022 0.000  0 thou/mcL Final  . NEUTROPHIL % 06/11/2022 62.8  50.0 - 70.0 % Final  . LYMPHOCYTE % 06/11/2022 21.3 (L)  25.0 - 40.0 % Final  . MONOCYTE % 06/11/2022 13.0 (H)  4.0 -  12.0 % Final  . Eosinophil % 06/11/2022 2.1  1.0 - 6.0 % Final  . BASOPHIL % 06/11/2022 0.5  0.0 - 2.0 % Final  . IG% 06/11/2022 0.300  0.001 - 0.429 % Final  . ABSOLUTE NEUTROPHIL COUNT 06/11/2022 4.70  1.50 - 7.50 thou/mcL Final  . ABSOLUTE LYMPHOCYTE COUNT 06/11/2022 1.6  1.0 - 4.5 thou/mcL Final  . Absolute Monocyte Count 06/11/2022 1.0 (H)  0.1 - 0.8 thou/mcL Final  . Absolute Eosinophil Count 06/11/2022 0.2  0.0 - 0.5 thou/mcL Final  . Absolute Basophil Count 06/11/2022 0.0  0.0 - 0.2 thou/mcL Final  . Absolute Immature Granulocyte Count 06/11/2022 0.020  0.001 - 0.031 thou/mcL  Final  . Na 06/11/2022 133 (L)  136 - 146 mmol/L Final  . Potassium 06/11/2022 5.4  3.7 - 5.4 mmol/L Final  . Cl 06/11/2022 99  97 - 108 mmol/L Final  . CO2 06/11/2022 22  20 - 32 mmol/L Final  . AGAP 06/11/2022 12  7 - 16 mmol/L Final  . Glucose 06/11/2022 94  65 - 99 mg/dL Final  . BUN 91/90/7976 34 (H)  8 - 27 mg/dL Final   . Creatinine 91/90/7976 1.97 (H)  0.76 - 1.27 mg/dL Final  . Ca 91/90/7976 9.7  8.6 - 10.2 mg/dL Final  . ALK PHOS 91/90/7976 87  25 - 160 U/L Final  . T Bili 06/11/2022 0.45  0.00 - 1.20 mg/dL Final  . Total Protein 06/11/2022 8.2  6.0 - 8.5 gm/dL Final  . Alb 91/90/7976 4.2  3.5 - 4.8 gm/dL Final  . GLOBULIN 91/90/7976 4.0  1.5 - 4.5 gm/dL Final  . ALBUMIN/GLOBULIN RATIO 06/11/2022 1.1  1.1 - 2.5 Final  . BUN/CREAT RATIO 06/11/2022 17.3  11.0 - 26.0 Final  . ALT 06/11/2022 9  0 - 55 U/L Final  . AST 06/11/2022 21  0 - 40 U/L Final  . eGFR 06/11/2022 35  mL/min/1.27m2 Final  . Lipase 06/11/2022 28  0 - 59 U/L Final      Assessment   78 y.o. male with PMHx as above who presents for follow-up due to bilateral upper extremity essential tremors.  Patient's tremors have improved.  He does continue to occasionally have some exacerbations of the tremors that typically occurs when he is holding objects.  Otherwise, he is doing well and is pleased with his current status.  He is continue on primidone  50 mg.  I am hesitant to increase his primidone  due to potential drug-drug interaction with his Xarelto .  I have also not recommended that he begin propranolol as he is already on carvedilol .  I therefore recommend that we check some labs to see if there are any abnormalities that might be corrected I could help improve his tremors.  Otherwise, we will continue with the primidone  as there does not appear to be any interaction with the Xarelto .  Plan   1. 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  All new prescription medications and changes in  current prescription dosages were discussed with the patient, including patient education, medication name, use, dosage, potential side effects, drug interactions, consequences of not using/taking and special instructions. Patient expressed understanding. No barriers to adherence.  Follow up in about 3 months (around 12/17/2024).  Electronically Signed: Fonda FREDRIK Minus, DNP, AGNP-C Neurology Nurse Practitioner  [facility] Neurology [facility] 09/16/2024 9:29 AM  *This note was dictated with voice recognition software. Inadvertently, similar sounding words can, sometimes, get transcribed incorrectly

## 2024-10-05 ENCOUNTER — Emergency Department (HOSPITAL_COMMUNITY)

## 2024-10-05 ENCOUNTER — Encounter (HOSPITAL_COMMUNITY): Payer: Self-pay

## 2024-10-05 ENCOUNTER — Inpatient Hospital Stay (HOSPITAL_COMMUNITY): Admission: EM | Admit: 2024-10-05 | Discharge: 2024-10-12 | DRG: 564 | Disposition: A

## 2024-10-05 ENCOUNTER — Other Ambulatory Visit: Payer: Self-pay

## 2024-10-05 DIAGNOSIS — F10920 Alcohol use, unspecified with intoxication, uncomplicated: Secondary | ICD-10-CM | POA: Diagnosis not present

## 2024-10-05 DIAGNOSIS — N179 Acute kidney failure, unspecified: Secondary | ICD-10-CM | POA: Diagnosis present

## 2024-10-05 DIAGNOSIS — I5032 Chronic diastolic (congestive) heart failure: Secondary | ICD-10-CM | POA: Diagnosis not present

## 2024-10-05 DIAGNOSIS — F10929 Alcohol use, unspecified with intoxication, unspecified: Secondary | ICD-10-CM | POA: Insufficient documentation

## 2024-10-05 DIAGNOSIS — S27892A Contusion of other specified intrathoracic organs, initial encounter: Secondary | ICD-10-CM | POA: Diagnosis not present

## 2024-10-05 DIAGNOSIS — S2220XA Unspecified fracture of sternum, initial encounter for closed fracture: Secondary | ICD-10-CM | POA: Diagnosis not present

## 2024-10-05 DIAGNOSIS — N183 Chronic kidney disease, stage 3 unspecified: Secondary | ICD-10-CM

## 2024-10-05 DIAGNOSIS — Z8679 Personal history of other diseases of the circulatory system: Secondary | ICD-10-CM | POA: Diagnosis not present

## 2024-10-05 DIAGNOSIS — S2242XA Multiple fractures of ribs, left side, initial encounter for closed fracture: Secondary | ICD-10-CM

## 2024-10-05 LAB — I-STAT CHEM 8, ED
BUN: 39 mg/dL — ABNORMAL HIGH (ref 8–23)
Calcium, Ion: 1.08 mmol/L — ABNORMAL LOW (ref 1.15–1.40)
Chloride: 100 mmol/L (ref 98–111)
Creatinine, Ser: 2.4 mg/dL — ABNORMAL HIGH (ref 0.61–1.24)
Glucose, Bld: 111 mg/dL — ABNORMAL HIGH (ref 70–99)
HCT: 35 % — ABNORMAL LOW (ref 39.0–52.0)
Hemoglobin: 11.9 g/dL — ABNORMAL LOW (ref 13.0–17.0)
Potassium: 4.8 mmol/L (ref 3.5–5.1)
Sodium: 137 mmol/L (ref 135–145)
TCO2: 26 mmol/L (ref 22–32)

## 2024-10-05 LAB — SAMPLE TO BLOOD BANK

## 2024-10-05 LAB — COMPREHENSIVE METABOLIC PANEL WITH GFR
ALT: 15 U/L (ref 0–44)
AST: 39 U/L (ref 15–41)
Albumin: 3.5 g/dL (ref 3.5–5.0)
Alkaline Phosphatase: 54 U/L (ref 38–126)
Anion gap: 11 (ref 5–15)
BUN: 28 mg/dL — ABNORMAL HIGH (ref 8–23)
CO2: 27 mmol/L (ref 22–32)
Calcium: 8.7 mg/dL — ABNORMAL LOW (ref 8.9–10.3)
Chloride: 99 mmol/L (ref 98–111)
Creatinine, Ser: 2.14 mg/dL — ABNORMAL HIGH (ref 0.61–1.24)
GFR, Estimated: 31 mL/min — ABNORMAL LOW (ref >60)
Glucose, Bld: 116 mg/dL — ABNORMAL HIGH (ref 70–99)
Potassium: 5.2 mmol/L — ABNORMAL HIGH (ref 3.5–5.1)
Sodium: 137 mmol/L (ref 135–145)
Total Bilirubin: 0.9 mg/dL (ref 0.0–1.2)
Total Protein: 6.4 g/dL — ABNORMAL LOW (ref 6.5–8.1)

## 2024-10-05 LAB — URINALYSIS, ROUTINE W REFLEX MICROSCOPIC
Bilirubin Urine: NEGATIVE
Glucose, UA: NEGATIVE mg/dL
Ketones, ur: NEGATIVE mg/dL
Leukocytes,Ua: NEGATIVE
Nitrite: NEGATIVE
Protein, ur: NEGATIVE mg/dL
Specific Gravity, Urine: 1.018 (ref 1.005–1.030)
pH: 5 (ref 5.0–8.0)

## 2024-10-05 LAB — CBC
HCT: 31.6 % — ABNORMAL LOW (ref 39.0–52.0)
Hemoglobin: 10.3 g/dL — ABNORMAL LOW (ref 13.0–17.0)
MCH: 29.1 pg (ref 26.0–34.0)
MCHC: 32.6 g/dL (ref 30.0–36.0)
MCV: 89.3 fL (ref 80.0–100.0)
Platelets: 163 K/uL (ref 150–400)
RBC: 3.54 MIL/uL — ABNORMAL LOW (ref 4.22–5.81)
RDW: 14.3 % (ref 11.5–15.5)
WBC: 5.8 K/uL (ref 4.0–10.5)
nRBC: 0 % (ref 0.0–0.2)

## 2024-10-05 LAB — I-STAT CG4 LACTIC ACID, ED: Lactic Acid, Venous: 2.7 mmol/L (ref 0.5–1.9)

## 2024-10-05 LAB — PROTIME-INR
INR: 1.5 — ABNORMAL HIGH (ref 0.8–1.2)
Prothrombin Time: 18.9 s — ABNORMAL HIGH (ref 11.4–15.2)

## 2024-10-05 LAB — ETHANOL: Alcohol, Ethyl (B): 97 mg/dL — ABNORMAL HIGH (ref <15)

## 2024-10-05 MED ORDER — HYDRALAZINE HCL 20 MG/ML IJ SOLN: 10.0000 mg | INTRAMUSCULAR | Status: DC | PRN

## 2024-10-05 MED ORDER — ACETAMINOPHEN 500 MG PO TABS
1000.0000 mg | ORAL_TABLET | Freq: Four times a day (QID) | ORAL | Status: DC
  Administered 2024-10-05 – 2024-10-12 (×15): 1000 mg via ORAL
  Filled 2024-10-05 (×19): qty 2

## 2024-10-05 MED ORDER — IOHEXOL 350 MG/ML SOLN
75.0000 mL | Freq: Once | INTRAVENOUS | Status: AC | PRN
  Administered 2024-10-05: 75 mL via INTRAVENOUS

## 2024-10-05 MED ORDER — THIAMINE HCL 100 MG/ML IJ SOLN: 100.0000 mg | Freq: Every day | INTRAMUSCULAR | Status: DC

## 2024-10-05 MED ORDER — CARVEDILOL 12.5 MG PO TABS
12.5000 mg | ORAL_TABLET | Freq: Two times a day (BID) | ORAL | Status: DC
  Administered 2024-10-06 (×2): 12.5 mg via ORAL
  Filled 2024-10-05 (×2): qty 1

## 2024-10-05 MED ORDER — HYDROMORPHONE HCL 1 MG/ML IJ SOLN
0.5000 mg | INTRAMUSCULAR | Status: DC | PRN
  Administered 2024-10-06: 0.5 mg via INTRAVENOUS
  Filled 2024-10-05: qty 1

## 2024-10-05 MED ORDER — ADULT MULTIVITAMIN W/MINERALS CH
1.0000 | ORAL_TABLET | Freq: Every day | ORAL | Status: DC
  Administered 2024-10-06 – 2024-10-10 (×5): 1 via ORAL
  Filled 2024-10-05 (×5): qty 1

## 2024-10-05 MED ORDER — ATORVASTATIN CALCIUM 40 MG PO TABS
40.0000 mg | ORAL_TABLET | Freq: Every day | ORAL | Status: DC
  Administered 2024-10-06 – 2024-10-12 (×7): 40 mg via ORAL
  Filled 2024-10-05 (×7): qty 1

## 2024-10-05 MED ORDER — PRIMIDONE 50 MG PO TABS
25.0000 mg | ORAL_TABLET | Freq: Every day | ORAL | Status: DC
  Administered 2024-10-05 – 2024-10-11 (×6): 25 mg via ORAL
  Filled 2024-10-05 (×8): qty 0.5

## 2024-10-05 MED ORDER — METHOCARBAMOL 1000 MG/10ML IJ SOLN
500.0000 mg | Freq: Three times a day (TID) | INTRAMUSCULAR | Status: DC
  Administered 2024-10-07: 500 mg via INTRAVENOUS
  Filled 2024-10-05 (×2): qty 10

## 2024-10-05 MED ORDER — DOCUSATE SODIUM 100 MG PO CAPS
100.0000 mg | ORAL_CAPSULE | Freq: Two times a day (BID) | ORAL | Status: DC
  Administered 2024-10-05 – 2024-10-09 (×7): 100 mg via ORAL
  Filled 2024-10-05 (×7): qty 1

## 2024-10-05 MED ORDER — BRIMONIDINE TARTRATE 0.15 % OP SOLN
1.0000 [drp] | Freq: Two times a day (BID) | OPHTHALMIC | Status: DC
  Administered 2024-10-06 – 2024-10-11 (×10): 1 [drp] via OPHTHALMIC
  Filled 2024-10-05: qty 5

## 2024-10-05 MED ORDER — PROTHROMBIN COMPLEX CONC HUMAN 500 UNITS IV KIT
2216.0000 [IU] | PACK | Status: AC
  Administered 2024-10-05: 2216 [IU] via INTRAVENOUS
  Filled 2024-10-05: qty 2216

## 2024-10-05 MED ORDER — ONDANSETRON HCL 4 MG/2ML IJ SOLN
4.0000 mg | Freq: Once | INTRAMUSCULAR | Status: AC
  Administered 2024-10-05: 4 mg via INTRAVENOUS
  Filled 2024-10-05: qty 2

## 2024-10-05 MED ORDER — BENZONATATE 100 MG PO CAPS
100.0000 mg | ORAL_CAPSULE | Freq: Three times a day (TID) | ORAL | Status: DC | PRN
  Administered 2024-10-06 – 2024-10-12 (×8): 100 mg via ORAL
  Filled 2024-10-05 (×11): qty 1

## 2024-10-05 MED ORDER — FLUTICASONE PROPIONATE 50 MCG/ACT NA SUSP
1.0000 | Freq: Every day | NASAL | Status: DC
  Administered 2024-10-06 – 2024-10-11 (×6): 1 via NASAL
  Filled 2024-10-05: qty 16

## 2024-10-05 MED ORDER — METOPROLOL TARTRATE 5 MG/5ML IV SOLN: 5.0000 mg | Freq: Four times a day (QID) | INTRAVENOUS | Status: DC | PRN

## 2024-10-05 MED ORDER — OXYCODONE HCL 5 MG PO TABS
10.0000 mg | ORAL_TABLET | ORAL | Status: DC | PRN
  Administered 2024-10-06 (×2): 10 mg via ORAL
  Filled 2024-10-05 (×2): qty 2

## 2024-10-05 MED ORDER — THIAMINE MONONITRATE 100 MG PO TABS
100.0000 mg | ORAL_TABLET | Freq: Every day | ORAL | Status: DC
  Administered 2024-10-06 – 2024-10-12 (×7): 100 mg via ORAL
  Filled 2024-10-05 (×7): qty 1

## 2024-10-05 MED ORDER — IPRATROPIUM BROMIDE 0.02 % IN SOLN
0.5000 mg | Freq: Four times a day (QID) | RESPIRATORY_TRACT | Status: DC
  Administered 2024-10-05 – 2024-10-07 (×6): 0.5 mg via RESPIRATORY_TRACT
  Filled 2024-10-05 (×6): qty 2.5

## 2024-10-05 MED ORDER — FOLIC ACID 1 MG PO TABS
1.0000 mg | ORAL_TABLET | Freq: Every day | ORAL | Status: DC
  Administered 2024-10-06 – 2024-10-12 (×7): 1 mg via ORAL
  Filled 2024-10-05 (×7): qty 1

## 2024-10-05 MED ORDER — ONDANSETRON HCL 4 MG/2ML IJ SOLN: 4.0000 mg | Freq: Four times a day (QID) | INTRAMUSCULAR | Status: DC | PRN

## 2024-10-05 MED ORDER — MORPHINE SULFATE (PF) 4 MG/ML IV SOLN
4.0000 mg | Freq: Once | INTRAVENOUS | Status: AC
  Administered 2024-10-05: 4 mg via INTRAVENOUS
  Filled 2024-10-05: qty 1

## 2024-10-05 MED ORDER — ONDANSETRON 4 MG PO TBDP: 4.0000 mg | ORAL_TABLET | Freq: Four times a day (QID) | ORAL | Status: DC | PRN

## 2024-10-05 MED ORDER — POLYETHYLENE GLYCOL 3350 17 G PO PACK: 17.0000 g | PACK | Freq: Every day | ORAL | Status: DC | PRN

## 2024-10-05 MED ORDER — METHOCARBAMOL 500 MG PO TABS
500.0000 mg | ORAL_TABLET | Freq: Three times a day (TID) | ORAL | Status: AC
  Administered 2024-10-05 – 2024-10-08 (×6): 500 mg via ORAL
  Filled 2024-10-05 (×8): qty 1

## 2024-10-05 MED ORDER — OXYCODONE HCL 5 MG PO TABS: 5.0000 mg | ORAL_TABLET | ORAL | Status: DC | PRN

## 2024-10-05 MED ORDER — GABAPENTIN 400 MG PO CAPS
400.0000 mg | ORAL_CAPSULE | Freq: Two times a day (BID) | ORAL | Status: DC
  Administered 2024-10-05 – 2024-10-07 (×4): 400 mg via ORAL
  Filled 2024-10-05: qty 4
  Filled 2024-10-05: qty 1
  Filled 2024-10-05: qty 4
  Filled 2024-10-05: qty 1

## 2024-10-05 MED ORDER — LACTATED RINGERS IV SOLN: INTRAVENOUS | Status: AC

## 2024-10-05 MED ORDER — FUROSEMIDE 20 MG PO TABS: 20.0000 mg | ORAL_TABLET | Freq: Every day | ORAL | Status: DC

## 2024-10-05 MED ORDER — HYDROMORPHONE HCL 1 MG/ML IJ SOLN
1.0000 mg | INTRAMUSCULAR | Status: DC | PRN
  Administered 2024-10-06 (×2): 1 mg via INTRAVENOUS
  Filled 2024-10-05 (×2): qty 1

## 2024-10-05 MED ORDER — LORAZEPAM 1 MG PO TABS: 1.0000 mg | ORAL_TABLET | ORAL | Status: DC | PRN

## 2024-10-05 MED ORDER — IPRATROPIUM BROMIDE 0.03 % NA SOLN: 2.0000 | Freq: Three times a day (TID) | NASAL | Status: DC

## 2024-10-05 MED ORDER — LORAZEPAM 2 MG/ML IJ SOLN: 1.0000 mg | INTRAMUSCULAR | Status: DC | PRN
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
## 2024-10-05 NOTE — ED Notes (Signed)
 Trauma Response Nurse Documentation  Brandon Frye is a 78 y.o. male arriving to [facility] ED via EMS  On warfarin daily. Trauma was activated as a Level 2 based on the following trauma criteria Discretion of Emergency Department Physician.  Patient cleared for CT by Dr. Emil. Pt transported to CT with trauma response nurse present to monitor. RN remained with the patient throughout their absence from the department for clinical observation. GCS 15.  History   Past Medical History:  Diagnosis Date   AICD (automatic cardioverter/defibrillator) present    Arthritis    hands, hip knees ankles,back   Chronic systolic CHF (congestive heart failure) (HCC)    DVT (deep venous thrombosis) (HCC)    Non-ischemic cardiomyopathy (HCC) 2017   clean cath, EF 30%     Past Surgical History:  Procedure Laterality Date   CARDIAC CATHETERIZATION  2017   at [facility] in [place], clean, EF 30%   ICD IMPLANT N/A 12/26/2019   Procedure: ICD IMPLANT;  Surgeon: Fernande Elspeth BROCKS, MD;  Location: [facility] INVASIVE CV LAB;  Service: Cardiovascular;  Laterality: N/A;   INGUINAL HERNIA REPAIR Right 06/11/2020   Procedure: OPEN RIGHT INGUINAL HERNIA REPAIR WITH TAP BLOCK;  Surgeon: Signe Mitzie LABOR, MD;  Location: [facility] ORS;  Service: General;  Laterality: Right;   INSERTION OF MESH Right 06/11/2020   Procedure: INSERTION OF MESH;  Surgeon: Signe Mitzie LABOR, MD;  Location: [facility] ORS;  Service: General;  Laterality: Right;   KNEE SURGERY     Arthroscopic Left knee   RIGHT/LEFT HEART CATH AND CORONARY ANGIOGRAPHY N/A 12/23/2019   Procedure: RIGHT/LEFT HEART CATH AND CORONARY ANGIOGRAPHY;  Surgeon: Rolan Ezra RAMAN, MD;  Location: [facility] INVASIVE CV LAB;  Service: Cardiovascular;  Laterality: N/A;     Initial Focused Assessment (If applicable, or please see trauma documentation):   CT's Completed:   CT Head, CT C-Spine, CT Chest w/ contrast, and CT abdomen/pelvis w/ contrast   Interventions:   Plan for disposition:   {Trauma Dispo:26867}   Consults completed:  {Trauma Consults:26862} at ***.  Event Summary:  Bedside handoff with ED RN Rolin.    Brandon Frye  Trauma Response RN  Please call TRN at [phone] for further assistance.

## 2024-10-05 NOTE — Progress Notes (Signed)
 Orthopedic Tech Progress Note Patient Details:  Brandon Frye [birth year] [id number]  Patient ID: Brandon Frye, male   DOB: [birth year], 78 y.o.   MRN: [id number] Responded to Level 2 Trauma ortho tech not needed. Brandon Frye Brandon Frye 10/05/2024, 5:30 PM
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
## 2024-10-05 NOTE — TOC CM/SW Note (Signed)
 TOC consult received for substance abuse. SW to f/u with patient as appropriate.   Merilee Batty, MSN, RN Case Management [phone]

## 2024-10-06 ENCOUNTER — Inpatient Hospital Stay (HOSPITAL_COMMUNITY)

## 2024-10-06 DIAGNOSIS — I5032 Chronic diastolic (congestive) heart failure: Secondary | ICD-10-CM

## 2024-10-06 LAB — LACTIC ACID, PLASMA: Lactic Acid, Venous: 1.3 mmol/L (ref 0.5–1.9)

## 2024-10-06 LAB — BASIC METABOLIC PANEL WITH GFR
Anion gap: 8 (ref 5–15)
BUN: 28 mg/dL — ABNORMAL HIGH (ref 8–23)
CO2: 28 mmol/L (ref 22–32)
Calcium: 8.6 mg/dL — ABNORMAL LOW (ref 8.9–10.3)
Chloride: 101 mmol/L (ref 98–111)
Creatinine, Ser: 2.23 mg/dL — ABNORMAL HIGH (ref 0.61–1.24)
GFR, Estimated: 30 mL/min — ABNORMAL LOW (ref >60)
Glucose, Bld: 82 mg/dL (ref 70–99)
Potassium: 5.1 mmol/L (ref 3.5–5.1)
Sodium: 137 mmol/L (ref 135–145)

## 2024-10-06 LAB — CBC
HCT: 31.7 % — ABNORMAL LOW (ref 39.0–52.0)
Hemoglobin: 10.2 g/dL — ABNORMAL LOW (ref 13.0–17.0)
MCH: 29.1 pg (ref 26.0–34.0)
MCHC: 32.2 g/dL (ref 30.0–36.0)
MCV: 90.6 fL (ref 80.0–100.0)
Platelets: 141 K/uL — ABNORMAL LOW (ref 150–400)
RBC: 3.5 MIL/uL — ABNORMAL LOW (ref 4.22–5.81)
RDW: 14.4 % (ref 11.5–15.5)
WBC: 7.9 K/uL (ref 4.0–10.5)
nRBC: 0 % (ref 0.0–0.2)

## 2024-10-06 LAB — CREATININE, URINE, RANDOM: Creatinine, Urine: 132 mg/dL

## 2024-10-06 LAB — SODIUM, URINE, RANDOM: Sodium, Ur: 35 mmol/L

## 2024-10-06 LAB — URINALYSIS, ROUTINE W REFLEX MICROSCOPIC
Bilirubin Urine: NEGATIVE
Glucose, UA: NEGATIVE mg/dL
Hgb urine dipstick: NEGATIVE
Ketones, ur: NEGATIVE mg/dL
Leukocytes,Ua: NEGATIVE
Nitrite: NEGATIVE
Protein, ur: NEGATIVE mg/dL
Specific Gravity, Urine: <1.005 — ABNORMAL LOW (ref 1.005–1.030)
pH: 6 (ref 5.0–8.0)

## 2024-10-06 LAB — ECHOCARDIOGRAM COMPLETE
Area-P 1/2: 3.68 cm2
Height: 73 in
S' Lateral: 3 cm
Weight: 3120 [oz_av]

## 2024-10-06 LAB — PROTIME-INR
INR: 1.1 (ref 0.8–1.2)
Prothrombin Time: 15.1 s (ref 11.4–15.2)

## 2024-10-06 LAB — RAPID URINE DRUG SCREEN, HOSP PERFORMED
Amphetamines: NOT DETECTED
Barbiturates: NOT DETECTED
Benzodiazepines: NOT DETECTED
Cocaine: NOT DETECTED
Opiates: POSITIVE — AB
Tetrahydrocannabinol: NOT DETECTED

## 2024-10-06 MED ORDER — OXYCODONE HCL 5 MG PO TABS
5.0000 mg | ORAL_TABLET | ORAL | Status: DC | PRN
  Administered 2024-10-06 – 2024-10-08 (×5): 5 mg via ORAL
  Administered 2024-10-09 (×4): 10 mg via ORAL
  Administered 2024-10-10: 5 mg via ORAL
  Administered 2024-10-10: 10 mg via ORAL
  Administered 2024-10-10: 5 mg via ORAL
  Administered 2024-10-10: 10 mg via ORAL
  Administered 2024-10-11 (×2): 5 mg via ORAL
  Administered 2024-10-12 (×2): 10 mg via ORAL
  Filled 2024-10-06: qty 1
  Filled 2024-10-06 (×3): qty 2
  Filled 2024-10-06: qty 1
  Filled 2024-10-06: qty 2
  Filled 2024-10-06: qty 1
  Filled 2024-10-06 (×4): qty 2
  Filled 2024-10-06 (×3): qty 1
  Filled 2024-10-06 (×2): qty 2
  Filled 2024-10-06 (×2): qty 1

## 2024-10-06 MED ORDER — ALBUTEROL SULFATE (2.5 MG/3ML) 0.083% IN NEBU
2.5000 mg | INHALATION_SOLUTION | Freq: Four times a day (QID) | RESPIRATORY_TRACT | Status: AC
  Administered 2024-10-06 (×2): 2.5 mg via RESPIRATORY_TRACT
  Filled 2024-10-06 (×4): qty 3

## 2024-10-06 MED ORDER — HYDROMORPHONE HCL 1 MG/ML IJ SOLN
0.5000 mg | INTRAMUSCULAR | Status: DC | PRN
  Administered 2024-10-06 – 2024-10-09 (×4): 0.5 mg via INTRAVENOUS
  Filled 2024-10-06: qty 1
  Filled 2024-10-06 (×3): qty 0.5

## 2024-10-06 NOTE — Plan of Care (Signed)

## 2024-10-06 NOTE — Progress Notes (Signed)
 Transition of Care [facility]) - CAGE-AID Screening   Patient Details  Name: Brandon Frye MRN: [id number] Date of Birth: [birth year]  MARINDA LIONEL Sora, RN Phone Number: 10/06/2024, 7:36 AM   Clinical Narrative: Pt reports 10 cigarettes daily Pt reports etoh occasionally-97 today Pt reports THC usage   CAGE-AID Screening:    Have You Ever Felt You Ought to Cut Down on Your Drinking or Drug Use?: Yes Have People Annoyed You By Critizing Your Drinking Or Drug Use?: No Have You Felt Bad Or Guilty About Your Drinking Or Drug Use?: No Have You Ever Had a Drink or Used Drugs First Thing In The Morning to Steady Your Nerves or to Get Rid of a Hangover?: No CAGE-AID Score: 1

## 2024-10-06 NOTE — Discharge Instructions (Addendum)
 Reach out to your [facility] Social Worker about additional services available- Brandon Frye. [phone] ext [phone]   In a time of Crisis: [facility]  Mobile Crisis Management provides immediate crisis response, 24/7.  Call [phone]  [facility] for MH/DD/SA [facility] is available 24 hours a day, 7 days a week. Customer Service Specialists will assist you to find a crisis provider that is well-matched with your needs. Your local number is: [phone]  [facility] ([facility]) IOP, individual counseling, medication management [address] [place], [place] [address] [phone] Call for intake hours; Medicaid and Uninsured    Substance Use Outpatient Providers  [facility] ([facility]) Group and individual counseling. [address]  [place], [place] [address] [phone] [place]: [phone]  [place]: [phone] Medicaid and uninsured.   [facility] Offers IOP groups multiple times per week. [address] [place], [place] [address] [phone] Takes Medicaid and other insurances.   [facility]  Chemical Dependency Intensive Outpatient Program (IOP) [address] [place], [place] [address] [phone] Takes nurse, learning disability and [place].   [facility]  IOP and Partial Hospitalization Program  [address]  [place], [place] [address] [phone] Private Insurance, [place] only for partial hospitalization  [facility] [address], [place], [place] [address] [phone] Monday-Friday. Short and Long term options.  [facility] ([facility]) IOP, individual counseling, medication management [address] [place], [place] [address] [phone] Medicaid and [facility]  [facility] [address]   [place], [place] [address] [phone] Private Insurance and Self Pay   [facility] Outpatient [address]  [place], [place] [address] [phone] Private Insurance, [place], and Self Pay   [facility]  [address] [place], [place] [address] [facility]  [address]  [place], [place] [address] [phone]  [facility]  [address] [place], [place] [address] [phone]      Residential Treatment Programs  [facility] ([facility] [address] [place], [place] [address] [phone] or [phone] Detox and Residential Rehab 21 days (Medicaid, private insurance, and self pay. If Medicare, will look into funding). No methadone. Call for pre-screen.   [facility]) [address]  [place], [place] [address] [phone] Detox 3-7 days (self Pay and Medicaid Limited availability). Transitional Program for females needs 60 days clean first.  Rehab Only for Males (Medicare, Medicaid, and Self Pay)-No methadone.  [facility] [address] [place], [place] [address] [phone] or [phone] Private Insurance only  [facility] PHONE: [phone] FAX: [phone] Residential program for women 21 and over for up to a year through a Christian 12-step recovery model. Self-pay.    [facility] [address] [place], [place] [address] Phone:  [phone] Must be detoxed 72 hours prior to admission; 28 day program.  Self-pay.  [facility] [address]  [place], [place] [phone] [employer], Medicare, [place] (not straight [place]). They offer assistance with transportation.   [facility] [address] [place],  [place], [place] [address] [phone] Christian Based Program. Men only. No insurance  [employer] is a substance use disorder treatment program for women,  including those who are pregnant, parenting, and/or whose lives  have been touched by abuse and violence. [phone]) [phone]  [facility] [address] [place], [place] [address] Women's: [phone] Men's: [phone] No Medicaid.   [facility] Locations across the [place] (mainly [place] ) willing to help with transportation.  [phone] [employer]. [facility]  [address]  [place], [place] [address] [phone] Treatment Only, must make assessment appointment, and must be sober for assessment appointment. Self pay, [facility], must be [facility] resident. No methadone.   [facility]  [address] [place], [place] [address] [phone] No pending legal charges, Long-term work program. No methadone. Call for assessment.  [facility]  [address], [place], [place] [address] [phone] or [phone] Commercial Insurance Only  [facility] - [phone] Private Insurance (no [place]). Males/Females, call to make referrals, multiple facilities.   [facility]: [facility] [address] [place], [place] [address] [phone]  [facility]-no methadone: [facility] [address] [place], [place] [address] [phone] darien Ab[facility] [phone] [place], [place] For women, houses 8 residents for sober living. No Medicaid.         AA Meetings Website to locate meetings (virtually or in person): [email or web address].[email or web address].biz/ Phone: [phone]  Syringe Services Program: Due to COVID-19, syringe services programs are likely operating under different hours with limited or no fixed site hours. Some programs may not be operating at all. Please contact the program directly using the phone numbers provided below to see if  they are still operating under COVID-19. [facility] Solution to the Opioid Problem (GCSTOP) Fixed; mobile; peer-based;Norman Cummins) [phone] [email or web address] Fixed site exchange at [facility], [address] [place], [place] [address] on Wednesdays (2:00 - 5:00 pm) and Thursdays (4:00 - 8:00 pm). Pop-up mobile exchange locations: [employer], [address] [place], [place] [address] on Tuesdays (11:00 am - 1:00 pm) and Fridays (11:00 am - 1:00 pm) -[facility] - [address], [place], [place] [address] on Tuesdays (2:00 - 4:00 pm) and Fridays (2:00 - 4:00 pm) -[facility] - also serves Radio Broadcast Assistant and [employer] [place] [employer];Fixed; mobile; peer-based; Velia Specking [phone] [email or web address] [address], [place], [place] [address] Delivery and outreach available in [place], please call for more information. Monday, Tuesday: 1:00 -7:00 pm, Thursday: 4:00 pm - 8:00 pm, Friday: 1:00 pm - 8:00 pm)  Medication-Assisted Treatment (MAT):  -[facility]- services [address] and surrounding areas including [place], [place], [place], [place], [address], [place], [place], [place], [place], and [place], [place]. Options include Methadone, buprenorphine or Suboxone. [address] [place], [place] [address] Phone: [phone] Mon - Fri: 5:30am - 2:00pm Sat: 5:30am -7:30am Sun: Closed Holidays: 6:00am - 8:00am  -[facility]- We use FDA-approved medications, like methadone/suboxone/sublocade, and vivitrol. These medications are then combined with customized care plans that include individual or group counseling, toxicology, and medical care directed by on-site physicians. Accepts most insurance plans, Medicaid, and private pay.  [address] [place], [place] [address] Phone: [phone] Monday-Friday 5:00 AM - 10:00 AM Saturday 6:00  AM - 8:30 AM Sunday 6:00 AM - 7:00 AM  -[facility]- [facility] is a treatment & recovery focused program. In addition to receiving methadone medication, our clients participate in individual and group counseling as well as random drug testing.  If accepted into the [facility] Opioid Program, you will be provided several intake appointments and a physical exam [address] [place], [place] [address] Office: [phone]  Fax: [phone]  -[facility]- We put our community members at the center of everything we do, for remote treatment services as well as in-person, from alcohol withdrawal to opioid use and more.  [address], [place], [place] [address] [phone] Monday-Wednesday: 9:00am - 5:00pm Thursday: 9:00am - 6:00pm Friday: 9:00am - 5:00pm Saturday: 9:00am - 1:00pm Sunday: Closed  -[facility] [place]) [address], [place], [place] [address] [phone]  [place] [phone] [address] [place], [place] [address]  M-W    5:00am-12:00pm Thu     5:00am-10:00am Fri       5:00am-12:00pm Sat      5:00am-8:00am Sun     Closed  $12/daily for Methadone Treatment.
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
## 2024-10-06 NOTE — Progress Notes (Signed)
 Echocardiogram 2D Echocardiogram has been performed.  Brandon Frye 10/06/2024, 10:32 AM

## 2024-10-06 NOTE — ED Notes (Signed)
 Patient brought to 4E from ED. Telemetry box applied, CCMD notified. Patient oriented to room and staff. Call bell in reach.   10/06/24 1532  Vitals  Temp 98.5 F (36.9 C)  Temp Source Oral  BP 115/75  MAP (mmHg) 88  BP Location Left Arm  BP Method Automatic  Patient Position (if appropriate) Lying  Pulse Rate 68  Pulse Rate Source Monitor  ECG Heart Rate 68  Resp 18  Level of Consciousness  Level of Consciousness Alert  Oxygen Therapy  SpO2 94 %  O2 Device Nasal Cannula  O2 Flow Rate (L/min) 3 L/min
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
## 2024-10-07 ENCOUNTER — Inpatient Hospital Stay (HOSPITAL_COMMUNITY)

## 2024-10-07 LAB — BASIC METABOLIC PANEL WITH GFR
Anion gap: 12 (ref 5–15)
Anion gap: 9 (ref 5–15)
Anion gap: 7 (ref 5–15)
BUN: 41 mg/dL — ABNORMAL HIGH (ref 8–23)
BUN: 43 mg/dL — ABNORMAL HIGH (ref 8–23)
BUN: 46 mg/dL — ABNORMAL HIGH (ref 8–23)
CO2: 25 mmol/L (ref 22–32)
CO2: 28 mmol/L (ref 22–32)
CO2: 30 mmol/L (ref 22–32)
Calcium: 8.7 mg/dL — ABNORMAL LOW (ref 8.9–10.3)
Calcium: 8.9 mg/dL (ref 8.9–10.3)
Calcium: 8.6 mg/dL — ABNORMAL LOW (ref 8.9–10.3)
Chloride: 94 mmol/L — ABNORMAL LOW (ref 98–111)
Chloride: 96 mmol/L — ABNORMAL LOW (ref 98–111)
Chloride: 94 mmol/L — ABNORMAL LOW (ref 98–111)
Creatinine, Ser: 3.21 mg/dL — ABNORMAL HIGH (ref 0.61–1.24)
Creatinine, Ser: 4.16 mg/dL — ABNORMAL HIGH (ref 0.61–1.24)
Creatinine, Ser: 4.28 mg/dL — ABNORMAL HIGH (ref 0.61–1.24)
GFR, Estimated: 19 mL/min — ABNORMAL LOW (ref >60)
GFR, Estimated: 14 mL/min — ABNORMAL LOW (ref >60)
GFR, Estimated: 14 mL/min — ABNORMAL LOW (ref >60)
Glucose, Bld: 111 mg/dL — ABNORMAL HIGH (ref 70–99)
Glucose, Bld: 124 mg/dL — ABNORMAL HIGH (ref 70–99)
Glucose, Bld: 118 mg/dL — ABNORMAL HIGH (ref 70–99)
Potassium: 5.4 mmol/L — ABNORMAL HIGH (ref 3.5–5.1)
Potassium: 5 mmol/L (ref 3.5–5.1)
Potassium: 4.7 mmol/L (ref 3.5–5.1)
Sodium: 131 mmol/L — ABNORMAL LOW (ref 135–145)
Sodium: 133 mmol/L — ABNORMAL LOW (ref 135–145)
Sodium: 131 mmol/L — ABNORMAL LOW (ref 135–145)

## 2024-10-07 LAB — CK: Total CK: 647 U/L — ABNORMAL HIGH (ref 49–397)

## 2024-10-07 LAB — MAGNESIUM: Magnesium: 1.8 mg/dL (ref 1.7–2.4)

## 2024-10-07 LAB — CBC
HCT: 30.9 % — ABNORMAL LOW (ref 39.0–52.0)
Hemoglobin: 10.1 g/dL — ABNORMAL LOW (ref 13.0–17.0)
MCH: 29.3 pg (ref 26.0–34.0)
MCHC: 32.7 g/dL (ref 30.0–36.0)
MCV: 89.6 fL (ref 80.0–100.0)
Platelets: 76 K/uL — ABNORMAL LOW (ref 150–400)
RBC: 3.45 MIL/uL — ABNORMAL LOW (ref 4.22–5.81)
RDW: 14.1 % (ref 11.5–15.5)
WBC: 7.8 K/uL (ref 4.0–10.5)
nRBC: 0 % (ref 0.0–0.2)

## 2024-10-07 LAB — BLOOD GAS, ARTERIAL
Acid-Base Excess: 3.4 mmol/L — ABNORMAL HIGH (ref 0.0–2.0)
Bicarbonate: 30.4 mmol/L — ABNORMAL HIGH (ref 20.0–28.0)
Drawn by: 24610
Patient temperature: 36.9
pCO2 arterial: 55 mmHg — ABNORMAL HIGH (ref 32–48)
pH, Arterial: 7.35 (ref 7.35–7.45)
pO2, Arterial: 61 mmHg — ABNORMAL LOW (ref 83–108)

## 2024-10-07 LAB — SODIUM, URINE, RANDOM: Sodium, Ur: <30 mmol/L

## 2024-10-07 LAB — MRSA NEXT GEN BY PCR, NASAL: MRSA by PCR Next Gen: NOT DETECTED

## 2024-10-07 LAB — BRAIN NATRIURETIC PEPTIDE: B Natriuretic Peptide: 53.7 pg/mL (ref 0.0–100.0)

## 2024-10-07 LAB — CREATININE, URINE, RANDOM: Creatinine, Urine: 240 mg/dL

## 2024-10-07 LAB — PHOSPHORUS: Phosphorus: 5.7 mg/dL — ABNORMAL HIGH (ref 2.5–4.6)

## 2024-10-07 MED ORDER — ALBUTEROL SULFATE (2.5 MG/3ML) 0.083% IN NEBU
INHALATION_SOLUTION | RESPIRATORY_TRACT | Status: AC
  Administered 2024-10-07: 2.5 mg
  Filled 2024-10-07: qty 3

## 2024-10-07 MED ORDER — SODIUM CHLORIDE 0.9 % IV BOLUS: 1000.0000 mL | Freq: Once | INTRAVENOUS | Status: DC

## 2024-10-07 MED ORDER — SODIUM ZIRCONIUM CYCLOSILICATE 10 G PO PACK
10.0000 g | PACK | Freq: Once | ORAL | Status: AC
  Administered 2024-10-07: 10 g via ORAL
  Filled 2024-10-07: qty 1

## 2024-10-07 MED ORDER — FUROSEMIDE 10 MG/ML IJ SOLN
80.0000 mg | Freq: Once | INTRAMUSCULAR | Status: AC
  Administered 2024-10-07: 80 mg via INTRAVENOUS
  Filled 2024-10-07: qty 8

## 2024-10-07 MED ORDER — ALBUTEROL SULFATE (2.5 MG/3ML) 0.083% IN NEBU
2.5000 mg | INHALATION_SOLUTION | RESPIRATORY_TRACT | Status: DC | PRN
  Administered 2024-10-07: 2.5 mg via RESPIRATORY_TRACT
  Filled 2024-10-07: qty 3

## 2024-10-07 MED ORDER — LORAZEPAM 2 MG/ML IJ SOLN
1.0000 mg | INTRAMUSCULAR | Status: DC | PRN
  Administered 2024-10-07: 2 mg via INTRAVENOUS
  Filled 2024-10-07: qty 1

## 2024-10-07 MED ORDER — SODIUM CHLORIDE 0.9 % IV SOLN
2.0000 g | INTRAVENOUS | Status: DC
  Administered 2024-10-07: 2 g via INTRAVENOUS
  Filled 2024-10-07: qty 12.5

## 2024-10-07 MED ORDER — PHENOBARBITAL 32.4 MG PO TABS
64.8000 mg | ORAL_TABLET | Freq: Three times a day (TID) | ORAL | Status: AC
  Administered 2024-10-08 – 2024-10-09 (×4): 64.8 mg via ORAL
  Filled 2024-10-07 (×4): qty 2

## 2024-10-07 MED ORDER — BUDESONIDE 0.25 MG/2ML IN SUSP
0.2500 mg | Freq: Two times a day (BID) | RESPIRATORY_TRACT | Status: DC
  Administered 2024-10-07: 0.25 mg via RESPIRATORY_TRACT
  Filled 2024-10-07 (×2): qty 2

## 2024-10-07 MED ORDER — METHYLPREDNISOLONE SODIUM SUCC 40 MG IJ SOLR
40.0000 mg | Freq: Once | INTRAMUSCULAR | Status: AC
  Administered 2024-10-07: 40 mg via INTRAVENOUS
  Filled 2024-10-07: qty 1

## 2024-10-07 MED ORDER — PHENOBARBITAL 32.4 MG PO TABS
32.4000 mg | ORAL_TABLET | Freq: Three times a day (TID) | ORAL | Status: AC
  Administered 2024-10-09 – 2024-10-11 (×5): 32.4 mg via ORAL
  Filled 2024-10-07 (×6): qty 1

## 2024-10-07 MED ORDER — IPRATROPIUM-ALBUTEROL 0.5-2.5 (3) MG/3ML IN SOLN
3.0000 mL | Freq: Four times a day (QID) | RESPIRATORY_TRACT | Status: DC
  Administered 2024-10-07 – 2024-10-08 (×3): 3 mL via RESPIRATORY_TRACT
  Filled 2024-10-07 (×5): qty 3

## 2024-10-07 MED ORDER — ALBUTEROL SULFATE (2.5 MG/3ML) 0.083% IN NEBU: 2.5000 mg | INHALATION_SOLUTION | RESPIRATORY_TRACT | Status: DC | PRN

## 2024-10-07 MED ORDER — CALCIUM GLUCONATE-NACL 1-0.675 GM/50ML-% IV SOLN
1.0000 g | Freq: Once | INTRAVENOUS | Status: AC
  Administered 2024-10-07: 1000 mg via INTRAVENOUS
  Filled 2024-10-07: qty 50

## 2024-10-07 MED ORDER — FUROSEMIDE 10 MG/ML IJ SOLN
20.0000 mg | Freq: Once | INTRAMUSCULAR | Status: AC
  Administered 2024-10-07: 20 mg via INTRAVENOUS
  Filled 2024-10-07: qty 2

## 2024-10-07 MED ORDER — CHLORHEXIDINE GLUCONATE CLOTH 2 % EX PADS
6.0000 | MEDICATED_PAD | Freq: Every day | CUTANEOUS | Status: DC
  Administered 2024-10-07 – 2024-10-10 (×3): 6 via TOPICAL

## 2024-10-07 NOTE — Progress Notes (Signed)
 Trauma Event Note   Rounding on pt to complete [facility] -- Primary RN and NT finishing bladder scan- no urine retention noted- Pt is comfortable, short of breath with talking, O2 on at 3L/M/Bajandas-- sounds wet with breathing--   Dr. Paola at bedside as this TRN was leaving.   Admits to drinking ETOH, but only a couple times a week smokes marijuana and cigarettes. No other illicit drug use disclosed.   Last imported Vital Signs BP 98/60 (BP Location: Left Arm)   Pulse 95   Temp 98.7 F (37.1 C) (Oral)   Resp 17   Ht 6' 1 (1.854 m)   Wt 195 lb (88.5 kg)   SpO2 96%   BMI 25.73 kg/m   Trending CBC Recent Labs    10/05/24 1726 10/05/24 1732 10/06/24 0253 10/07/24 0310  WBC 5.8  --  7.9 7.8  HGB 10.3* 11.9* 10.2* 10.1*  HCT 31.6* 35.0* 31.7* 30.9*  PLT 163  --  141* 76*    Trending Coag's Recent Labs    10/05/24 1726 10/06/24 0253  INR 1.5* 1.1    Trending BMET Recent Labs    10/05/24 1726 10/05/24 1732 10/06/24 0253 10/07/24 0310  NA 137 137 137 131*  K 5.2* 4.8 5.1 5.4*  CL 99 100 101 94*  CO2 27  --  28 25  BUN 28* 39* 28* 41*  CREATININE 2.14* 2.40* 2.23* 3.21*  GLUCOSE 116* 111* 82 111*      Arul Farabee M Diego Delancey  Trauma Response RN  Please call TRN at [phone] for further assistance.
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
## 2024-10-07 NOTE — Plan of Care (Signed)
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
## 2024-10-07 NOTE — Progress Notes (Signed)
 Mobility Specialist Progress Note:    10/07/24 1500  Mobility  Activity Stood at bedside  Level of Assistance Contact guard assist, steadying assist  Assistive Device Other (Comment) (HHA)  Distance Ambulated (ft) 2 ft  Activity Response Tolerated fair  Mobility Referral Yes  Mobility visit 1 Mobility  Mobility Specialist Start Time (ACUTE ONLY) 1405  Mobility Specialist Stop Time (ACUTE ONLY) 1420  Mobility Specialist Time Calculation (min) (ACUTE ONLY) 15 min   Pt received standing at EOB leaning over messing with gown. Pt was mildly disoriented stating they just brought me up here and just left me!, needing min multimodal cues for redirection. Was able to get patient back to supine in bed w/ MinA. Call bell and personal belongings in reach. All needs met. Bed alarm on. RN aware.   Thersia Minder Mobility Specialist  Please contact vis Secure Chat or  Rehab Office [phone]
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
## 2024-10-07 NOTE — Progress Notes (Signed)
 Renal consulted and recs to hold off on fluid challenge. Awaiting recs. Suspected aspiration PNA on CXR, start empiric cefepime , resp cx P. Txf to ICU for closer monitoring.   Brandon GEANNIE Hanger, MD General and Trauma Surgery [facility] Surgery

## 2024-10-08 ENCOUNTER — Inpatient Hospital Stay (HOSPITAL_COMMUNITY)

## 2024-10-08 LAB — EXPECTORATED SPUTUM ASSESSMENT W GRAM STAIN, RFLX TO RESP C

## 2024-10-08 LAB — CBC
HCT: 29.2 % — ABNORMAL LOW (ref 39.0–52.0)
Hemoglobin: 9.5 g/dL — ABNORMAL LOW (ref 13.0–17.0)
MCH: 29.2 pg (ref 26.0–34.0)
MCHC: 32.5 g/dL (ref 30.0–36.0)
MCV: 89.8 fL (ref 80.0–100.0)
Platelets: 121 K/uL — ABNORMAL LOW (ref 150–400)
RBC: 3.25 MIL/uL — ABNORMAL LOW (ref 4.22–5.81)
RDW: 13.7 % (ref 11.5–15.5)
WBC: 10.6 K/uL — ABNORMAL HIGH (ref 4.0–10.5)
nRBC: 0 % (ref 0.0–0.2)

## 2024-10-08 LAB — FERRITIN: Ferritin: 74 ng/mL (ref 24–336)

## 2024-10-08 LAB — PHOSPHORUS: Phosphorus: 6.1 mg/dL — ABNORMAL HIGH (ref 2.5–4.6)

## 2024-10-08 LAB — RENAL FUNCTION PANEL
Albumin: 3.2 g/dL — ABNORMAL LOW (ref 3.5–5.0)
Anion gap: 14 (ref 5–15)
BUN: 48 mg/dL — ABNORMAL HIGH (ref 8–23)
CO2: 24 mmol/L (ref 22–32)
Calcium: 8.9 mg/dL (ref 8.9–10.3)
Chloride: 93 mmol/L — ABNORMAL LOW (ref 98–111)
Creatinine, Ser: 3.55 mg/dL — ABNORMAL HIGH (ref 0.61–1.24)
GFR, Estimated: 17 mL/min — ABNORMAL LOW (ref >60)
Glucose, Bld: 139 mg/dL — ABNORMAL HIGH (ref 70–99)
Phosphorus: 6.1 mg/dL — ABNORMAL HIGH (ref 2.5–4.6)
Potassium: 5.5 mmol/L — ABNORMAL HIGH (ref 3.5–5.1)
Sodium: 131 mmol/L — ABNORMAL LOW (ref 135–145)

## 2024-10-08 LAB — POCT I-STAT 7, (LYTES, BLD GAS, ICA,H+H)
Acid-Base Excess: 1 mmol/L (ref 0.0–2.0)
Bicarbonate: 27.3 mmol/L (ref 20.0–28.0)
Calcium, Ion: 1.23 mmol/L (ref 1.15–1.40)
HCT: 26 % — ABNORMAL LOW (ref 39.0–52.0)
Hemoglobin: 8.8 g/dL — ABNORMAL LOW (ref 13.0–17.0)
O2 Saturation: 86 %
Patient temperature: 98.3
Potassium: 5.5 mmol/L — ABNORMAL HIGH (ref 3.5–5.1)
Sodium: 129 mmol/L — ABNORMAL LOW (ref 135–145)
TCO2: 29 mmol/L (ref 22–32)
pCO2 arterial: 52.6 mmHg — ABNORMAL HIGH (ref 32–48)
pH, Arterial: 7.322 — ABNORMAL LOW (ref 7.35–7.45)
pO2, Arterial: 57 mmHg — ABNORMAL LOW (ref 83–108)

## 2024-10-08 LAB — IRON AND TIBC
Iron: 13 ug/dL — ABNORMAL LOW (ref 45–182)
Saturation Ratios: 4 % — ABNORMAL LOW (ref 17.9–39.5)
TIBC: 295 ug/dL (ref 250–450)
UIBC: 282 ug/dL

## 2024-10-08 LAB — MAGNESIUM: Magnesium: 1.9 mg/dL (ref 1.7–2.4)

## 2024-10-08 MED ORDER — SODIUM CHLORIDE 0.9 % IV SOLN
1.0000 g | INTRAVENOUS | Status: DC
  Administered 2024-10-08 – 2024-10-10 (×3): 1 g via INTRAVENOUS
  Filled 2024-10-08 (×4): qty 10

## 2024-10-08 MED ORDER — FLUTICASONE FUROATE-VILANTEROL 200-25 MCG/ACT IN AEPB
1.0000 | INHALATION_SPRAY | Freq: Every day | RESPIRATORY_TRACT | Status: DC
  Administered 2024-10-09 – 2024-10-12 (×4): 1 via RESPIRATORY_TRACT
  Filled 2024-10-08: qty 28

## 2024-10-08 MED ORDER — SODIUM ZIRCONIUM CYCLOSILICATE 10 G PO PACK: 10.0000 g | PACK | Freq: Once | ORAL | Status: DC

## 2024-10-08 MED ORDER — SODIUM ZIRCONIUM CYCLOSILICATE 10 G PO PACK: 10.0000 g | PACK | Freq: Two times a day (BID) | ORAL | Status: DC

## 2024-10-08 MED ORDER — SODIUM BICARBONATE 8.4 % IV SOLN
50.0000 meq | Freq: Once | INTRAVENOUS | Status: AC
  Administered 2024-10-08: 50 meq via INTRAVENOUS
  Filled 2024-10-08: qty 50

## 2024-10-08 MED ORDER — SODIUM ZIRCONIUM CYCLOSILICATE 10 G PO PACK
10.0000 g | PACK | Freq: Two times a day (BID) | ORAL | Status: AC
  Administered 2024-10-08 – 2024-10-09 (×4): 10 g via ORAL
  Filled 2024-10-08 (×4): qty 1

## 2024-10-08 MED ORDER — IPRATROPIUM-ALBUTEROL 0.5-2.5 (3) MG/3ML IN SOLN
3.0000 mL | RESPIRATORY_TRACT | Status: DC | PRN
  Administered 2024-10-09 – 2024-10-11 (×3): 3 mL via RESPIRATORY_TRACT
  Filled 2024-10-08 (×4): qty 3
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
## 2024-10-08 NOTE — Plan of Care (Signed)
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
## 2024-10-09 ENCOUNTER — Inpatient Hospital Stay (HOSPITAL_COMMUNITY)

## 2024-10-09 LAB — EXPECTORATED SPUTUM ASSESSMENT W GRAM STAIN, RFLX TO RESP C

## 2024-10-09 LAB — CBC
HCT: 25 % — ABNORMAL LOW (ref 39.0–52.0)
Hemoglobin: 8.4 g/dL — ABNORMAL LOW (ref 13.0–17.0)
MCH: 29.5 pg (ref 26.0–34.0)
MCHC: 33.6 g/dL (ref 30.0–36.0)
MCV: 87.7 fL (ref 80.0–100.0)
Platelets: 156 K/uL (ref 150–400)
RBC: 2.85 MIL/uL — ABNORMAL LOW (ref 4.22–5.81)
RDW: 13.6 % (ref 11.5–15.5)
WBC: 10.3 K/uL (ref 4.0–10.5)
nRBC: 0 % (ref 0.0–0.2)

## 2024-10-09 LAB — BASIC METABOLIC PANEL WITH GFR
Anion gap: 8 (ref 5–15)
BUN: 46 mg/dL — ABNORMAL HIGH (ref 8–23)
CO2: 28 mmol/L (ref 22–32)
Calcium: 8.9 mg/dL (ref 8.9–10.3)
Chloride: 97 mmol/L — ABNORMAL LOW (ref 98–111)
Creatinine, Ser: 2.35 mg/dL — ABNORMAL HIGH (ref 0.61–1.24)
GFR, Estimated: 28 mL/min — ABNORMAL LOW (ref >60)
Glucose, Bld: 151 mg/dL — ABNORMAL HIGH (ref 70–99)
Potassium: 4.9 mmol/L (ref 3.5–5.1)
Sodium: 133 mmol/L — ABNORMAL LOW (ref 135–145)

## 2024-10-09 LAB — ANA W/REFLEX IF POSITIVE: Anti Nuclear Antibody (ANA): NEGATIVE

## 2024-10-09 MED ORDER — DEXMEDETOMIDINE HCL IN NACL 400 MCG/100ML IV SOLN
0.0000 ug/kg/h | INTRAVENOUS | Status: DC
  Administered 2024-10-09: 0.4 ug/kg/h via INTRAVENOUS
  Administered 2024-10-10: 0.7 ug/kg/h via INTRAVENOUS
  Filled 2024-10-09 (×2): qty 100

## 2024-10-09 MED ORDER — HALOPERIDOL LACTATE 5 MG/ML IJ SOLN
10.0000 mg | Freq: Once | INTRAMUSCULAR | Status: AC
  Administered 2024-10-09: 10 mg via INTRAVENOUS
  Filled 2024-10-09: qty 2

## 2024-10-09 MED ORDER — LORAZEPAM 2 MG/ML IJ SOLN
2.0000 mg | Freq: Once | INTRAMUSCULAR | Status: AC
  Administered 2024-10-09: 2 mg via INTRAVENOUS
  Filled 2024-10-09 (×2): qty 1

## 2024-10-09 MED ORDER — SODIUM CHLORIDE 0.9 % IV SOLN
200.0000 mg | Freq: Once | INTRAVENOUS | Status: AC
  Administered 2024-10-09: 200 mg via INTRAVENOUS
  Filled 2024-10-09: qty 10

## 2024-10-09 MED ORDER — LORAZEPAM 2 MG/ML IJ SOLN
2.0000 mg | Freq: Once | INTRAMUSCULAR | Status: AC
  Administered 2024-10-09: 2 mg via INTRAVENOUS
  Filled 2024-10-09: qty 1
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
## 2024-10-09 NOTE — Progress Notes (Signed)
 OT Cancellation Note  Patient Details Name: Brandon Frye MRN: [id number] DOB: 1946-08-13   Cancelled Treatment:    Reason Eval/Treat Not Completed: Medical issues which prohibited therapy Pt with increasing agitation and restlessness. Now in 4 point restraints. OT will hold any attempts today and follow up tomorrow for OT eval.  Mliss Fish 10/09/2024, 12:38 PM
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
## 2024-10-09 NOTE — Progress Notes (Signed)
 Family at bedside and asking for restraints to be removed. Pt appears slightly calmer and following commands. Ankle restraints removed at this time. Will continue to reassess to determine readiness for wrist restraints and lap belt removal.

## 2024-10-09 NOTE — Plan of Care (Signed)
  Problem: Safety: Goal: Non-violent Restraint(s) Outcome: Progressing   Problem: Health Behavior/Discharge Planning: Goal: Ability to manage health-related needs will improve Outcome: Not Progressing   Problem: Clinical Measurements: Goal: Respiratory complications will improve Outcome: Not Progressing   Problem: Coping: Goal: Level of anxiety will decrease Outcome: Not Progressing
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
## 2024-10-09 NOTE — Plan of Care (Signed)
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
## 2024-10-10 LAB — BASIC METABOLIC PANEL WITH GFR
Anion gap: 12 (ref 5–15)
BUN: 29 mg/dL — ABNORMAL HIGH (ref 8–23)
CO2: 29 mmol/L (ref 22–32)
Calcium: 9.4 mg/dL (ref 8.9–10.3)
Chloride: 98 mmol/L (ref 98–111)
Creatinine, Ser: 1.47 mg/dL — ABNORMAL HIGH (ref 0.61–1.24)
GFR, Estimated: 49 mL/min — ABNORMAL LOW (ref >60)
Glucose, Bld: 102 mg/dL — ABNORMAL HIGH (ref 70–99)
Potassium: 4.6 mmol/L (ref 3.5–5.1)
Sodium: 139 mmol/L (ref 135–145)

## 2024-10-10 LAB — CBC
HCT: 28.3 % — ABNORMAL LOW (ref 39.0–52.0)
Hemoglobin: 9.2 g/dL — ABNORMAL LOW (ref 13.0–17.0)
MCH: 28.7 pg (ref 26.0–34.0)
MCHC: 32.5 g/dL (ref 30.0–36.0)
MCV: 88.2 fL (ref 80.0–100.0)
Platelets: 191 K/uL (ref 150–400)
RBC: 3.21 MIL/uL — ABNORMAL LOW (ref 4.22–5.81)
RDW: 13.6 % (ref 11.5–15.5)
WBC: 7.4 K/uL (ref 4.0–10.5)
nRBC: 0 % (ref 0.0–0.2)

## 2024-10-10 LAB — C3 COMPLEMENT: C3 Complement: 122 mg/dL (ref 82–167)

## 2024-10-10 LAB — C4 COMPLEMENT: Complement C4, Body Fluid: 31 mg/dL (ref 12–38)

## 2024-10-10 MED ORDER — BISACODYL 5 MG PO TBEC
10.0000 mg | DELAYED_RELEASE_TABLET | Freq: Once | ORAL | Status: AC
  Administered 2024-10-10: 10 mg via ORAL
  Filled 2024-10-10: qty 2

## 2024-10-10 MED ORDER — ENOXAPARIN SODIUM 60 MG/0.6ML IJ SOSY
60.0000 mg | PREFILLED_SYRINGE | Freq: Two times a day (BID) | INTRAMUSCULAR | Status: DC
  Administered 2024-10-10 – 2024-10-12 (×5): 60 mg via SUBCUTANEOUS
  Filled 2024-10-10 (×6): qty 0.6

## 2024-10-10 MED ORDER — SENNA 8.6 MG PO TABS
2.0000 | ORAL_TABLET | Freq: Once | ORAL | Status: AC
  Administered 2024-10-10: 17.2 mg via ORAL
  Filled 2024-10-10: qty 2

## 2024-10-10 MED ORDER — BISACODYL 10 MG RE SUPP
10.0000 mg | Freq: Once | RECTAL | Status: DC
  Filled 2024-10-10: qty 1

## 2024-10-10 MED ORDER — LACTULOSE 10 GM/15ML PO SOLN
30.0000 g | Freq: Once | ORAL | Status: AC
  Administered 2024-10-10: 30 g via ORAL
  Filled 2024-10-10: qty 45
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
## 2024-10-10 NOTE — Progress Notes (Signed)
 Noted several runs of ventricular bigeminy overnight when reviewing cardiac strips. Dr. Paola aware.

## 2024-10-10 NOTE — Progress Notes (Signed)
 SATURATION QUALIFICATIONS: (This note is used to comply with regulatory documentation for home oxygen)  Patient Saturations on Room Air at Rest = 87%  Patient Saturations on 1L at rest = 92%  Patient Saturations on 2 Liters of oxygen while Ambulating = 92%  Please briefly explain why patient needs home oxygen: Pt requires 1L at rest and 2L with gait to maintain SPO2 >89%  Shylo Zamor P, PT Acute Rehabilitation Services Office: [phone]
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
## 2024-10-10 NOTE — Plan of Care (Signed)
  Problem: Education: Goal: Knowledge of General Education information will improve Description: Including pain rating scale, medication(s)/side effects and non-pharmacologic comfort measures Outcome: Progressing   Problem: Health Behavior/Discharge Planning: Goal: Ability to manage health-related needs will improve Outcome: Progressing   Problem: Clinical Measurements: Goal: Ability to maintain clinical measurements within normal limits will improve Outcome: Progressing Goal: Will remain free from infection Outcome: Progressing Goal: Diagnostic test results will improve Outcome: Progressing Goal: Respiratory complications will improve Outcome: Progressing Goal: Cardiovascular complication will be avoided Outcome: Progressing   Problem: Activity: Goal: Risk for activity intolerance will decrease Outcome: Progressing   Problem: Nutrition: Goal: Adequate nutrition will be maintained Outcome: Progressing   Problem: Elimination: Goal: Will not experience complications related to bowel motility Outcome: Progressing Goal: Will not experience complications related to urinary retention Outcome: Progressing   Problem: Pain Managment: Goal: General experience of comfort will improve and/or be controlled Outcome: Progressing   Problem: Safety: Goal: Ability to remain free from injury will improve Outcome: Progressing   Problem: Skin Integrity: Goal: Risk for impaired skin integrity will decrease Outcome: Progressing   Problem: Safety: Goal: Non-violent Restraint(s) Outcome: Progressing
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
## 2024-10-10 NOTE — TOC Progression Note (Signed)
 Transition of Care [facility]) - Progression Note    Patient Details  Name: Brandon Frye MRN: [id number] Date of Birth: 07-Mar-1946  Transition of Care [facility] CM/SW Contact  Lashan Macias E Jayston Trevino, LCSW Phone Number: 10/10/2024, 3:50 PM  Clinical Narrative:    CSW sent secure email to [facility] Social Worker to inquire if patient would qualify for any aide services through the [place]. Medicare would not cover private duty aides.  Expected Discharge Plan: Home w Home Health Services Barriers to Discharge: Continued Medical Work up               Expected Discharge Plan and Services   Discharge Planning Services: CM Consult   Living arrangements for the past 2 months: Mobile Home Expected Discharge Date: 10/11/24                                     Social Drivers of Health (SDOH) Interventions SDOH Screenings   Food Insecurity: No Food Insecurity (10/06/2024)  Housing: Low Risk  (10/06/2024)  Transportation Needs: No Transportation Needs (10/06/2024)  Utilities: Not At Risk (10/06/2024)  Financial Resource Strain: Low Risk (12/18/2023)   Received from [facility]  Social Connections: Moderately Isolated (10/06/2024)  Stress: No Stress Concern Present (03/15/2021)   Received from [facility]  Tobacco Use: High Risk (10/05/2024)    Readmission Risk Interventions     No data to display

## 2024-10-11 LAB — CULTURE, RESPIRATORY W GRAM STAIN

## 2024-10-11 LAB — CBC
HCT: 30.2 % — ABNORMAL LOW (ref 39.0–52.0)
Hemoglobin: 9.8 g/dL — ABNORMAL LOW (ref 13.0–17.0)
MCH: 29 pg (ref 26.0–34.0)
MCHC: 32.5 g/dL (ref 30.0–36.0)
MCV: 89.3 fL (ref 80.0–100.0)
Platelets: 221 K/uL (ref 150–400)
RBC: 3.38 MIL/uL — ABNORMAL LOW (ref 4.22–5.81)
RDW: 13.7 % (ref 11.5–15.5)
WBC: 7.5 K/uL (ref 4.0–10.5)
nRBC: 0 % (ref 0.0–0.2)

## 2024-10-11 LAB — BASIC METABOLIC PANEL WITH GFR
Anion gap: 14 (ref 5–15)
BUN: 19 mg/dL (ref 8–23)
CO2: 25 mmol/L (ref 22–32)
Calcium: 9.2 mg/dL (ref 8.9–10.3)
Chloride: 98 mmol/L (ref 98–111)
Creatinine, Ser: 1.48 mg/dL — ABNORMAL HIGH (ref 0.61–1.24)
GFR, Estimated: 49 mL/min — ABNORMAL LOW (ref >60)
Glucose, Bld: 83 mg/dL (ref 70–99)
Potassium: 4.3 mmol/L (ref 3.5–5.1)
Sodium: 137 mmol/L (ref 135–145)

## 2024-10-11 MED ORDER — ADULT MULTIVITAMIN W/MINERALS CH
1.0000 | ORAL_TABLET | ORAL | Status: DC
  Administered 2024-10-11 – 2024-10-12 (×2): 1 via ORAL
  Filled 2024-10-11 (×2): qty 1

## 2024-10-11 MED ORDER — FERROUS SULFATE 325 (65 FE) MG PO TABS
325.0000 mg | ORAL_TABLET | Freq: Every day | ORAL | Status: DC
  Administered 2024-10-11 – 2024-10-12 (×2): 325 mg via ORAL
  Filled 2024-10-11 (×3): qty 1

## 2024-10-11 MED ORDER — CIPROFLOXACIN HCL 500 MG PO TABS
500.0000 mg | ORAL_TABLET | Freq: Two times a day (BID) | ORAL | Status: DC
  Administered 2024-10-11 – 2024-10-12 (×3): 500 mg via ORAL
  Filled 2024-10-11 (×4): qty 1
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
## 2024-10-11 NOTE — TOC Progression Note (Addendum)
 Transition of Care [facility]) - Progression Note    Patient Details  Name: Brandon Frye MRN: [id number] Date of Birth: 04/15/1946  [facility] CM/SW Contact  Margery Szostak, Mliss HERO, RN Phone Number: 10/11/2024,11:38am  Clinical Narrative:    Patient for likely discharge next 24-48h; HHPT/OT recommended and patient/significant other in agreement with follow up services.  Referral to [facility] for [facility] follow up.  Home health orders emailed to [facility] per protocol.  Will follow up in AM to see if patient will need home oxygen; recommend ambulatory sats in AM with PT or RN if possible.    Expected Discharge Plan: Home w Home Health Services Barriers to Discharge: Continued Medical Work up               Expected Discharge Plan and Services   Discharge Planning Services: CM Consult Post Acute Care Choice: Home Health Living arrangements for the past 2 months: Mobile Home Expected Discharge Date: 10/11/24                         HH Arranged: PT, OT HH Agency: [facility] Date [facility] Agency Contacted: 10/11/24 Time HH Agency Contacted: 1224 Representative spoke with at [facility] Agency: Darleene Gowda   Social Drivers of Health (SDOH) Interventions SDOH Screenings   Food Insecurity: No Food Insecurity (10/06/2024)  Housing: Low Risk  (10/06/2024)  Transportation Needs: No Transportation Needs (10/06/2024)  Utilities: Not At Risk (10/06/2024)  Financial Resource Strain: Low Risk (12/18/2023)   Received from [facility]  Social Connections: Moderately Isolated (10/06/2024)  Stress: No Stress Concern Present (03/15/2021)   Received from [facility]  Tobacco Use: High Risk (10/05/2024)    Readmission Risk Interventions     No data to display         Euphemia Lingerfelt W. Ragena Fiola, RN, BSN  Trauma/Neuro ICU Case Manager [phone]
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
## 2024-10-11 NOTE — Plan of Care (Signed)
  Problem: Education: Goal: Knowledge of General Education information will improve Description: Including pain rating scale, medication(s)/side effects and non-pharmacologic comfort measures Outcome: Progressing   Problem: Clinical Measurements: Goal: Ability to maintain clinical measurements within normal limits will improve Outcome: Progressing Goal: Respiratory complications will improve Outcome: Progressing Goal: Cardiovascular complication will be avoided Outcome: Progressing   Problem: Activity: Goal: Risk for activity intolerance will decrease Outcome: Progressing   Problem: Coping: Goal: Level of anxiety will decrease Outcome: Progressing   Problem: Elimination: Goal: Will not experience complications related to bowel motility Outcome: Progressing Goal: Will not experience complications related to urinary retention Outcome: Progressing   Problem: Pain Managment: Goal: General experience of comfort will improve and/or be controlled Outcome: Progressing   Problem: Safety: Goal: Non-violent Restraint(s) Outcome: Adequate for Discharge, discontinued

## 2024-10-12 ENCOUNTER — Other Ambulatory Visit (HOSPITAL_COMMUNITY): Payer: Self-pay

## 2024-10-12 MED ORDER — OXYCODONE HCL 5 MG PO TABS
5.0000 mg | ORAL_TABLET | ORAL | 0 refills | Status: AC | PRN
  Filled 2024-10-12: qty 15, 3d supply, fill #0

## 2024-10-12 MED ORDER — ACETAMINOPHEN 500 MG PO TABS
1000.0000 mg | ORAL_TABLET | Freq: Four times a day (QID) | ORAL | 1 refills | Status: AC
  Filled 2024-10-12: qty 120, 15d supply, fill #0

## 2024-10-12 MED ORDER — ORAL CARE MOUTH RINSE: 15.0000 mL | OROMUCOSAL | Status: DC | PRN

## 2024-10-12 MED ORDER — CIPROFLOXACIN HCL 500 MG PO TABS
500.0000 mg | ORAL_TABLET | Freq: Two times a day (BID) | ORAL | 1 refills | Status: AC
  Filled 2024-10-12: qty 10, 5d supply, fill #0

## 2024-10-12 NOTE — TOC Transition Note (Signed)
 Transition of Care [facility]) - Discharge Note   Patient Details  Name: Brandon Frye MRN: [id number] Date of Birth: Feb 22, 1946  [facility]) CM/SW Contact:  Jackelin Correia M, RN Phone Number: 10/12/2024, 11:50 AM   Clinical Narrative:    Patient medically stable for discharge home with significant other to provide needed assistance and [facility] services as previously arranged.  Patient will need home oxygen, as continues to drop sats with ambulation. Explained this to patient and he is agreeable.  Referral to [facility] with [facility] for home O2 set up.  Portable tank to be delivered to bedside prior to dc.  Patient understands that he will need to schedule a follow up appt with his PCP at the [place], as he will determine how long he will need the oxygen.     Final next level of care: Home w Home Health Services Barriers to Discharge: Barriers Resolved   Patient Goals and CMS Choice   CMS Medicare.gov Compare Post Acute Care list provided to:: Patient Choice offered to / list presented to : Patient (girlfriend)                          Discharge Plan and Services Additional resources added to the After Visit Summary for  NA   Discharge Planning Services: CM Consult Post Acute Care Choice: Home Health          DME Arranged: Oxygen DME Agency: [facility] Date DME Agency Contacted: 10/12/24 Time DME Agency Contacted: 1150 Representative spoke with at DME Agency: Ryan Breed HH Arranged: PT, OT, Nurse's Aide HH Agency: [facility] Date [facility] Agency Contacted: 10/11/24 Time HH Agency Contacted: 1224 Representative spoke with at [facility] Agency: Darleene Gowda  Social Drivers of Health (SDOH) Interventions SDOH Screenings   Food Insecurity: No Food Insecurity (10/06/2024)  Housing: Low Risk  (10/06/2024)  Transportation Needs: No Transportation Needs (10/06/2024)  Utilities: Not At Risk (10/06/2024)  Financial Resource Strain: Low Risk (12/18/2023)    Received from [facility]  Social Connections: Moderately Isolated (10/06/2024)  Stress: No Stress Concern Present (03/15/2021)   Received from [facility]  Tobacco Use: High Risk (10/05/2024)     Readmission Risk Interventions     No data to display         Toron Bowring W. Jorma Tassinari, RN, BSN  Trauma/Neuro ICU Case Manager [phone]
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
## 2024-10-12 NOTE — TOC Progression Note (Addendum)
 Transition of Care [facility]) - Progression Note    Patient Details  Name: Ramonte Mena MRN: [id number] Date of Birth: 1946-10-14  Transition of [facility]) CM/SW Contact  Breea Loncar E Al Gagen, LCSW Phone Number: 10/12/2024, 9:29 AM  Clinical Narrative:    Darleene with [facility] is able to add [facility] Aide to [facility] services. Patient will need to follow up with [facility] directly for longer term aide service eligibility.  Added contact info for patient's [facility] Social Worker to patient's AVS -- Kathlin Neamo. [phone] ext [phone]   Expected Discharge Plan: Home w Home Health Services Barriers to Discharge: Continued Medical Work up               Expected Discharge Plan and Services   Discharge Planning Services: CM Consult Post Acute Care Choice: Home Health Living arrangements for the past 2 months: Mobile Home Expected Discharge Date: 10/11/24                         HH Arranged: PT, OT HH Agency: [facility] Date [facility] Agency Contacted: 10/11/24 Time HH Agency Contacted: [id number] Representative spoke with at [facility] Agency: Darleene Gowda   Social Drivers of Health (SDOH) Interventions SDOH Screenings   Food Insecurity: No Food Insecurity (10/06/2024)  Housing: Low Risk  (10/06/2024)  Transportation Needs: No Transportation Needs (10/06/2024)  Utilities: Not At Risk (10/06/2024)  Financial Resource Strain: Low Risk (12/18/2023)   Received from [facility]  Social Connections: Moderately Isolated (10/06/2024)  Stress: No Stress Concern Present (03/15/2021)   Received from [facility]  Tobacco Use: High Risk (10/05/2024)    Readmission Risk Interventions     No data to display
